# Patient Record
Sex: Male | Born: 1945
Health system: Southern US, Community
[De-identification: ages and names within clinical notes are randomized; demographics above are authoritative.]

## PROBLEM LIST (undated history)

## (undated) DIAGNOSIS — I10 Essential (primary) hypertension: Secondary | ICD-10-CM

## (undated) DIAGNOSIS — K219 Gastro-esophageal reflux disease without esophagitis: Secondary | ICD-10-CM

## (undated) DIAGNOSIS — N182 Chronic kidney disease, stage 2 (mild): Secondary | ICD-10-CM

## (undated) DIAGNOSIS — I2510 Atherosclerotic heart disease of native coronary artery without angina pectoris: Secondary | ICD-10-CM

## (undated) DIAGNOSIS — M1990 Unspecified osteoarthritis, unspecified site: Secondary | ICD-10-CM

## (undated) DIAGNOSIS — N183 Chronic kidney disease, stage 3 unspecified: Secondary | ICD-10-CM

## (undated) DIAGNOSIS — D696 Thrombocytopenia, unspecified: Secondary | ICD-10-CM

## (undated) HISTORY — PX: OTHER SURGICAL HISTORY: SHX169

## (undated) HISTORY — DX: Chronic kidney disease, stage 3 unspecified: N18.30

## (undated) HISTORY — DX: Essential (primary) hypertension: I10

## (undated) HISTORY — PX: CARDIAC CATHETERIZATION: SHX172

---

## 2011-11-25 DIAGNOSIS — R079 Chest pain, unspecified: Secondary | ICD-10-CM

## 2013-05-19 ENCOUNTER — Inpatient Hospital Stay (HOSPITAL_COMMUNITY)
Admission: EM | Admit: 2013-05-19 | Discharge: 2013-05-23 | DRG: 247 | Disposition: A | Discharge status: Home / Self Care | Payer: Medicare Other | Source: Other Acute Inpatient Hospital | Attending: Cardiovascular Disease | Admitting: Cardiovascular Disease

## 2013-05-19 ENCOUNTER — Encounter (HOSPITAL_COMMUNITY): Payer: Self-pay | Admitting: Cardiology

## 2013-05-19 ENCOUNTER — Other Ambulatory Visit: Payer: Self-pay

## 2013-05-19 ENCOUNTER — Encounter (HOSPITAL_COMMUNITY)
Admission: EM | Disposition: A | Discharge status: Home / Self Care | Payer: Medicare Other | Source: Other Acute Inpatient Hospital | Attending: Cardiovascular Disease

## 2013-05-19 ENCOUNTER — Inpatient Hospital Stay (HOSPITAL_COMMUNITY): Payer: Medicare Other

## 2013-05-19 DIAGNOSIS — Z87891 Personal history of nicotine dependence: Secondary | ICD-10-CM

## 2013-05-19 DIAGNOSIS — E782 Mixed hyperlipidemia: Secondary | ICD-10-CM

## 2013-05-19 DIAGNOSIS — N182 Chronic kidney disease, stage 2 (mild): Secondary | ICD-10-CM

## 2013-05-19 DIAGNOSIS — I2111 ST elevation (STEMI) myocardial infarction involving right coronary artery: Secondary | ICD-10-CM

## 2013-05-19 DIAGNOSIS — I129 Hypertensive chronic kidney disease with stage 1 through stage 4 chronic kidney disease, or unspecified chronic kidney disease: Secondary | ICD-10-CM | POA: Diagnosis present

## 2013-05-19 DIAGNOSIS — I213 ST elevation (STEMI) myocardial infarction of unspecified site: Secondary | ICD-10-CM

## 2013-05-19 DIAGNOSIS — N183 Chronic kidney disease, stage 3 unspecified: Secondary | ICD-10-CM | POA: Diagnosis present

## 2013-05-19 DIAGNOSIS — I2119 ST elevation (STEMI) myocardial infarction involving other coronary artery of inferior wall: Principal | ICD-10-CM | POA: Diagnosis present

## 2013-05-19 DIAGNOSIS — Z955 Presence of coronary angioplasty implant and graft: Secondary | ICD-10-CM

## 2013-05-19 DIAGNOSIS — D696 Thrombocytopenia, unspecified: Secondary | ICD-10-CM | POA: Diagnosis not present

## 2013-05-19 DIAGNOSIS — I10 Essential (primary) hypertension: Secondary | ICD-10-CM

## 2013-05-19 DIAGNOSIS — I959 Hypotension, unspecified: Secondary | ICD-10-CM | POA: Diagnosis present

## 2013-05-19 DIAGNOSIS — Z96649 Presence of unspecified artificial hip joint: Secondary | ICD-10-CM

## 2013-05-19 DIAGNOSIS — I2510 Atherosclerotic heart disease of native coronary artery without angina pectoris: Secondary | ICD-10-CM

## 2013-05-19 DIAGNOSIS — E785 Hyperlipidemia, unspecified: Secondary | ICD-10-CM | POA: Diagnosis present

## 2013-05-19 DIAGNOSIS — I252 Old myocardial infarction: Secondary | ICD-10-CM

## 2013-05-19 HISTORY — PX: LEFT HEART CATHETERIZATION WITH CORONARY ANGIOGRAM: SHX5451

## 2013-05-19 HISTORY — PX: CORONARY STENT PLACEMENT: SHX1402

## 2013-05-19 HISTORY — DX: Atherosclerotic heart disease of native coronary artery without angina pectoris: I25.10

## 2013-05-19 HISTORY — DX: Thrombocytopenia, unspecified: D69.6

## 2013-05-19 HISTORY — DX: Chronic kidney disease, stage 2 (mild): N18.2

## 2013-05-19 LAB — COMPREHENSIVE METABOLIC PANEL
ALT: 15 U/L (ref 0–53)
AST: 13 U/L (ref 0–37)
Albumin: 2.2 g/dL — ABNORMAL LOW (ref 3.5–5.2)
Alkaline Phosphatase: 34 U/L — ABNORMAL LOW (ref 39–117)
BUN: 18 mg/dL (ref 6–23)
CHLORIDE: 87 meq/L — AB (ref 96–112)
CO2: 19 mEq/L (ref 19–32)
CREATININE: 1.29 mg/dL (ref 0.50–1.35)
Calcium: 6.6 mg/dL — ABNORMAL LOW (ref 8.4–10.5)
GFR calc Af Amer: 65 mL/min — ABNORMAL LOW (ref >90)
GFR calc non Af Amer: 56 mL/min — ABNORMAL LOW (ref >90)
Glucose, Bld: 99 mg/dL (ref 70–99)
POTASSIUM: 3.7 meq/L (ref 3.7–5.3)
Sodium: 118 mEq/L — CL (ref 137–147)
TOTAL PROTEIN: 4.3 g/dL — AB (ref 6.0–8.3)
Total Bilirubin: 0.5 mg/dL (ref 0.3–1.2)

## 2013-05-19 LAB — PROTIME-INR
INR: 8.85 — AB (ref 0.00–1.49)
INR: 2.44 — AB (ref 0.00–1.49)
Prothrombin Time: 68.7 seconds — ABNORMAL HIGH (ref 11.6–15.2)
Prothrombin Time: 25.7 seconds — ABNORMAL HIGH (ref 11.6–15.2)

## 2013-05-19 LAB — BASIC METABOLIC PANEL
BUN: 17 mg/dL (ref 6–23)
CALCIUM: 8 mg/dL — AB (ref 8.4–10.5)
CHLORIDE: 99 meq/L (ref 96–112)
CO2: 25 mEq/L (ref 19–32)
CREATININE: 1.44 mg/dL — AB (ref 0.50–1.35)
GFR calc non Af Amer: 49 mL/min — ABNORMAL LOW (ref >90)
GFR, EST AFRICAN AMERICAN: 57 mL/min — AB (ref >90)
Glucose, Bld: 121 mg/dL — ABNORMAL HIGH (ref 70–99)
Potassium: 4.3 mEq/L (ref 3.7–5.3)
Sodium: 136 mEq/L — ABNORMAL LOW (ref 137–147)

## 2013-05-19 LAB — CBC
HEMATOCRIT: 37.2 % — AB (ref 39.0–52.0)
Hemoglobin: 12.9 g/dL — ABNORMAL LOW (ref 13.0–17.0)
MCH: 30.9 pg (ref 26.0–34.0)
MCHC: 34.7 g/dL (ref 30.0–36.0)
MCV: 89.2 fL (ref 78.0–100.0)
PLATELETS: 96 10*3/uL — AB (ref 150–400)
RBC: 4.17 MIL/uL — ABNORMAL LOW (ref 4.22–5.81)
RDW: 13.8 % (ref 11.5–15.5)
WBC: 9.6 10*3/uL (ref 4.0–10.5)

## 2013-05-19 LAB — APTT

## 2013-05-19 LAB — CK TOTAL AND CKMB (NOT AT ARMC)
CK TOTAL: 184 U/L (ref 7–232)
CK, MB: 3.2 ng/mL (ref 0.3–4.0)
Relative Index: 1.7 (ref 0.0–2.5)

## 2013-05-19 LAB — MAGNESIUM: Magnesium: 2.1 mg/dL (ref 1.5–2.5)

## 2013-05-19 LAB — HEMOGLOBIN A1C
HEMOGLOBIN A1C: 6.1 % — AB (ref <5.7)
MEAN PLASMA GLUCOSE: 128 mg/dL — AB (ref <117)

## 2013-05-19 LAB — TROPONIN I
Troponin I: <0.3 ng/mL (ref <0.30)
Troponin I: 2.46 ng/mL (ref <0.30)

## 2013-05-19 SURGERY — LEFT HEART CATHETERIZATION WITH CORONARY ANGIOGRAM
Anesthesia: LOCAL

## 2013-05-19 MED ORDER — ZOLPIDEM TARTRATE 5 MG PO TABS
5.0000 mg | ORAL_TABLET | Freq: Once | ORAL | Status: AC
  Administered 2013-05-19: 5 mg via ORAL
  Filled 2013-05-19: qty 1

## 2013-05-19 MED ORDER — ATORVASTATIN CALCIUM 80 MG PO TABS
80.0000 mg | ORAL_TABLET | Freq: Every day | ORAL | Status: DC
  Administered 2013-05-19: 80 mg via ORAL
  Filled 2013-05-19 (×2): qty 1

## 2013-05-19 MED ORDER — SODIUM CHLORIDE 0.9 % IV SOLN
0.2500 mg/kg/h | INTRAVENOUS | Status: DC
  Filled 2013-05-19: qty 250

## 2013-05-19 MED ORDER — TICAGRELOR 90 MG PO TABS
90.0000 mg | ORAL_TABLET | Freq: Two times a day (BID) | ORAL | Status: DC
  Administered 2013-05-19 – 2013-05-20 (×3): 90 mg via ORAL
  Filled 2013-05-19 (×5): qty 1

## 2013-05-19 MED ORDER — MIDAZOLAM HCL 2 MG/2ML IJ SOLN
INTRAMUSCULAR | Status: AC
  Filled 2013-05-19: qty 2

## 2013-05-19 MED ORDER — ASPIRIN EC 81 MG PO TBEC
81.0000 mg | DELAYED_RELEASE_TABLET | Freq: Every day | ORAL | Status: DC
  Administered 2013-05-20: 81 mg via ORAL
  Filled 2013-05-19 (×3): qty 1

## 2013-05-19 MED ORDER — HEPARIN (PORCINE) IN NACL 2-0.9 UNIT/ML-% IJ SOLN
INTRAMUSCULAR | Status: AC
  Filled 2013-05-19: qty 1500

## 2013-05-19 MED ORDER — SODIUM CHLORIDE 0.9 % IV SOLN
INTRAVENOUS | Status: DC
  Administered 2013-05-19: 150 mL/h via INTRAVENOUS
  Administered 2013-05-20: 04:00:00 via INTRAVENOUS

## 2013-05-19 MED ORDER — METOPROLOL TARTRATE 12.5 MG HALF TABLET
12.5000 mg | ORAL_TABLET | Freq: Two times a day (BID) | ORAL | Status: DC
  Administered 2013-05-19 – 2013-05-23 (×8): 12.5 mg via ORAL
  Filled 2013-05-19 (×10): qty 1

## 2013-05-19 MED ORDER — LIDOCAINE HCL (PF) 1 % IJ SOLN
INTRAMUSCULAR | Status: AC
  Filled 2013-05-19: qty 30

## 2013-05-19 MED ORDER — BIVALIRUDIN 250 MG IV SOLR
INTRAVENOUS | Status: AC
  Filled 2013-05-19: qty 250

## 2013-05-19 MED ORDER — NITROGLYCERIN 0.2 MG/ML ON CALL CATH LAB
INTRAVENOUS | Status: AC
  Filled 2013-05-19: qty 1

## 2013-05-19 MED ORDER — TICAGRELOR 90 MG PO TABS
ORAL_TABLET | ORAL | Status: AC
  Administered 2013-05-19: 90 mg via ORAL
  Filled 2013-05-19: qty 2

## 2013-05-19 MED ORDER — FENTANYL CITRATE 0.05 MG/ML IJ SOLN
INTRAMUSCULAR | Status: AC
  Filled 2013-05-19: qty 2

## 2013-05-19 MED ORDER — SODIUM CHLORIDE 0.9 % IV SOLN: 0.2500 mg/kg/h | INTRAVENOUS | Status: AC

## 2013-05-19 MED ORDER — POTASSIUM CHLORIDE 10 MEQ/100ML IV SOLN
INTRAVENOUS | Status: AC
  Filled 2013-05-19: qty 100

## 2013-05-19 MED ORDER — ATROPINE SULFATE 0.1 MG/ML IJ SOLN
INTRAMUSCULAR | Status: AC
  Filled 2013-05-19: qty 10

## 2013-05-19 NOTE — Progress Notes (Signed)
Per MD order and protocol, pt's right femoral arterial sheath removed at 2136. Manual pressure held for 25 minutes with assistance from Hillery Aldo, [place]. Pt tolerated well and vital signs remained stable throughout procedure. Distal pulses +1 before and after sheath-pull. Very minimal oozing observed from site at the beginning; EBL <58mL. Pressure dressing applied and pt given instructions about 4-hr bedrest period, as well as applying pressure to the site if the need arises to sneeze, cough, laugh, etc. Pt verbalized understanding of instructions. Dressing is clean, dry and intact. Will continue to monitor.

## 2013-05-19 NOTE — H&P (Signed)
Keith Keith is an 68 y.o. male.    Primary Cardiologist: [facility] Dr. Claiborne Billings PCP [facility] Dr. Estill Bamberg;  Dr Brigitte Pulse in [place]  Chief Complaint: chest pressure  HPI: 68 year old AAM with no prior cardiac hx but positive for HTN.  Was in his usual state of good health with only abnormality of recent Lt shoulder discomfort, to axilla, thought to be bursitis.  Today after shower at 0930 he had mid sternal chest pressure without radiation associated with nausea, retching, and diaphoresis, no SOB.  His wife took him to [facility] and he was found to have STEMI of inf wall with acute ST segment elevation.  IV Heparin was given and pt transported by EMS to [facility] for emergent cardiac cath.     Past Medical History  Diagnosis Date  . HTN (hypertension)     Past Surgical History  Procedure Laterality Date  . Bil hip replacements      Family History  Problem Relation Age of Onset  . Aneurysm Mother   . Heart disease Sister   . Diabetes Sister   . Diabetes Sister    Social History:  reports that he quit smoking about 10 years ago. He has never used smokeless tobacco. He reports that he drinks about 0.6 ounces of alcohol per week. He reports that he does not use illicit drugs. Married with 3 children and 2 grandchildren and 1 great grand child. He works with [employer].  He golfs but walks beside the cart.  Allergies: No Known Allergies  OUTPATIENT Medications: Lisinopril hctz Unsure of name of others.  Results for orders placed during the hospital encounter of 05/19/13 (from the past 48 hour(s))  CBC     Status: Abnormal   Collection Time    05/19/13  1:28 PM      Result Value Ref Range   WBC 9.6  4.0 - 10.5 K/uL   RBC 4.17 (*) 4.22 - 5.81 MIL/uL   Hemoglobin 12.9 (*) 13.0 - 17.0 g/dL   HCT 37.2 (*) 39.0 - 52.0 %   MCV 89.2  78.0 - 100.0 fL   MCH 30.9  26.0 - 34.0 pg   MCHC 34.7  30.0 - 36.0 g/dL   RDW 13.8  11.5 - 15.5 %   Platelets 96 (*) 150 - 400 K/uL   Comment: PLATELET COUNT CONFIRMED BY SMEAR  COMPREHENSIVE METABOLIC PANEL     Status: Abnormal   Collection Time    05/19/13  1:28 PM      Result Value Ref Range   Sodium 118 (*) 137 - 147 mEq/L   Comment: CRITICAL RESULT CALLED TO, READ BACK BY AND VERIFIED WITH:     BARLOW A.,RN 05/19/13 1436 BY JONESJ   Potassium 3.7  3.7 - 5.3 mEq/L   Chloride 87 (*) 96 - 112 mEq/L   CO2 19  19 - 32 mEq/L   Glucose, Bld 99  70 - 99 mg/dL   BUN 18  6 - 23 mg/dL   Creatinine, Ser 1.29  0.50 - 1.35 mg/dL   Calcium 6.6 (*) 8.4 - 10.5 mg/dL   Total Protein 4.3 (*) 6.0 - 8.3 g/dL   Albumin 2.2 (*) 3.5 - 5.2 g/dL   AST 13  0 - 37 U/L   ALT 15  0 - 53 U/L   Alkaline Phosphatase 34 (*) 39 - 117 U/L   Total Bilirubin 0.5  0.3 - 1.2 mg/dL  GFR calc non Af Amer 56 (*) >90 mL/min   GFR calc Af Amer 65 (*) >90 mL/min   Comment: (NOTE)     The eGFR has been calculated using the CKD EPI equation.     This calculation has not been validated in all clinical situations.     eGFR's persistently <90 mL/min signify possible Chronic Kidney     Disease.  PROTIME-INR     Status: Abnormal   Collection Time    05/19/13  1:28 PM      Result Value Ref Range   Prothrombin Time 68.7 (*) 11.6 - 15.2 seconds   INR 8.85 (*) 0.00 - 1.49   Comment: REPEATED TO VERIFY     CRITICAL RESULT CALLED TO, READ BACK BY AND VERIFIED WITH:     SHELDON D,RN 1506 05/19/13 SCALES H  APTT     Status: Abnormal   Collection Time    05/19/13  1:28 PM      Result Value Ref Range   aPTT >200 (*) 24 - 37 seconds   Comment:            IF BASELINE aPTT IS ELEVATED,     SUGGEST PATIENT RISK ASSESSMENT     BE USED TO DETERMINE APPROPRIATE     ANTICOAGULANT THERAPY.     CRITICAL RESULT CALLED TO, READ BACK BY AND VERIFIED WITH:     SHELDON D,RN 1506 05/19/13 SCALES H  CK TOTAL AND CKMB     Status: None   Collection Time    05/19/13  1:28 PM      Result Value Ref Range   Total CK 184  7 - 232 U/L   CK, MB 3.2  0.3 - 4.0 ng/mL   Relative  Index 1.7  0.0 - 2.5  TROPONIN I     Status: None   Collection Time    05/19/13  1:28 PM      Result Value Ref Range   Troponin I <0.30  <0.30 ng/mL   Comment:            Due to the release kinetics of cTnI,     a negative result within the first hours     of the onset of symptoms does not rule out     myocardial infarction with certainty.     If myocardial infarction is still suspected,     repeat the test at appropriate intervals.   No results found.  ROS: General:no colds or fevers, no weight changes Skin:no rashes or ulcers HEENT:no blurred vision, no congestion CV:see HPI PUL:see HPI GI:no diarrhea constipation or melena, no indigestion GU:no hematuria, no dysuria MS:no joint pain, no claudication Neuro:no syncope, no lightheadedness Endo:no diabetes, no thyroid disease  Blood pressure 102/62, pulse 82, resp. rate 14, height '5\' 5"'  (1.651 m), weight 158 lb 11.7 oz (72 kg), SpO2 100.00%. PE: General:Pleasant affect, NAD Skin:Warm and dry, brisk capillary refill HEENT:normocephalic, sclera clear, mucus membranes moist Neck:supple, no JVD, no bruits, adenopathy  Heart:S1S2 RRR without murmur, gallup, rub or click Lungs:clear without rales, rhonchi, or wheezes CVE:LFYB, non tender, + BS, do not palpate liver spleen or masses Ext:no lower ext edema, 1+ pedal pulses, 2+ radial pulses Neuro:alert and oriented, MAE, follows commands, + facial symmetry    Assessment/Plan Principal Problem:   ST elevation myocardial infarction (STEMI) involving right coronary artery with complication Active Problems:   HTN (hypertension)   CAD (coronary artery disease)  PLAN:  Admit to CCU after procedure.  Add BB con't ace as BP allows.  Check chol.  Add statin.  Serial troponin  [facility] Nurse Practitioner Certified [place] Pager [phone] or after 5pm or weekends call [phone] 05/19/2013, 3:57 PM   Patient seen and examined. Agree with assessment and  plan. 68 yo AAM who has a history of hypertension and was getting ready to play golf today when he developed tachypnea and substernal chest pressure. He presented to [facility] and was found to have inferior ST segment elevation of 2-3 mm. He was mildly hypotensive. Right-sided ECG leads did not suggest RV infarction pattern. He was treated with normal saline fluid and was given heparin 5000 units prior to transport. Upon arrival to [facility] today, he still has chest pain but this has improved. He's taken acutely to the catheterization laboratory for acute catheterization with suspicion for probable high-grade RCA disease in the etiology of his ST segment elevation inferior myocardial infarction   Troy Sine, MD, [facility] 05/19/2013 4:32 PM

## 2013-05-19 NOTE — CV Procedure (Addendum)
Keith Keith is a 68 y.o. male    [id number]  [id number] LOCATION:  FACILITY: [facility]  PHYSICIAN: Lennette Bihari, MD, [facility] 1945-07-28   DATE OF PROCEDURE:  05/19/2013    EMERGENCYCARDIAC CATHETERIZATION/PERCUTANEOUS CORONARY INTERVENTION    HISTORY: Keith Keith is a 68 year old African American male who has a history of hypertension and BPH. He developed new onset chest pain today and presented to [facility] for ECG showed acute inferior ST segment elevation.  A code STEMI was called, heparin 5000 untits was administered and he was transported to [facility] for emergent cardiac catheterization.   PROCEDURE: Emergent cardiac catheterization, coronary angiography, as well as cardiac intervention with PTCA at 4 sites of his RCA with tandem stenting and left ventriculography.   Upon arrival to the cardiac catheterization laboratory, the patient was still having residual chest pain but this had improved following administration of heparin prior to transfer. His right femoral artery was punctured anteriorly and a 6 French sheath was inserted without difficulty. First at 2 mg and fentanyl 50 mcg were administered for conscious sedation. A J40 diagnostic catheter was used for selective angiography into the left coronary system. Initially, a 6 [place] 4 guide was inserted but due to significant catheter-induced dampening of pressure, this was exchanged for a 6 [place] JR 4 with side hole guiding catheter. The demonstration of multiple high grade stenoses in the RCA the decision was made to attempt emergent percutaneous coronary intervention. Bivalirudin bolus plus infusion was administered. The patient received 180 mg of oral Brilinta. A Prowater wire was advanced down the RCA into the posterolateral vessel. A 2.0x15 mm sprinter legend balloon was inserted. As to the distal RCA and multiple inflations were made at 3 distinct sites in the in the distal region, just after the acute margin, and  in the mid ICA proximal to the acute margin. Tandem stenting was performed with insertion of a 2.25x32 mm Promus Premier  DES stent inserted distally and a second stent 2.25x20 mm Promus Premier DES stent was inserted and tandem fashion extending to the mid RCA. Post-stent dilatation was done using an Empira Trent 2.5 x 20 mm balloon with tapering from 2.4 mm in the mid RCA tooth 2.32 mm and the most distal aspect of the second stent. Scout angiography confirmed an excellent angiographic result. The balloon wire and RCA guide were removed and a 6 French pigtail catheter was inserted. Left ventriculography was performed using 25 cc of Omnipaque contrast at a flow rate of 12 cc per second. Throughout the procedure, the patient was hypotensive and he did receive fluid bolus. He also was started on KCL 10 mEq infusion when he was found to be hypokalemic with potassium of 3.0 The arterial sheath was sutured in place. Plans are for continuation of Angiomax for 4 hours post procedure.  Hhe left the catheterization laboratory chest pain-free with stable hemodynamics.   HEMODYNAMICS:   Central Aorta: 85/60   Left Ventricle: 85/10  ANGIOGRAPHY:  The left main coronary artery was angiographically normal vessel which trifurcated into a moderate size LAD, a diminutive ramus intermediate vessel, and a moderate-sized circumflex vessel.  The LAD gave rise to a proximal diagonal vessel that had 80% ostial stenosis. The superior branch of this diagonal vessel had diffuse MID narrowing of 89% and a inferior branch of this diagonal vessel had 60% narrowing. There was diffuse 60-70% narrowing in the LAD after the diagonal takeoff. The LAD extended to and wrapped around the LV apex.  The  ramus intermediate vessel was a small vessel that had diffuse 95% ostial narrowing.  The circumflex vessel gave rise to 2 major marginal vessels. The first marginal vessel had an inferior branch which was very small caliber but had 90%  stenosis.  Right carotid artery was a small to moderate size vessel that had proximal narrowing of 30% beyond the initial bend. In the mid region there was 70% stenosis followed by 90% stenosis before the acute margin, and just after the acute margin the vessel is 95-99% stenosed followed by 80-90% stenosed before the PDA.  Following percutaneous coronary intervention with PTCA diffusely from the mid RCA distally and with ultimate tandem stenting with 2.25x32 mm and 2.25x20 mm Promus Premier DES stents, post dilated with stent taper from 2.4 mm at the mid segment to 2.32 mm distally, the entire region was reduced to 0%. There was brisk TIMI-3 flow. There was no evidence for dissection. The door to balloon time was 30 minutes.  Left ventriculography revealed normal systolic function with an ejection fraction of at least 60-65% without focal segmental wall motion abnormalities.  IMPRESSION:  Acute inferior ST segment elevation myocardial infarction secondary to high grade diffuse mid distal RCA disease with subtotal RCA stenosis.  Concomitant coronary obstructive disease involving the LAD with 80% focal ostial stenosis in the first diagonal branch with diffuse 80-90% stenosis in the superior aspect of this diagonal branch and 60% stenosis in the inferior portion of this branch followed by 60-70% LAD stenoses after the first out of vessel; 95% stenosis of the very small diminutive ramus intermediate vessel; and 90% stenosis in an inferior branch of the OM1 vessel the left circumflex coronary artery.  Successful acute intervention to the RCA with insertion of Promus Premier 2.25x32 and 2.25x20 mm stents postdilated to 2.4 mm down to 2.32 mm from the mid segment distally with excellent door to balloon time of 30 minutes and stenoses being reduced to 0%.  Normal acute LV function with an ejection fraction of 60-65% without focal segmental wall motion abnormalities.    RECOMMENDATION:   Keith Keith  developed new onset chest pain today associated with 2-3 mm of inferior ST segment elevation. [facility] and [facility] his chest pain was still present but improved. Initial angiography confirmed high-grade multiple stenoses in the RCA which is the culprit vessel with subtotal stenosis but with TIMI-3 flow. Following successful emergent revascularization, only function appears fairly normal. He does have concomitant CAD. He will  be started on medical therapy.   Lennette Biharihomas A. Kelly, MD, [facility] 05/19/2013 2:09 PM

## 2013-05-20 ENCOUNTER — Encounter (HOSPITAL_COMMUNITY): Payer: Self-pay | Admitting: Interventional Cardiology

## 2013-05-20 DIAGNOSIS — D696 Thrombocytopenia, unspecified: Secondary | ICD-10-CM | POA: Diagnosis present

## 2013-05-20 DIAGNOSIS — E782 Mixed hyperlipidemia: Secondary | ICD-10-CM

## 2013-05-20 LAB — CBC
HCT: 40.7 % (ref 39.0–52.0)
Hemoglobin: 14 g/dL (ref 13.0–17.0)
MCH: 31 pg (ref 26.0–34.0)
MCHC: 34.4 g/dL (ref 30.0–36.0)
MCV: 90.2 fL (ref 78.0–100.0)
PLATELETS: 104 10*3/uL — AB (ref 150–400)
RBC: 4.51 MIL/uL (ref 4.22–5.81)
RDW: 14.2 % (ref 11.5–15.5)
WBC: 7.5 10*3/uL (ref 4.0–10.5)

## 2013-05-20 LAB — PROTIME-INR
INR: 1.11 (ref 0.00–1.49)
PROTHROMBIN TIME: 14.1 s (ref 11.6–15.2)

## 2013-05-20 LAB — URINALYSIS, ROUTINE W REFLEX MICROSCOPIC
Bilirubin Urine: NEGATIVE
GLUCOSE, UA: NEGATIVE mg/dL
HGB URINE DIPSTICK: NEGATIVE
KETONES UR: NEGATIVE mg/dL
Leukocytes, UA: NEGATIVE
Nitrite: NEGATIVE
PH: 6 (ref 5.0–8.0)
Protein, ur: NEGATIVE mg/dL
Specific Gravity, Urine: 1.018 (ref 1.005–1.030)
Urobilinogen, UA: 1 mg/dL (ref 0.0–1.0)

## 2013-05-20 LAB — LIPID PANEL
CHOL/HDL RATIO: 3 ratio
CHOLESTEROL: 128 mg/dL (ref 0–200)
HDL: 42 mg/dL (ref >39)
LDL Cholesterol: 68 mg/dL (ref 0–99)
Triglycerides: 92 mg/dL (ref <150)
VLDL: 18 mg/dL (ref 0–40)

## 2013-05-20 LAB — COMPREHENSIVE METABOLIC PANEL
ALT: 18 U/L (ref 0–53)
AST: 22 U/L (ref 0–37)
Albumin: 2.7 g/dL — ABNORMAL LOW (ref 3.5–5.2)
Alkaline Phosphatase: 43 U/L (ref 39–117)
BUN: 16 mg/dL (ref 6–23)
CALCIUM: 8.1 mg/dL — AB (ref 8.4–10.5)
CO2: 25 mEq/L (ref 19–32)
CREATININE: 1.47 mg/dL — AB (ref 0.50–1.35)
Chloride: 101 mEq/L (ref 96–112)
GFR calc Af Amer: 56 mL/min — ABNORMAL LOW (ref >90)
GFR, EST NON AFRICAN AMERICAN: 48 mL/min — AB (ref >90)
Glucose, Bld: 101 mg/dL — ABNORMAL HIGH (ref 70–99)
Potassium: 4.4 mEq/L (ref 3.7–5.3)
SODIUM: 138 meq/L (ref 137–147)
Total Bilirubin: 0.6 mg/dL (ref 0.3–1.2)
Total Protein: 5.6 g/dL — ABNORMAL LOW (ref 6.0–8.3)

## 2013-05-20 LAB — POCT I-STAT, CHEM 8
BUN: 18 mg/dL (ref 6–23)
CREATININE: 0.7 mg/dL (ref 0.50–1.35)
Calcium, Ion: 0.92 mmol/L — ABNORMAL LOW (ref 1.13–1.30)
Chloride: 71 mEq/L — ABNORMAL LOW (ref 96–112)
GLUCOSE: 62 mg/dL — AB (ref 70–99)
HCT: 40 % (ref 39.0–52.0)
Hemoglobin: 13.6 g/dL (ref 13.0–17.0)
Potassium: 2.9 mEq/L — CL (ref 3.7–5.3)
SODIUM: 105 meq/L — AB (ref 137–147)
TCO2: 16 mmol/L (ref 0–100)

## 2013-05-20 LAB — HEMOGLOBIN A1C
Hgb A1c MFr Bld: 6 % — ABNORMAL HIGH (ref <5.7)
Mean Plasma Glucose: 126 mg/dL — ABNORMAL HIGH (ref <117)

## 2013-05-20 LAB — POCT ACTIVATED CLOTTING TIME: Activated Clotting Time: 692 seconds

## 2013-05-20 LAB — TROPONIN I: Troponin I: 2.8 ng/mL (ref <0.30)

## 2013-05-20 LAB — TSH
TSH: 0.575 u[IU]/mL (ref 0.350–4.500)
TSH: 0.326 u[IU]/mL — AB (ref 0.350–4.500)

## 2013-05-20 MED ORDER — ATORVASTATIN CALCIUM 40 MG PO TABS
40.0000 mg | ORAL_TABLET | Freq: Every day | ORAL | Status: DC
  Administered 2013-05-20 – 2013-05-22 (×3): 40 mg via ORAL
  Filled 2013-05-20 (×5): qty 1

## 2013-05-20 MED ORDER — ACETAMINOPHEN 325 MG PO TABS
650.0000 mg | ORAL_TABLET | Freq: Four times a day (QID) | ORAL | Status: DC | PRN
  Administered 2013-05-20 – 2013-05-21 (×2): 650 mg via ORAL
  Filled 2013-05-20 (×2): qty 2

## 2013-05-20 MED FILL — Sodium Chloride IV Soln 0.9%: INTRAVENOUS | Qty: 50 | Status: AC

## 2013-05-20 NOTE — Progress Notes (Addendum)
SUBJECTIVE:  No chest pain.  Feels well.  Wants to go home.  OBJECTIVE:   Vitals:   Filed Vitals:   05/20/13 0500 05/20/13 0600 05/20/13 0700 05/20/13 0800  BP: 128/63 98/49 112/68 113/60  Pulse: 84 81 70 81  Temp:      TempSrc:      Resp: 17 15 13 15   Height:      Weight:      SpO2: 100% 99% 100% 99%   I&O's:   Intake/Output Summary (Last 24 hours) at 05/20/13 0910 Last data filed at 05/20/13 0800  Gross per 24 hour  Intake   3074 ml  Output   2625 ml  Net    449 ml   TELEMETRY: Reviewed telemetry pt in NSR:     PHYSICAL EXAM General: Well developed, well nourished, in no acute distress Head:    Normal cephalic and atramatic  Lungs:   Clear bilaterally to auscultation and percussion. Heart:   HRRR S1 S2 No JVD.  No abdominal bruits. No femoral bruits. Abdomen:  abdomen soft and non-tender Msk:  Back normal, . Normal strength and tone for age. Extremities:  No edema.  Right PT 2+, no right groin hematoma Neuro: Alert and oriented X 3. Psych:  Normal affect, responds appropriately   LABS: Basic Metabolic Panel:  Recent Labs  16/11/9601/29/15 1328 05/19/13 1707 05/20/13 0047  NA 118* 136* 138  K 3.7 4.3 4.4  CL 87* 99 101  CO2 19 25 25   GLUCOSE 99 121* 101*  BUN 18 17 16   CREATININE 1.29 1.44* 1.47*  CALCIUM 6.6* 8.0* 8.1*  MG  --  2.1  --    Liver Function Tests:  Recent Labs  05/19/13 1328 05/20/13 0047  AST 13 22  ALT 15 18  ALKPHOS 34* 43  BILITOT 0.5 0.6  PROT 4.3* 5.6*  ALBUMIN 2.2* 2.7*   No results found for this basename: LIPASE, AMYLASE,  in the last 72 hours CBC:  Recent Labs  05/19/13 1328 05/20/13 0047  WBC 9.6 7.5  HGB 12.9* 14.0  HCT 37.2* 40.7  MCV 89.2 90.2  PLT 96* 104*   Cardiac Enzymes:  Recent Labs  05/19/13 1328 05/19/13 2050 05/20/13 0047  CKTOTAL 184  --   --   CKMB 3.2  --   --   TROPONINI <0.30 2.46* 2.80*   BNP: No components found with this basename: POCBNP,  D-Dimer: No results found for this  basename: DDIMER,  in the last 72 hours Hemoglobin A1C:  Recent Labs  05/19/13 1707  HGBA1C 6.0*   Fasting Lipid Panel:  Recent Labs  05/20/13 0047  CHOL 128  HDL 42  LDLCALC 68  TRIG 92  CHOLHDL 3.0   Thyroid Function Tests:  Recent Labs  05/19/13 1707  TSH 0.326*   Anemia Panel: No results found for this basename: VITAMINB12, FOLATE, FERRITIN, TIBC, IRON, RETICCTPCT,  in the last 72 hours Coag Panel:   Lab Results  Component Value Date   INR 1.11 05/20/2013   INR 2.44* 05/19/2013   INR 8.85* 05/19/2013    RADIOLOGY: No results found.    ASSESSMENT: Acute inferior MI; residual moderate CAD; HTN  PLAN:  Stressed the importance fo DAPT with aspirin and brilinta.  Medical therapy per Dr. Tresa EndoKelly for residual CAD.  Not adding any further antianginal therapy for now since he is asymptomatic.  Walk with cardiac rehab.  HTN: OK to stay on current home BP meds. Was on lisinopril and lopressor  at home per the family.  Transfer to telel today.  Possible d/c tomorrow or the day after depending on his clinical course.  Hyperlipidemia: Decrease atorva to 40 mg daily.  LDL 68.   Thrombocytopenia: watch for bleeding.  Corky Crafts., MD  05/20/2013  9:10 AM

## 2013-05-20 NOTE — Care Management Note (Addendum)
    Page 1 of 1   05/23/2013     10:11:06 AM   CARE MANAGEMENT NOTE 05/23/2013  Patient:  Keith Keith,Keith Keith   Account Number:  [id number]  Date Initiated:  05/20/2013  Documentation initiated by:  Junius Creamer  Subjective/Objective Assessment:   adm w stemi     Action/Plan:   lives w spouse//Benefits check for Brilinta   Anticipated DC Date:  05/23/2013   Anticipated DC Plan:  HOME/SELF CARE      DC Planning Services  CM consult  Medication Assistance      Choice offered to / List presented to:             Status of service:  Completed, signed off Medicare Important Message given?   (If response is "NO", the following Medicare IM given date fields will be blank) Date Medicare IM given:   Date Additional Medicare IM given:    Discharge Disposition:    Per UR Regulation:  Reviewed for med. necessity/level of care/duration of stay  If discussed at Long Length of Stay Meetings, dates discussed:    Comments:  05/23/12 0845 Oletta Cohn, RN, BSN, NCM [phone] Spoke with pt at bedside regarding benefits check for Brilinta.  Pt has brochure with 30 day free card and refill assistance card intact.  Pt utilizes [facility] in [place], [place] for prescription needs.  NCM called pharmacy to confirm availability of medication.  Information relayed to pt.  Pt verbalizes importance of filling medication upon discharge.  3/30 1118 debbie dowell rn,bsn spoke w pt. gave him 30day free brilinta card. pt gets meds from [facility]

## 2013-05-20 NOTE — Progress Notes (Signed)
NT reported pt had a temp of 101.2. RN paged MD. Orders given for 650 mg of Tylenol Q6H PRN and a UA. Pt.currently resting in bed and call bell within reach. Will continue to monitor.   Genevive Bi, RN

## 2013-05-20 NOTE — Progress Notes (Signed)
CARDIAC REHAB PHASE I   PRE:  Rate/Rhythm: 92 SR    BP: sitting 123/77    SaO2: 100 RA  MODE:  Ambulation: 350 ft   POST:  Rate/Rhythm: 98 SR    BP: sitting 123/74     SaO2: 100 RA  Tolerated well. Feels good. Began ed with wife present. Voiced understanding. Wants to know when he can play golf. Gave diet sheet and will f/u in am for rest of education.  [id number]   Elissa Lovett Golden CES, ACSM 05/20/2013 2:28 PM

## 2013-05-21 LAB — PROTIME-INR
INR: 1 (ref 0.00–1.49)
Prothrombin Time: 13 seconds (ref 11.6–15.2)

## 2013-05-21 LAB — BASIC METABOLIC PANEL
BUN: 16 mg/dL (ref 6–23)
CO2: 25 meq/L (ref 19–32)
Calcium: 9.4 mg/dL (ref 8.4–10.5)
Chloride: 103 mEq/L (ref 96–112)
Creatinine, Ser: 1.58 mg/dL — ABNORMAL HIGH (ref 0.50–1.35)
GFR calc Af Amer: 51 mL/min — ABNORMAL LOW (ref >90)
GFR calc non Af Amer: 44 mL/min — ABNORMAL LOW (ref >90)
GLUCOSE: 104 mg/dL — AB (ref 70–99)
Potassium: 5.2 mEq/L (ref 3.7–5.3)
SODIUM: 141 meq/L (ref 137–147)

## 2013-05-21 LAB — POCT I-STAT, CHEM 8
BUN: 18 mg/dL (ref 6–23)
CALCIUM ION: 0.96 mmol/L — AB (ref 1.13–1.30)
Chloride: 80 mEq/L — ABNORMAL LOW (ref 96–112)
Creatinine, Ser: 0.8 mg/dL (ref 0.50–1.35)
Glucose, Bld: 76 mg/dL (ref 70–99)
HCT: 40 % (ref 39.0–52.0)
HEMOGLOBIN: 13.6 g/dL (ref 13.0–17.0)
POTASSIUM: 3 meq/L — AB (ref 3.7–5.3)
Sodium: 115 mEq/L — CL (ref 137–147)
TCO2: 18 mmol/L (ref 0–100)

## 2013-05-21 MED ORDER — SODIUM CHLORIDE 0.9 % IJ SOLN: 3.0000 mL | INTRAMUSCULAR | Status: DC | PRN

## 2013-05-21 MED ORDER — SODIUM CHLORIDE 0.9 % IJ SOLN
3.0000 mL | Freq: Two times a day (BID) | INTRAMUSCULAR | Status: DC
  Administered 2013-05-21: 3 mL via INTRAVENOUS

## 2013-05-21 MED ORDER — SODIUM CHLORIDE 0.9 % IV SOLN: 250.0000 mL | INTRAVENOUS | Status: DC | PRN

## 2013-05-21 MED ORDER — ASPIRIN 81 MG PO CHEW
81.0000 mg | CHEWABLE_TABLET | ORAL | Status: AC
Start: 2013-05-22 — End: 2013-05-22
  Administered 2013-05-22: 81 mg via ORAL
  Filled 2013-05-21: qty 1

## 2013-05-21 MED ORDER — SODIUM CHLORIDE 0.9 % IV SOLN
1.0000 mL/kg/h | INTRAVENOUS | Status: DC
  Administered 2013-05-21 – 2013-05-22 (×2): 1 mL/kg/h via INTRAVENOUS

## 2013-05-21 MED ORDER — HEART ATTACK BOUNCING BOOK
Freq: Once | Status: AC
Start: 2013-05-21 — End: 2013-05-21
  Administered 2013-05-21: 18:00:00
  Filled 2013-05-21 (×3): qty 1

## 2013-05-21 NOTE — Progress Notes (Signed)
SUBJECTIVE:  No chest pain.  No SOB.     PHYSICAL EXAM Filed Vitals:   05/20/13 1400 05/20/13 1546 05/20/13 2050 05/21/13 0611  BP: 123/77 135/87 107/74 105/75  Pulse: 92  93 96  Temp:  99.3 F (37.4 C) 101.2 F (38.4 C) 99 F (37.2 C)  TempSrc:  Oral Oral Oral  Resp: 17  16 18   Height:      Weight:      SpO2: 100% 97% 98% 96%   General:  No distress Lungs:  Clear Heart:  RRR Abdomen:  Positive bowel sounds, no rebound no guarding Extremities:  No edema, right groin echymosis Neuro:  Nonfocal  LABS:   Results for orders placed during the hospital encounter of 05/19/13 (from the past 24 hour(s))  URINALYSIS, ROUTINE W REFLEX MICROSCOPIC     Status: None   Collection Time    05/20/13  9:32 PM      Result Value Ref Range   Color, Urine YELLOW  YELLOW   APPearance CLEAR  CLEAR   Specific Gravity, Urine 1.018  1.005 - 1.030   pH 6.0  5.0 - 8.0   Glucose, UA NEGATIVE  NEGATIVE mg/dL   Hgb urine dipstick NEGATIVE  NEGATIVE   Bilirubin Urine NEGATIVE  NEGATIVE   Ketones, ur NEGATIVE  NEGATIVE mg/dL   Protein, ur NEGATIVE  NEGATIVE mg/dL   Urobilinogen, UA 1.0  0.0 - 1.0 mg/dL   Nitrite NEGATIVE  NEGATIVE   Leukocytes, UA NEGATIVE  NEGATIVE  PROTIME-INR     Status: None   Collection Time    05/21/13  5:20 AM      Result Value Ref Range   Prothrombin Time 13.0  11.6 - 15.2 seconds   INR 1.00  0.00 - 1.49  BASIC METABOLIC PANEL     Status: Abnormal   Collection Time    05/21/13  5:20 AM      Result Value Ref Range   Sodium 141  137 - 147 mEq/L   Potassium 5.2  3.7 - 5.3 mEq/L   Chloride 103  96 - 112 mEq/L   CO2 25  19 - 32 mEq/L   Glucose, Bld 104 (*) 70 - 99 mg/dL   BUN 16  6 - 23 mg/dL   Creatinine, Ser 6.961.58 (*) 0.50 - 1.35 mg/dL   Calcium 9.4  8.4 - 29.510.5 mg/dL   GFR calc non Af Amer 44 (*) >90 mL/min   GFR calc Af Amer 51 (*) >90 mL/min    Intake/Output Summary (Last 24 hours) at 05/21/13 [id number] Last data filed at 05/20/13 1630  Gross per 24 hour    Intake    520 ml  Output    500 ml  Net     20 ml    ASSESSMENT AND PLAN:  NQWMI:  Status post multiple stents to the RCA.  I reviewed the cath films today.  He has a high grade lesion in a large OM.   This appears to be 99% in one view.  PCI is indicated.  Discussed with patient and wife.  They agree.  The patient understands that risks included but are not limited to stroke (1 in 1000), death (1 in 1000), kidney failure [usually temporary] (1 in 500), bleeding (1 in 200), allergic reaction [possibly serious] (1 in 200).  The patient understands and agrees to proceed.   He wants to be sedated for this procedure.  I put him on the board for the am.  ELEVATED CREAT:   I will hydrate and check BMET in the AM.  HTN:  BP is running low.  Continue current meds.   Fayrene Fearing [facility] 05/21/2013 9:09 AM

## 2013-05-21 NOTE — Progress Notes (Signed)
CARDIAC REHAB PHASE I   PRE:  Rate/Rhythm: 96 SR  BP:  Supine:   Sitting: 141/73  Standing:    SaO2: 98%RA  MODE:  Ambulation: 550 ft   POST:  Rate/Rhythm: 97SR  BP:  Supine:   Sitting: 144/75  Standing:    SaO2: 100%RA 1000-1055 Pt walked 550 ft on RA with steady gait. Tolerated well. No CP. Completed MI education except exercise. Will complete ex ed after staged PCI. Discussed CRP 2 and pt gave permission to refer to [place] Phase 2.    Luetta Nutting, RN BSN  05/21/2013 10:50 AM

## 2013-05-22 ENCOUNTER — Encounter (HOSPITAL_COMMUNITY)
Admission: EM | Disposition: A | Discharge status: Home / Self Care | Payer: Medicare Other | Source: Other Acute Inpatient Hospital | Attending: Cardiovascular Disease

## 2013-05-22 ENCOUNTER — Other Ambulatory Visit: Payer: Self-pay

## 2013-05-22 DIAGNOSIS — I2510 Atherosclerotic heart disease of native coronary artery without angina pectoris: Secondary | ICD-10-CM

## 2013-05-22 DIAGNOSIS — I2109 ST elevation (STEMI) myocardial infarction involving other coronary artery of anterior wall: Secondary | ICD-10-CM

## 2013-05-22 HISTORY — PX: FRACTIONAL FLOW RESERVE WIRE: SHX5839

## 2013-05-22 HISTORY — PX: PERCUTANEOUS CORONARY STENT INTERVENTION (PCI-S): SHX5485

## 2013-05-22 LAB — BASIC METABOLIC PANEL
BUN: 18 mg/dL (ref 6–23)
CALCIUM: 9.1 mg/dL (ref 8.4–10.5)
CO2: 21 mEq/L (ref 19–32)
Chloride: 102 mEq/L (ref 96–112)
Creatinine, Ser: 1.48 mg/dL — ABNORMAL HIGH (ref 0.50–1.35)
GFR calc Af Amer: 55 mL/min — ABNORMAL LOW (ref >90)
GFR, EST NON AFRICAN AMERICAN: 48 mL/min — AB (ref >90)
Glucose, Bld: 88 mg/dL (ref 70–99)
Potassium: 4.7 mEq/L (ref 3.7–5.3)
SODIUM: 136 meq/L — AB (ref 137–147)

## 2013-05-22 LAB — PROTIME-INR
INR: 1 (ref 0.00–1.49)
PROTHROMBIN TIME: 13 s (ref 11.6–15.2)

## 2013-05-22 LAB — POCT ACTIVATED CLOTTING TIME: Activated Clotting Time: 459 seconds

## 2013-05-22 SURGERY — PERCUTANEOUS CORONARY STENT INTERVENTION (PCI-S)
Anesthesia: LOCAL | Site: Hand | Laterality: Right

## 2013-05-22 MED ORDER — SODIUM CHLORIDE 0.9 % IV SOLN
INTRAVENOUS | Status: AC
  Administered 2013-05-22: 17:00:00 via INTRAVENOUS

## 2013-05-22 MED ORDER — DIAZEPAM 5 MG PO TABS
5.0000 mg | ORAL_TABLET | ORAL | Status: AC
Start: 2013-05-22 — End: 2013-05-22
  Administered 2013-05-22: 5 mg via ORAL
  Filled 2013-05-22: qty 1

## 2013-05-22 MED ORDER — ASPIRIN 81 MG PO CHEW
81.0000 mg | CHEWABLE_TABLET | Freq: Every day | ORAL | Status: DC
  Administered 2013-05-23: 10:00:00 81 mg via ORAL
  Filled 2013-05-22: qty 1

## 2013-05-22 MED ORDER — TICAGRELOR 90 MG PO TABS
ORAL_TABLET | ORAL | Status: AC
Start: 2013-05-22 — End: 2013-05-22
  Administered 2013-05-22: 21:00:00 90 mg via ORAL
  Filled 2013-05-22: qty 1

## 2013-05-22 MED ORDER — VERAPAMIL HCL 2.5 MG/ML IV SOLN
INTRAVENOUS | Status: AC
  Filled 2013-05-22: qty 2

## 2013-05-22 MED ORDER — TICAGRELOR 90 MG PO TABS
90.0000 mg | ORAL_TABLET | Freq: Two times a day (BID) | ORAL | Status: DC
  Administered 2013-05-22 – 2013-05-23 (×2): 90 mg via ORAL
  Filled 2013-05-22 (×3): qty 1

## 2013-05-22 MED ORDER — HEPARIN SODIUM (PORCINE) 1000 UNIT/ML IJ SOLN
INTRAMUSCULAR | Status: AC
  Filled 2013-05-22: qty 1

## 2013-05-22 MED ORDER — HEPARIN (PORCINE) IN NACL 2-0.9 UNIT/ML-% IJ SOLN
INTRAMUSCULAR | Status: AC
  Filled 2013-05-22: qty 500

## 2013-05-22 MED ORDER — NITROGLYCERIN 0.2 MG/ML ON CALL CATH LAB
INTRAVENOUS | Status: AC
  Filled 2013-05-22: qty 1

## 2013-05-22 MED ORDER — HEPARIN (PORCINE) IN NACL 2-0.9 UNIT/ML-% IJ SOLN
INTRAMUSCULAR | Status: AC
  Filled 2013-05-22: qty 1000

## 2013-05-22 MED ORDER — TICAGRELOR 90 MG PO TABS
ORAL_TABLET | ORAL | Status: AC
  Filled 2013-05-22: qty 1

## 2013-05-22 MED ORDER — LIDOCAINE HCL (PF) 1 % IJ SOLN
INTRAMUSCULAR | Status: AC
  Filled 2013-05-22: qty 30

## 2013-05-22 MED ORDER — HEART ATTACK BOUNCING BOOK
Freq: Once | Status: AC
  Administered 2013-05-22: 23:00:00
  Filled 2013-05-22: qty 1

## 2013-05-22 MED ORDER — FENTANYL CITRATE 0.05 MG/ML IJ SOLN
INTRAMUSCULAR | Status: AC
  Filled 2013-05-22: qty 2

## 2013-05-22 MED ORDER — MIDAZOLAM HCL 2 MG/2ML IJ SOLN
INTRAMUSCULAR | Status: AC
  Filled 2013-05-22: qty 2

## 2013-05-22 MED ORDER — ADENOSINE 12 MG/4ML IV SOLN
16.0000 mL | Freq: Once | INTRAVENOUS | Status: DC
Start: 2013-05-22 — End: 2013-05-22
  Filled 2013-05-22: qty 16

## 2013-05-22 NOTE — Progress Notes (Signed)
    SUBJECTIVE:  Denies any chest pain. No SOB.    PHYSICAL EXAM Filed Vitals:   05/21/13 0611 05/21/13 1359 05/21/13 1955 05/22/13 0532  BP: 105/75 117/76 118/76 128/76  Pulse: 96 87 93 74  Temp: 99 F (37.2 C) 98.6 F (37 C) 98.1 F (36.7 C) 99 F (37.2 C)  TempSrc: Oral Oral Oral Oral  Resp: 18 18 18 18   Height:      Weight:      SpO2: 96% 100% 98% 100%   General:  No distress Lungs:  Clear Heart:  RRR Abdomen:  Positive bowel sounds, no rebound no guarding Extremities:  No edema, right groin echymosis   LABS:   Results for orders placed during the hospital encounter of 05/19/13 (from the past 24 hour(s))  PROTIME-INR     Status: None   Collection Time    05/22/13  4:20 AM      Result Value Ref Range   Prothrombin Time 13.0  11.6 - 15.2 seconds   INR 1.00  0.00 - 1.49  BASIC METABOLIC PANEL     Status: Abnormal   Collection Time    05/22/13  4:20 AM      Result Value Ref Range   Sodium 136 (*) 137 - 147 mEq/L   Potassium 4.7  3.7 - 5.3 mEq/L   Chloride 102  96 - 112 mEq/L   CO2 21  19 - 32 mEq/L   Glucose, Bld 88  70 - 99 mg/dL   BUN 18  6 - 23 mg/dL   Creatinine, Ser 8.76 (*) 0.50 - 1.35 mg/dL   Calcium 9.1  8.4 - 81.1 mg/dL   GFR calc non Af Amer 48 (*) >90 mL/min   GFR calc Af Amer 55 (*) >90 mL/min    Intake/Output Summary (Last 24 hours) at 05/22/13 5726 Last data filed at 05/22/13 0259  Gross per 24 hour  Intake    600 ml  Output   1700 ml  Net  -1100 ml    ASSESSMENT AND PLAN:  NSTEMI  Plan for repeat LHC today for planned PCI on the OM vessel. INR is WNL.   ELEVATED CREAT:   Slight improvement overnight with IVFs. Scr is 1.48 today. He will need further hydration post cath.   HTN:  Stable today. Most recent was 128/76.   [name], [name] 05/22/2013 9:38 AM  History and all data above reviewed.  Patient examined.  I agree with the findings as above.   The patient exam reveals COR:RRR  ,  Lungs: Clear  ,  Abd: Positive bowel sounds,  no rebound no guarding, Ext No edema  .  All available labs, radiology testing, previous records reviewed. Agree with documented assessment and plan. CAD:  Plan for PCI today with Dr. Fuller Song Niti Leisure  11:59 AM  05/22/2013

## 2013-05-22 NOTE — Progress Notes (Signed)
TR BAND REMOVAL  LOCATION:    right radial  DEFLATED PER PROTOCOL:    yes  TIME BAND OFF / DRESSING APPLIED:    19:15   SITE UPON ARRIVAL:    Level 0  SITE AFTER BAND REMOVAL:    Level 0  REVERSE ALLEN'S TEST:     positive  CIRCULATION SENSATION AND MOVEMENT:    Within Normal Limits   yes  COMMENTS:

## 2013-05-22 NOTE — CV Procedure (Signed)
Cardiac Catheterization Operative Report  Keith Keith [id number] 4/1/20152:26 PM No primary provider on file.  Procedure Performed:  1. Selective coronary angiography (coronaries only) 2. Fractional flow reserve first obtuse marginal branch 3. PTCA/DES x 1 first obtuse marginal     Operator: Verne Carrowhristopher McAlhany, MD  Indication: 68 yo male with history of HTN, former tobacco abuse who was admitted 05/19/13 with an acute inferior STEMI. Dr. Tresa EndoKelly placed several DES in the RCA. He was also found to have a severe, 99% stenosis in the moderate caliber OM branch. He has done well over the last 2 days with overall improvement in symptoms, some dyspnea on exertion. Although this is not clearly angina, it may be suggestive of ongoing ischemia in the distribution of the OM. After review of the the films with the interventional team, he was placed on the schedule today for evaluation of the OM branch.                            Procedure Details: The risks, benefits, complications, treatment options, and expected outcomes were discussed with the patient. The patient and/or family concurred with the proposed plan, giving informed consent. The patient was brought to the cath lab after IV hydration was begun and oral premedication was given. The patient was further sedated with Versed and Fentanyl. The right wrist was prepped and draped in the usual manner. Using the modified Seldinger access technique, a 5/6 French sheath was placed in the right radial artery. 3 mg Verapamil was given. 8000 Units IV heparin was given x 1. He was given an additional 180 mg Brilinta po x 1. I engaged the left main with a XB LAD 3.5 guiding catheter. When the ACT was over 200, I passed a Volcano pressure wire down the Circumflex and into the OM branch. Baseline FFR was 0.90. With infusion of IV adenosine, the FFR fell to 0.75 suggesting the flow down the OM branch was significantly reduced. This vessel is moderate in  caliber and supplies a large segment of myocardium. The severe stenosis is felt to be high risk and because of this along with dyspnea, we elected to treat the stenosis in the OM branch.   A 2.0 x 12 mm balloon was used to pre-dilate the stenosis in the OM. A 2.5 x 15 mm Xience DES was deployed in the mid segment of OM1. The stent was post-dilated with a 2.75 x 12 mm Barber balloon x1. The stenosis was taken from 99% down to 0%. At this point, angiography of the RCA was performed to document patency of the RCA stent. There were no immediate complications. The patient was taken to the recovery area in stable condition.   Hemodynamic Findings: Central aortic pressure: 106/39  Angiographic Findings:  Left main: Patent with minimal plaque. LAD: Large caliber vessel that courses to the apex. Ostial 30% stenosis. Mid vessel with diffuse 50% narrowing. Diagonal branch is moderate in caliber with ostial 80% stenosis and diffuse 90% stenosis in the mid body of the vessel.  Ramus intermediate: Small caliber vessel with ostial 95% stenosis.  Circumflex: Moderate to large caliber vessel with moderate caliber OM1 with 99% mid stenosis. OM2 is very small in caliber with mid 99% sub-total occlusion (too small for PCI).  RCA:  Large dominant vessel with patent stents distal vessel.  Right carotid artery was a small to moderate size vessel that had proximal narrowing of 30% beyond the initial  bend. In the mid region there was 70% stenosis followed by 90% stenosis before the acute margin, and just after the acute margin the vessel is 95-99% stenosed followed by 80-90% stenosed before the PDA.   Impression: 1. Recent inferior STEMI with patent stents RCA 2. Severe stenosis OM1 with FFR of 0.75, large territory of myocardium at risk and ongoing dyspnea representing possible continued ischemia.  3. Successful PTCA/DES x 1 OM1  Recommendations: He will need dual anti-platelet therapy with ASA and Brilinta for at least  one year. Continue other meds. Home in am if stable.        Complications:  None; patient tolerated the procedure well.

## 2013-05-22 NOTE — H&P (View-Only) (Signed)
    SUBJECTIVE:  Denies any chest pain. No SOB.    PHYSICAL EXAM Filed Vitals:   05/21/13 0611 05/21/13 1359 05/21/13 1955 05/22/13 0532  BP: 105/75 117/76 118/76 128/76  Pulse: 96 87 93 74  Temp: 99 F (37.2 C) 98.6 F (37 C) 98.1 F (36.7 C) 99 F (37.2 C)  TempSrc: Oral Oral Oral Oral  Resp: 18 18 18 18  Height:      Weight:      SpO2: 96% 100% 98% 100%   General:  No distress Lungs:  Clear Heart:  RRR Abdomen:  Positive bowel sounds, no rebound no guarding Extremities:  No edema, right groin echymosis   LABS:   Results for orders placed during the hospital encounter of 05/19/13 (from the past 24 hour(s))  PROTIME-INR     Status: None   Collection Time    05/22/13  4:20 AM      Result Value Ref Range   Prothrombin Time 13.0  11.6 - 15.2 seconds   INR 1.00  0.00 - 1.49  BASIC METABOLIC PANEL     Status: Abnormal   Collection Time    05/22/13  4:20 AM      Result Value Ref Range   Sodium 136 (*) 137 - 147 mEq/L   Potassium 4.7  3.7 - 5.3 mEq/L   Chloride 102  96 - 112 mEq/L   CO2 21  19 - 32 mEq/L   Glucose, Bld 88  70 - 99 mg/dL   BUN 18  6 - 23 mg/dL   Creatinine, Ser 1.48 (*) 0.50 - 1.35 mg/dL   Calcium 9.1  8.4 - 10.5 mg/dL   GFR calc non Af Amer 48 (*) >90 mL/min   GFR calc Af Amer 55 (*) >90 mL/min    Intake/Output Summary (Last 24 hours) at 05/22/13 0938 Last data filed at 05/22/13 0259  Gross per 24 hour  Intake    600 ml  Output   1700 ml  Net  -1100 ml    ASSESSMENT AND PLAN:  NSTEMI  Plan for repeat LHC today for planned PCI on the OM vessel. INR is WNL.   ELEVATED CREAT:   Slight improvement overnight with IVFs. Scr is 1.48 today. He will need further hydration post cath.   HTN:  Stable today. Most recent was 128/76.   Keith, Keith 05/22/2013 9:38 AM  History and all data above reviewed.  Patient examined.  I agree with the findings as above.   The patient exam reveals COR:RRR  ,  Lungs: Clear  ,  Abd: Positive bowel sounds,  no rebound no guarding, Ext No edema  .  All available labs, radiology testing, previous records reviewed. Agree with documented assessment and plan. CAD:  Plan for PCI today with Dr. McAlhany  Keith Keith  11:59 AM  05/22/2013  

## 2013-05-22 NOTE — Interval H&P Note (Signed)
History and Physical Interval Note:  05/22/2013 1:25 PM  Keith Keith  has presented today for cardiac cath with the diagnosis of recent STEMI, staged PCI of severe stenosis in moderate to large caliber OM branch.  The various methods of treatment have been discussed with the patient and family. After consideration of risks, benefits and other options for treatment, the patient has consented to  Procedure(s): PERCUTANEOUS CORONARY STENT INTERVENTION (PCI-S) (N/A) as a surgical intervention .  The patient's history has been reviewed, patient examined, no change in status, stable for surgery.  I have reviewed the patient's chart and labs.  Questions were answered to the patient's satisfaction.    Cath Lab Visit (complete for each Cath Lab visit)  Clinical Evaluation Leading to the Procedure:   ACS: no  Non-ACS:    Anginal Classification: CCS III  Anti-ischemic medical therapy: Minimal Therapy (1 class of medications)  Non-Invasive Test Results: No non-invasive testing performed  Prior CABG: No previous CABG        MCALHANY,CHRISTOPHER

## 2013-05-23 ENCOUNTER — Encounter (HOSPITAL_COMMUNITY): Payer: Self-pay | Admitting: Physician Assistant

## 2013-05-23 ENCOUNTER — Other Ambulatory Visit: Payer: Self-pay | Admitting: Physician Assistant

## 2013-05-23 ENCOUNTER — Other Ambulatory Visit: Payer: Self-pay

## 2013-05-23 ENCOUNTER — Encounter: Payer: Self-pay | Admitting: Cardiovascular Disease

## 2013-05-23 ENCOUNTER — Inpatient Hospital Stay (HOSPITAL_COMMUNITY): Payer: Medicare Other

## 2013-05-23 ENCOUNTER — Telehealth: Payer: Self-pay | Admitting: *Deleted

## 2013-05-23 DIAGNOSIS — N182 Chronic kidney disease, stage 2 (mild): Secondary | ICD-10-CM

## 2013-05-23 DIAGNOSIS — I10 Essential (primary) hypertension: Secondary | ICD-10-CM

## 2013-05-23 DIAGNOSIS — I219 Acute myocardial infarction, unspecified: Secondary | ICD-10-CM

## 2013-05-23 LAB — BASIC METABOLIC PANEL
BUN: 18 mg/dL (ref 6–23)
CO2: 22 mEq/L (ref 19–32)
Calcium: 9 mg/dL (ref 8.4–10.5)
Chloride: 102 mEq/L (ref 96–112)
Creatinine, Ser: 1.37 mg/dL — ABNORMAL HIGH (ref 0.50–1.35)
GFR calc non Af Amer: 52 mL/min — ABNORMAL LOW (ref >90)
GFR, EST AFRICAN AMERICAN: 61 mL/min — AB (ref >90)
Glucose, Bld: 82 mg/dL (ref 70–99)
POTASSIUM: 4.6 meq/L (ref 3.7–5.3)
SODIUM: 137 meq/L (ref 137–147)

## 2013-05-23 LAB — CBC
HCT: 40.1 % (ref 39.0–52.0)
Hemoglobin: 14 g/dL (ref 13.0–17.0)
MCH: 31.4 pg (ref 26.0–34.0)
MCHC: 34.9 g/dL (ref 30.0–36.0)
MCV: 89.9 fL (ref 78.0–100.0)
Platelets: 104 10*3/uL — ABNORMAL LOW (ref 150–400)
RBC: 4.46 MIL/uL (ref 4.22–5.81)
RDW: 13.5 % (ref 11.5–15.5)
WBC: 7.2 10*3/uL (ref 4.0–10.5)

## 2013-05-23 LAB — PROTIME-INR
INR: 1.05 (ref 0.00–1.49)
Prothrombin Time: 13.5 seconds (ref 11.6–15.2)

## 2013-05-23 MED ORDER — ATORVASTATIN CALCIUM 40 MG PO TABS: 40.0000 mg | ORAL_TABLET | Freq: Every evening | ORAL | Status: AC

## 2013-05-23 MED ORDER — METOPROLOL TARTRATE 25 MG PO TABS: 25.0000 mg | ORAL_TABLET | Freq: Two times a day (BID) | ORAL | Status: DC

## 2013-05-23 MED ORDER — NITROGLYCERIN 0.4 MG SL SUBL: 0.4000 mg | SUBLINGUAL_TABLET | SUBLINGUAL | Status: DC | PRN

## 2013-05-23 MED ORDER — METOPROLOL TARTRATE 12.5 MG HALF TABLET
12.5000 mg | ORAL_TABLET | Freq: Once | ORAL | Status: AC
  Administered 2013-05-23: 11:00:00 12.5 mg via ORAL
  Filled 2013-05-23: qty 1

## 2013-05-23 MED ORDER — TICAGRELOR 90 MG PO TABS: 90.0000 mg | ORAL_TABLET | Freq: Two times a day (BID) | ORAL | Status: DC

## 2013-05-23 NOTE — Discharge Summary (Signed)
Discharge Summary   Patient ID: Keith Keith MRN: [id number], DOB/AGE: 1945-09-14 68 y.o. Admit date: 05/19/2013 D/C date:     05/23/2013  Primary Care Provider: No primary provider on file. Primary Cardiologist: New this admission - prefers [facility] and will follow up with Dr. Diona Browner  Primary Discharge Diagnoses:  1. Inferior STEMI 05/19/13 s/p DES x2 to RCA on 05/19/13, s/p staged DES to OM 05/22/13 2. 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 HTN  4. Thrombocytopenia, unclear chronicity 5. Possible lab error on initial low sodium level  Hospital Course:Keith Keith is a 68 y/o M from [facility] with a history of HTN and mild renal insufficiency (states his PCP had told him this) who presented to [facility] on 05/19/2013 with midsternal chest pressure with associated nausea, retching and diaphoresis. He was taken to [facility] where he was found to have inferior STEMI. IV heparin was given and he was transported by EMS to [facility] for emergent cardiac cath. He was found to have high grade diffuse mid distal RCA disease with subtotal RCA stenosis and received 2 DES (Promus Premier 2.25x32 and 2.25x20 mm). He had residual disease involving the LAD/diag and OM1. He also had low sodium level of 118 on admission but it is unclear if this was accurate as repeat 3 hours later was 136 and he had no mental status changes to suggest clinical hyponatremia. He went back to the cath lab 05/22/13 for staged PCI and underwent successful DES to the OM1 (FFR of 0.75). His LAD disease will be treated medically. He tolerated the procedure well. Cr remained stable post cath. Presenting Cr was 1.29 in line with where the pt had described as outpatient (there are Istats with Cr 0.7 but this was not a full set and sometimes does not correlate). Peak Cr this admission was 1.58, down to 1.37 today. He was started on BB in lieu of home antihypertensives. It was also noted that he had low platelet count this admission  that remained stable around 100. He denied any prior history of this or any bleeding. He denies any alcohol use. He was asked to follow up with his PCP for further evaluation. Today he feels well. Dr. Tresa Endo has seen and examined the patient today and feels she is stable for discharge. He is not to return to work until cleared in followup. He was given 30 day free Brilinta RX along with regular rx - printed since he gets meds at the [place].  Consider f/u lipids/lfts 6 weeks given statin initiation. Given Cr, dye exposure, and controlled BP we will not resume home lisinopril or HCTZ at this time. Will recheck BMET 4/7 (day before his appt) - if Cr is stable, consider re-initiation of ACEI at that visit.  Discharge Vitals: Blood pressure 115/71-> 150s, metoprolol increased day of discharge, pulse 91, temperature 98 F (36.7 C), temperature source Oral, resp. rate 18, height 5\' 5"  (1.651 m), weight 170 lb 6.7 oz (77.3 kg), SpO2 98.00%.  Labs: Lab Results  Component Value Date   WBC 7.2 05/23/2013   HGB 14.0 05/23/2013   HCT 40.1 05/23/2013   MCV 89.9 05/23/2013   PLT 104* 05/23/2013    Recent Labs Lab 05/20/13 0047  05/23/13 0455  NA 138  < > 137  K 4.4  < > 4.6  CL 101  < > 102  CO2 25  < > 22  BUN 16  < > 18  CREATININE 1.47*  < > 1.37*  CALCIUM 8.1*  < > 9.0  PROT 5.6*  --   --   BILITOT 0.6  --   --   ALKPHOS 43  --   --   ALT 18  --   --   AST 22  --   --   GLUCOSE 101*  < > 82  < > = values in this interval not displayed.  Lab Results  Component Value Date   CHOL 128 05/20/2013   HDL 42 05/20/2013   LDLCALC 68 05/20/2013   TRIG 92 05/20/2013   Troponin neg, then 2.46-2.80  Diagnostic Studies/Procedures   Dg Chest 2 View 05/23/2013   CLINICAL DATA:  Myocardial infarction 05/19/2013.  EXAM: CHEST  2 VIEW  COMPARISON:  Single view of the chest 05/19/2013.  FINDINGS: There is a tiny left pleural effusion. No right pleural effusion is identified. Lungs are clear. Heart size is normal. No  pneumothorax.  IMPRESSION: Tiny left pleural effusion.  The examination is otherwise negative.   Electronically Signed   By: Drusilla Kanner M.D.   On: 05/23/2013 08:00   1st Cath 05/19/13 PROCEDURE: Emergent cardiac catheterization, coronary angiography, as well as cardiac intervention with PTCA at 4 sites of his RCA with tandem stenting and left ventriculography.  Upon arrival to the cardiac catheterization laboratory, the patient was still having residual chest pain but this had improved following administration of heparin prior to transfer. His right femoral artery was punctured anteriorly and a 6 French sheath was inserted without difficulty. First at 2 mg and fentanyl 50 mcg were administered for conscious sedation. A J40 diagnostic catheter was used for selective angiography into the left coronary system. Initially, a 6 [place] 4 guide was inserted but due to significant catheter-induced dampening of pressure, this was exchanged for a 6 [place] JR 4 with side hole guiding catheter. The demonstration of multiple high grade stenoses in the RCA the decision was made to attempt emergent percutaneous coronary intervention. Bivalirudin bolus plus infusion was administered. The patient received 180 mg of oral Brilinta. A  Prowater wire was advanced down the RCA into the posterolateral vessel. A 2.0x15 mm sprinter legend balloon was inserted. As to the distal RCA and multiple inflations were made at 3 distinct sites in the in the distal region, just after the acute margin, and in the mid ICA proximal to the acute margin. Tandem stenting was performed with insertion of a 2.25x32 mm Promus Premier DES stent inserted distally and a second stent 2.25x20 mm Promus Premier DES stent was inserted and tandem fashion extending to the mid RCA. Post-stent dilatation was done using an Emp[employer] [place] 2.5 x 20 mm balloon with tapering from 2.4 mm in the mid RCA tooth 2.32 mm and the most distal aspect of the second stent. Scout  angiography confirmed an excellent angiographic result. The balloon wire and RCA guide were removed and a 6 French pigtail catheter was inserted. Left ventriculography was performed using 25 cc of Omnipaque contrast at a flow rate of 12 cc per second. Throughout the procedure, the patient was hypotensive and he did receive fluid bolus. He also was started on KCL 10 mEq infusion when he was found to be hypokalemic with potassium of 3.0 The arterial sheath was sutured in place. Plans are for continuation of Angiomax for 4 hours post procedure. Hhe left the catheterization laboratory chest pain-free with stable hemodynamics.  HEMODYNAMICS:  Central Aorta: 85/60  Left Ventricle: 85/10  ANGIOGRAPHY:  The left main coronary artery was  angiographically normal vessel which trifurcated into a moderate size LAD, a diminutive ramus intermediate vessel, and a moderate-sized circumflex vessel.  The LAD gave rise to a proximal diagonal vessel that had 80% ostial stenosis. The superior branch of this diagonal vessel had diffuse MID narrowing of 89% and a inferior branch of this diagonal vessel had 60% narrowing. There was diffuse 60-70% narrowing in the LAD after the diagonal takeoff. The LAD extended to and wrapped around the LV apex.  The ramus intermediate vessel was a small vessel that had diffuse 95% ostial narrowing.  The circumflex vessel gave rise to 2 major marginal vessels. The first marginal vessel had an inferior branch which was very small caliber but had 90% stenosis.  Right carotid artery was a small to moderate size vessel that had proximal narrowing of 30% beyond the initial bend. In the mid region there was 70% stenosis followed by 90% stenosis before the acute margin, and just after the acute margin the vessel is 95-99% stenosed followed by 80-90% stenosed before the PDA.  Following percutaneous coronary intervention with PTCA diffusely from the mid RCA distally and with ultimate tandem stenting with  2.25x32 mm and 2.25x20 mm Promus Premier DES stents, post dilated with stent taper from 2.4 mm at the mid segment to 2.32 mm distally, the entire region was reduced to 0%. There was brisk TIMI-3 flow. There was no evidence for dissection. The door to balloon time was 30 minutes.  Left ventriculography revealed normal systolic function with an ejection fraction of at least 60-65% without focal segmental wall motion abnormalities.  IMPRESSION:  Acute inferior ST segment elevation myocardial infarction secondary to high grade diffuse mid distal RCA disease with subtotal RCA stenosis.  Concomitant coronary obstructive disease involving the LAD with 80% focal ostial stenosis in the first diagonal branch with diffuse 80-90% stenosis in the superior aspect of this diagonal branch and 60% stenosis in the inferior portion of this branch followed by 60-70% LAD stenoses after the first out of vessel; 95% stenosis of the very small diminutive ramus intermediate vessel; and 90% stenosis in an inferior branch of the OM1 vessel the left circumflex coronary artery.  Successful acute intervention to the RCA with insertion of Promus Premier 2.25x32 and 2.25x20 mm stents postdilated to 2.4 mm down to 2.32 mm from the mid segment distally with excellent door to balloon time of 30 minutes and stenoses being reduced to 0%.  Normal acute LV function with an ejection fraction of 60-65% without focal segmental wall motion abnormalities.  RECOMMENDATION:  Mr. Edmiston developed new onset chest pain today associated with 2-3 mm of inferior ST segment elevation. He was treated with heparin and fluids for hypotension at [facility] and upon arrival to [facility] his chest pain was still present but improved. Initial angiography confirmed high-grade multiple stenoses in the RCA which is the culprit vessel with subtotal stenosis but with TIMI-3 flow. Following successful emergent revascularization, only function appears fairly normal. He  does have concomitant CAD. He will be started on medical therapy.  Lennette Bihari, MD, [facility]  05/19/2013  2:09 PM  2D cath 05/22/13 Cardiac Catheterization Operative Report  Keith Keith  [id number]  4/1/20152:26 PM  No primary provider on file.  Procedure Performed:  1. Selective coronary angiography (coronaries only)  2. Fractional flow reserve first obtuse marginal branch  3. PTCA/DES x 1 first obtuse marginal  Operator: Verne Carrow, MD  Indication: 68 yo male with history of HTN, former tobacco abuse who was  admitted 05/19/13 with an acute inferior STEMI. Dr. Tresa EndoKelly placed several DES in the RCA. He was also found to have a severe, 99% stenosis in the moderate caliber OM branch. He has done well over the last 2 days with overall improvement in symptoms, some dyspnea on exertion. Although this is not clearly angina, it may be suggestive of ongoing ischemia in the distribution of the OM. After review of the the films with the interventional team, he was placed on the schedule today for evaluation of the OM branch.  Procedure Details:  The risks, benefits, complications, treatment options, and expected outcomes were discussed with the patient. The patient and/or family concurred with the proposed plan, giving informed consent. The patient was brought to the cath lab after IV hydration was begun and oral premedication was given. The patient was further sedated with Versed and Fentanyl. The right wrist was prepped and draped in the usual manner. Using the modified Seldinger access technique, a 5/6 French sheath was placed in the right radial artery. 3 mg Verapamil was given. 8000 Units IV heparin was given x 1. He was given an additional 180 mg Brilinta po x 1. I engaged the left main with a XB LAD 3.5 guiding catheter. When the ACT was over 200, I passed a [place] pressure wire down the Circumflex and into the OM branch. Baseline FFR was 0.90. With infusion of IV adenosine, the FFR fell to 0.75  suggesting the flow down the OM branch was significantly reduced. This vessel is moderate in caliber and supplies a large segment of myocardium. The severe stenosis is felt to be high risk and because of this along with dyspnea, we elected to treat the stenosis in the OM branch.  A 2.0 x 12 mm balloon was used to pre-dilate the stenosis in the OM. A 2.5 x 15 mm Xience DES was deployed in the mid segment of OM1. The stent was post-dilated with a 2.75 x 12 mm [place] balloon x1. The stenosis was taken from 99% down to 0%. At this point, angiography of the RCA was performed to document patency of the RCA stent. There were no immediate complications. The patient was taken to the recovery area in stable condition.  Hemodynamic Findings:  Central aortic pressure: 106/39  Angiographic Findings:  Left main: Patent with minimal plaque.  LAD: Large caliber vessel that courses to the apex. Ostial 30% stenosis. Mid vessel with diffuse 50% narrowing. Diagonal branch is moderate in caliber with ostial 80% stenosis and diffuse 90% stenosis in the mid body of the vessel.  Ramus intermediate: Small caliber vessel with ostial 95% stenosis.  Circumflex: Moderate to large caliber vessel with moderate caliber OM1 with 99% mid stenosis. OM2 is very small in caliber with mid 99% sub-total occlusion (too small for PCI).  RCA: Large dominant vessel with patent stents distal vessel.  Right carotid artery was a small to moderate size vessel that had proximal narrowing of 30% beyond the initial bend. In the mid region there was 70% stenosis followed by 90% stenosis before the acute margin, and just after the acute margin the vessel is 95-99% stenosed followed by 80-90% stenosed before the PDA.  Impression:  1. Recent inferior STEMI with patent stents RCA  2. Severe stenosis OM1 with FFR of 0.75, large territory of myocardium at risk and ongoing dyspnea representing possible continued ischemia.  3. Successful PTCA/DES x 1 OM1    Recommendations: He will need dual anti-platelet therapy with ASA and Brilinta for at  least one year. Continue other meds. Home in am if stable.  Complications: None; patient tolerated the procedure well.    Discharge Medications   Current Discharge Medication List    START taking these medications   Details  atorvastatin (LIPITOR) 40 MG tablet Take 1 tablet (40 mg total) by mouth every evening. Qty: 30 tablet, Refills: 6    metoprolol tartrate (LOPRESSOR) 25 MG tablet Take 1 tablet (25 mg total) by mouth 2 (two) times daily. Qty: 60 tablet, Refills: 6    nitroGLYCERIN (NITROSTAT) 0.4 MG SL tablet Place 1 tablet (0.4 mg total) under the tongue every 5 (five) minutes as needed for chest pain (up to 3 doses). Qty: 25 tablet, Refills: 3    Ticagrelor (BRILINTA) 90 MG TABS tablet Take 1 tablet (90 mg total) by mouth 2 (two) times daily. Qty: 60 tablet, Refills: 10      CONTINUE these medications which have NOT CHANGED   Details  aspirin EC 81 MG tablet Take 81 mg by mouth daily.    Cholecalciferol (VITAMIN D-3) 1000 UNITS CAPS Take 1,000 Units by mouth daily.     omeprazole (PRILOSEC) 20 MG capsule Take 20 mg by mouth 2 (two) times daily before a meal.    vitamin B-12 (CYANOCOBALAMIN) 1000 MCG tablet Take 1,000 mcg by mouth daily.      STOP taking these medications     diltiazem (TIAZAC) 180 MG 24 hr capsule      hydrochlorothiazide (HYDRODIURIL) 25 MG tablet      lisinopril (PRINIVIL,ZESTRIL) 40 MG tablet         Disposition   The patient will be discharged in stable condition to home. Discharge Orders   Future Appointments Provider Department Dept Phone   05/29/2013 10:00 AM Jonelle Sidle, MD [facility] [phone]   Future Orders Complete By Expires   Amb Referral to Cardiac Rehabilitation  As directed    Diet - low sodium heart healthy  As directed    Increase activity slowly  As directed    Scheduling Instructions:     No driving for 2  weeks. No lifting over 10 lbs for 4 weeks. No sexual activity for 4 weeks. You may not return to work until cleared by your cardiologist. Keep procedure site clean & dry. If you notice increased pain, swelling, bleeding or pus, call/return!  You may shower, but no soaking baths/hot tubs/pools for 1 week.     Follow-up Information   Follow up with [facility]. (Please have labs drawn (BMET) on 05/28/13 at [facility] across the street from [facility])    Contact information:   [facility] [address], [address]  [place], [place] [address]  [phone] (Phone)  (Fax) Monday - Friday 7:00am-6:00pm      Follow up with Nona Dell, MD. [facility] (in [facility], 1st floor) - 05/29/13 at 10am)    Specialty:  Cardiology   Contact information:   [address] [place] [address] [phone]       Follow up with [facility], MD. (To discuss evaluation of your decreased platelet count and to follow your kidney function)    Specialty:  Internal Medicine   Contact information:   [address]  [place] [address] [phone]         Duration of Discharge Encounter: Greater than 30 minutes including physician and PA time.  Signed, Ronie Spies PA-C 05/23/2013, 10:33 AM

## 2013-05-23 NOTE — Telephone Encounter (Signed)
7 day tcm

## 2013-05-23 NOTE — Progress Notes (Signed)
  Patient: Keith Keith / Admit Date: 05/19/2013 / Date of Encounter: 05/23/2013, 6:41 AM  Subjective  Feeling great. No CP or SOB. He is frustrated at being told that his platelet count is low and kidney function remains abnormal. He says his PCP told him at last physical that his kidney function was abnormal and he is upset because the [place] told him he was healthy.  Objective   Telemetry: NSR  Physical Exam: Blood pressure 115/71, pulse 77, temperature 98.4 F (36.9 C), temperature source Oral, resp. rate 18, height 5\' 5"  (1.651 m), weight 170 lb 6.7 oz (77.3 kg), SpO2 98.00%. General: Well developed, well nourished AAM in no acute distress. Head: Normocephalic, atraumatic, sclera non-icteric, no xanthomas, nares are without discharge. Neck: Negative for carotid bruits. JVP not elevated. Lungs: Clear bilaterally to auscultation without wheezes, rales, or rhonchi. Breathing is unlabored. Heart: RRR S1 S2 without murmurs, rubs, or gallops.  Abdomen: Soft, non-tender, non-distended with normoactive bowel sounds. No rebound/guarding. Extremities: No clubbing or cyanosis. No edema. Distal pedal pulses are 2+ and equal bilaterally. R radial site without complication. R groin site without bruit but soft ecchymosis just distal in groin crook to cath site, no hematoma Neuro: Alert and oriented X 3. Moves all extremities spontaneously. Psych:  Responds to questions appropriately with a normal affect.   Intake/Output Summary (Last 24 hours) at 05/23/13 0641 Last data filed at 05/23/13 0400  Gross per 24 hour  Intake    815 ml  Output   1000 ml  Net   -185 ml    Inpatient Medications:  . aspirin  81 mg Oral Daily  . atorvastatin  40 mg Oral q1800  . metoprolol tartrate  12.5 mg Oral BID  . Ticagrelor  90 mg Oral BID    Infusions:    Labs:  Recent Labs  05/22/13 0420 05/23/13 0455  NA 136* 137  K 4.7 4.6  CL 102 102  CO2 21 22  GLUCOSE 88 82  BUN 18 18  CREATININE 1.48* 1.37*    CALCIUM 9.1 9.0    Recent Labs  05/23/13 0455  WBC 7.2  HGB 14.0  HCT 40.1  MCV 89.9  PLT 104*    Radiology/Studies:  No results found.   Assessment and Plan  1. Inferior STEMI 05/19/13 s/p DES x2 to RCA, s/p staged DES to OM yesterday 2. Renal insufficiency - adm Cr 1.29 (0.8 by istat but these can be wrong), suspect CKD stage II-III, peak 1.58, stable at 1.37 post cath 3. HTN 4. Thrombocytopenia, unclear chronicity  DAPT x 1 yr. Continue beta blocker, statin. Would not restart on HCTZ, lisinopril or diltiazem at present due to Cr and BP generally controlled on metoprolol - can consider increasing to 25mg  BID. Check baseline CXR prior to dc. F/u PCP for platelet eval. Dr. Tresa Endo to recommend if Cr recheck is needed as outpatient. He wants to f/u in [place] office.   Signed, Ronie Spies PA-C   Patient seen and examined. Agree with assessment and plan. Doing well; no recurrent chest pain. S/P acute PCI to RCA and subsequent staged PCI to LCS. Not well beta-blocked. Increase metoprolol to 25 mg bid. Start ACE-I as outpatient to allow additional time from second dye load with mild renal insufficiency. DC today.   Lennette Bihari, MD, [facility] 05/23/2013 10:14 AM

## 2013-05-23 NOTE — Progress Notes (Signed)
CARDIAC REHAB PHASE I   PRE:  Rate/Rhythm: 87 SR  BP:  Supine:   Sitting: 143/86  Standing:    SaO2:   MODE:  Ambulation: 1000 ft   POST:  Rate/Rhythm: 93 SR  BP:  Supine:   Sitting: 145/95  Standing:    SaO2:  [id number] Pt walked 1000 ft with steady gait. No CP. Tolerated well. BP a little elevated after walk. Completed ex ed with pt who voiced understanding. Referring to [place] Phase 2.   Luetta Nutting, RN BSN  05/23/2013 8:43 AM

## 2013-05-24 NOTE — Telephone Encounter (Signed)
Patient contacted regarding discharge from [facility] on 05-24-13.  Patient understands to follow up with provider Diona Browner on 05-29-13 at 10:00am at [place]. Patient understands discharge instructions? yes Patient understands medications and regiment? yes Patient understands to bring all medications to this visit? yes

## 2013-05-28 ENCOUNTER — Other Ambulatory Visit: Payer: Self-pay | Admitting: Physician Assistant

## 2013-05-28 LAB — BASIC METABOLIC PANEL
BUN: 14 mg/dL (ref 6–23)
CO2: 29 meq/L (ref 19–32)
Calcium: 9.8 mg/dL (ref 8.4–10.5)
Chloride: 101 mEq/L (ref 96–112)
Creat: 1.46 mg/dL — ABNORMAL HIGH (ref 0.50–1.35)
Glucose, Bld: 92 mg/dL (ref 70–99)
Potassium: 4.9 mEq/L (ref 3.5–5.3)
SODIUM: 140 meq/L (ref 135–145)

## 2013-05-29 ENCOUNTER — Encounter: Payer: Self-pay | Admitting: Cardiology

## 2013-05-29 ENCOUNTER — Ambulatory Visit (INDEPENDENT_AMBULATORY_CARE_PROVIDER_SITE_OTHER): Payer: Medicare Other | Admitting: Cardiology

## 2013-05-29 VITALS — BP 149/85 | HR 74 | Ht 65.0 in | Wt 162.0 lb

## 2013-05-29 DIAGNOSIS — I2510 Atherosclerotic heart disease of native coronary artery without angina pectoris: Secondary | ICD-10-CM

## 2013-05-29 DIAGNOSIS — N182 Chronic kidney disease, stage 2 (mild): Secondary | ICD-10-CM

## 2013-05-29 DIAGNOSIS — I10 Essential (primary) hypertension: Secondary | ICD-10-CM

## 2013-05-29 NOTE — Assessment & Plan Note (Signed)
Continue current regimen

## 2013-05-29 NOTE — Patient Instructions (Signed)
Your physician recommends that you schedule a follow-up appointment in: 3 months with Dr Diona Browner   Your physician recommends that you return for lab work before next visit. LIPID/LIVER

## 2013-05-29 NOTE — Assessment & Plan Note (Signed)
Creatinine 1.4 by most recent assessment.

## 2013-05-29 NOTE — Progress Notes (Signed)
Clinical Summary [name] is a 68 y.o.male presenting for hospital followup. This is my first meeting with him. Hospital records reviewed. [facility] with an inferior wall myocardial infarction. Cardiac catheterization revealed severe RCA disease, treated with DES x 2. He did have a staged DES intervention to an obtuse marginal branch during the hospital stay. He was found to have normal LVEF by left ventriculography estimated at 60-65%, and medical therapy was optimized. He was recently discharged on April 2.  He is here with his wife today. Reports recuperating well. Had several questions which we addressed in full. We did discuss merits of cardiac rehabilitation and made a referral. We also reviewed his medications and discuss the importance of DAPT.  Recent lab work showed potassium 4.9, BUN 14, creatinine 1.4, hemoglobin 14.0, platelets 104. Lipid panel showed cholesterol 128, triglycerides 92, HDL 42, LDL 68. Lipitor is a new medication for him.   No Known Allergies  Current Outpatient Prescriptions  Medication Sig Dispense Refill  . aspirin EC 81 MG tablet Take 81 mg by mouth daily.      Marland Kitchen. atorvastatin (LIPITOR) 40 MG tablet Take 1 tablet (40 mg total) by mouth every evening.  30 tablet  6  . Cholecalciferol (VITAMIN D-3) 1000 UNITS CAPS Take 1,000 Units by mouth daily.       . metoprolol tartrate (LOPRESSOR) 25 MG tablet Take 1 tablet (25 mg total) by mouth 2 (two) times daily.  60 tablet  6  . nitroGLYCERIN (NITROSTAT) 0.4 MG SL tablet Place 1 tablet (0.4 mg total) under the tongue every 5 (five) minutes as needed for chest pain (up to 3 doses).  25 tablet  3  . omeprazole (PRILOSEC) 20 MG capsule Take 20 mg by mouth 2 (two) times daily before a meal.      . Ticagrelor (BRILINTA) 90 MG TABS tablet Take 1 tablet (90 mg total) by mouth 2 (two) times daily.  60 tablet  10  . vitamin B-12 (CYANOCOBALAMIN) 1000 MCG tablet Take 1,000 mcg by  mouth daily.       No current facility-administered medications for this visit.    Past Medical History  Diagnosis Date  . Essential hypertension, benign   . Thrombocytopenia   . CKD (chronic kidney disease), stage II   . Coronary atherosclerosis of native coronary artery     DES x 2 to RCA on 05/19/13, staged DES to OM 05/22/13, residual LAD/diag disease for medical management  . Inferior myocardial infarction     March 2015    Social History [name] reports that he quit smoking about 10 years ago. His smoking use included Cigarettes. He smoked 0.00 packs per day. He has never used smokeless tobacco. [name] reports that he drinks about .6 ounces of alcohol per week.  Review of Systems And resolving ecchymoses on the right thigh, this is been tracking downward. No pain in his leg or difficulty ambulate. No bleeding problems. Otherwise negative.  Physical Examination Filed Vitals:   05/29/13 0957  BP: 149/85  Pulse: 74   Filed Weights   05/29/13 0957  Weight: 162 lb (73.483 kg)   Patient appears comfortable at rest. HEENT: Conjunctiva and lids normal, oropharynx clear. Neck: Supple, no elevated JVP or carotid bruits, no thyromegaly. Lungs: Clear to auscultation, nonlabored breathing at rest. Cardiac: Regular rate and rhythm, no S3 or significant systolic murmur, no pericardial rub. Abdomen: Soft, nontender, bowel sounds present. Extremities: No  pitting edema, distal pulses 2+. Ecchymosis in right groin and thigh area appear to be resolving, no bruit or hematoma evident. Skin: Warm and dry. Musculoskeletal: No kyphosis. Neuropsychiatric: Alert and oriented x3, affect grossly appropriate.   Problem List and Plan   Coronary atherosclerosis of native coronary artery Stable at this point, recent history reviewed. He is status post DES x 2 to the RCA in the setting of acute inferior wall infarct with staged DES to the obtuse marginal during hospitalization. LVEF is in  normal range. Continue current medications, referral made to cardiac rehabilitation. Discussed diet and exercise. Followup arranged.  Essential hypertension, benign Continue current regimen.  CKD (chronic kidney disease), stage II Creatinine 1.4 by most recent assessment.    Jonelle Sidle, M.D., F.A.C.C.

## 2013-05-29 NOTE — Assessment & Plan Note (Signed)
Stable at this point, recent history reviewed. He is status post DES x 2 to the RCA in the setting of acute inferior wall infarct with staged DES to the obtuse marginal during hospitalization. LVEF is in normal range. Continue current medications, referral made to cardiac rehabilitation. Discussed diet and exercise. Followup arranged.

## 2013-06-06 ENCOUNTER — Encounter (HOSPITAL_COMMUNITY): Payer: Self-pay

## 2013-06-06 ENCOUNTER — Encounter (HOSPITAL_COMMUNITY)
Admission: RE | Admit: 2013-06-06 | Discharge: 2013-06-06 | Disposition: A | Discharge status: Home / Self Care | Payer: Medicare Other | Source: Ambulatory Visit | Attending: Cardiology | Admitting: Cardiology

## 2013-06-06 VITALS — BP 120/80 | HR 74 | Ht 65.0 in | Wt 160.7 lb

## 2013-06-06 DIAGNOSIS — I213 ST elevation (STEMI) myocardial infarction of unspecified site: Secondary | ICD-10-CM

## 2013-06-06 DIAGNOSIS — I214 Non-ST elevation (NSTEMI) myocardial infarction: Secondary | ICD-10-CM | POA: Insufficient documentation

## 2013-06-06 DIAGNOSIS — Z9861 Coronary angioplasty status: Secondary | ICD-10-CM

## 2013-06-06 NOTE — Patient Instructions (Signed)
Pt has finished orientation and is scheduled to start CR on 06/12/13 at 9:30 am. Pt has been instructed to arrive to class 15 minutes early for scheduled class. Pt has been instructed to wear comfortable clothing and shoes with rubber soles. Pt has been told to take their medications 1 hour prior to coming to class.  If the patient is not going to attend class, he/she has been instructed to call.

## 2013-06-06 NOTE — Progress Notes (Signed)
Patient was referred to CR by Nicki Guadalajara due to STEMI and Stent Placement. During orientation advised patient on arrival and appointment times what to wear, what to do before, during and after exercise. Reviewed attendance and class policy. Talked about inclement weather and class consultation policy. Pt is scheduled to start Cardiac Rehab on 06/12/13 at 9:30. Pt was advised to come to class 5 minutes before class starts. He was also given instructions on meeting with the dietician and attending the Family Structure classes. Pt is eager to get started. Patient was able to do 6 minute walk test.

## 2013-06-12 ENCOUNTER — Encounter (HOSPITAL_COMMUNITY)
Admission: RE | Admit: 2013-06-12 | Discharge: 2013-06-12 | Disposition: A | Discharge status: Home / Self Care | Payer: Medicare Other | Source: Ambulatory Visit | Attending: Cardiology | Admitting: Cardiology

## 2013-06-14 ENCOUNTER — Encounter (HOSPITAL_COMMUNITY)
Admission: RE | Admit: 2013-06-14 | Discharge: 2013-06-14 | Disposition: A | Discharge status: Home / Self Care | Payer: Medicare Other | Source: Ambulatory Visit | Attending: Cardiology | Admitting: Cardiology

## 2013-06-17 ENCOUNTER — Encounter (HOSPITAL_COMMUNITY)
Admission: RE | Admit: 2013-06-17 | Discharge: 2013-06-17 | Disposition: A | Discharge status: Home / Self Care | Payer: Medicare Other | Source: Ambulatory Visit | Attending: Cardiology | Admitting: Cardiology

## 2013-06-18 ENCOUNTER — Other Ambulatory Visit: Payer: Self-pay

## 2013-06-18 MED ORDER — TICAGRELOR 90 MG PO TABS: 90.0000 mg | ORAL_TABLET | Freq: Two times a day (BID) | ORAL | Status: DC

## 2013-06-19 ENCOUNTER — Encounter (HOSPITAL_COMMUNITY)
Admission: RE | Admit: 2013-06-19 | Discharge: 2013-06-19 | Disposition: A | Discharge status: Home / Self Care | Payer: Medicare Other | Source: Ambulatory Visit | Attending: Cardiology | Admitting: Cardiology

## 2013-06-21 ENCOUNTER — Encounter (HOSPITAL_COMMUNITY)
Admission: RE | Admit: 2013-06-21 | Discharge: 2013-06-21 | Disposition: A | Discharge status: Home / Self Care | Payer: Medicare Other | Source: Ambulatory Visit | Attending: Cardiology | Admitting: Cardiology

## 2013-06-21 DIAGNOSIS — Z9861 Coronary angioplasty status: Secondary | ICD-10-CM | POA: Insufficient documentation

## 2013-06-21 DIAGNOSIS — I214 Non-ST elevation (NSTEMI) myocardial infarction: Secondary | ICD-10-CM | POA: Insufficient documentation

## 2013-06-24 ENCOUNTER — Encounter (HOSPITAL_COMMUNITY)
Admission: RE | Admit: 2013-06-24 | Discharge: 2013-06-24 | Disposition: A | Discharge status: Home / Self Care | Payer: Medicare Other | Source: Ambulatory Visit | Attending: Cardiology | Admitting: Cardiology

## 2013-06-26 ENCOUNTER — Other Ambulatory Visit: Payer: Self-pay | Admitting: *Deleted

## 2013-06-26 ENCOUNTER — Encounter (HOSPITAL_COMMUNITY)
Admission: RE | Admit: 2013-06-26 | Discharge: 2013-06-26 | Disposition: A | Discharge status: Home / Self Care | Payer: Medicare Other | Source: Ambulatory Visit | Attending: Cardiology | Admitting: Cardiology

## 2013-06-26 ENCOUNTER — Telehealth: Payer: Self-pay | Admitting: Cardiology

## 2013-06-26 MED ORDER — TICAGRELOR 90 MG PO TABS: 90.0000 mg | ORAL_TABLET | Freq: Two times a day (BID) | ORAL | Status: DC

## 2013-06-26 NOTE — Telephone Encounter (Signed)
Please see paper in refill bin / tgs  °

## 2013-06-28 ENCOUNTER — Encounter (HOSPITAL_COMMUNITY)
Admission: RE | Admit: 2013-06-28 | Discharge: 2013-06-28 | Disposition: A | Discharge status: Home / Self Care | Payer: Medicare Other | Source: Ambulatory Visit | Attending: Cardiology | Admitting: Cardiology

## 2013-07-01 ENCOUNTER — Encounter (HOSPITAL_COMMUNITY)
Admission: RE | Admit: 2013-07-01 | Discharge: 2013-07-01 | Disposition: A | Discharge status: Home / Self Care | Payer: Medicare Other | Source: Ambulatory Visit | Attending: Cardiology | Admitting: Cardiology

## 2013-07-02 ENCOUNTER — Ambulatory Visit (INDEPENDENT_AMBULATORY_CARE_PROVIDER_SITE_OTHER): Payer: Medicare Other | Admitting: Adult Health

## 2013-07-02 ENCOUNTER — Encounter: Payer: Self-pay | Admitting: Adult Health

## 2013-07-02 ENCOUNTER — Encounter: Payer: Medicare Other | Admitting: Adult Health

## 2013-07-02 ENCOUNTER — Ambulatory Visit: Payer: Medicare Other | Admitting: Adult Health

## 2013-07-02 VITALS — BP 142/86 | HR 60 | Ht 66.0 in

## 2013-07-02 DIAGNOSIS — N182 Chronic kidney disease, stage 2 (mild): Secondary | ICD-10-CM

## 2013-07-02 DIAGNOSIS — I10 Essential (primary) hypertension: Secondary | ICD-10-CM

## 2013-07-02 DIAGNOSIS — I2510 Atherosclerotic heart disease of native coronary artery without angina pectoris: Secondary | ICD-10-CM

## 2013-07-02 NOTE — Progress Notes (Deleted)
Name: Neal Foerst.    DOB: 1945-04-03  Age: 68 y.o.  MR#: [id number]       PCP:  Kirstie Peri, MD      Insurance: Payor: MEDICARE / Plan: MEDICARE PART A AND B / Product Type: *No Product type* /   CC:   No chief complaint on file.   VS Filed Vitals:   07/02/13 1406  BP: 142/86  Pulse: 60  Height: 5\' 6"  (1.676 m)    Weights Current Weight  06/06/13 160 lb 11.2 oz (72.893 kg)  05/29/13 162 lb (73.483 kg)  05/23/13 170 lb 6.7 oz (77.3 kg)    Blood Pressure  BP Readings from Last 3 Encounters:  07/02/13 142/86  06/06/13 120/80  05/29/13 149/85     Admit date:  (Not on file) Last encounter with RMR:  Visit date not found   Allergy Review of patient's allergies indicates no known allergies.  Current Outpatient Prescriptions  Medication Sig Dispense Refill  . aspirin EC 81 MG tablet Take 81 mg by mouth daily.      Marland Kitchen atorvastatin (LIPITOR) 40 MG tablet Take 1 tablet (40 mg total) by mouth every evening.  30 tablet  6  . Cholecalciferol (VITAMIN D-3) 1000 UNITS CAPS Take 1,000 Units by mouth daily.       . metoprolol tartrate (LOPRESSOR) 25 MG tablet Take 1 tablet (25 mg total) by mouth 2 (two) times daily.  60 tablet  6  . nitroGLYCERIN (NITROSTAT) 0.4 MG SL tablet Place 1 tablet (0.4 mg total) under the tongue every 5 (five) minutes as needed for chest pain (up to 3 doses).  25 tablet  3  . omeprazole (PRILOSEC) 20 MG capsule Take 20 mg by mouth 2 (two) times daily before a meal.      . ticagrelor (BRILINTA) 90 MG TABS tablet Take 1 tablet (90 mg total) by mouth 2 (two) times daily.  180 tablet  3  . vitamin B-12 (CYANOCOBALAMIN) 1000 MCG tablet Take 1,000 mcg by mouth daily.       No current facility-administered medications for this visit.    Discontinued Meds:   There are no discontinued medications.  Patient Active Problem List   Diagnosis Date Noted  . History of inferior wall myocardial infarction 05/23/2013  . CKD (chronic kidney disease), stage II 05/23/2013   . Thrombocytopenia   . Essential hypertension, benign 05/19/2013  . Coronary atherosclerosis of native coronary artery 05/19/2013    LABS    Component Value Date/Time   NA 140 05/28/2013 0928   NA 137 05/23/2013 0455   NA 136* 05/22/2013 0420   K 4.9 05/28/2013 0928   K 4.6 05/23/2013 0455   K 4.7 05/22/2013 0420   CL 101 05/28/2013 0928   CL 102 05/23/2013 0455   CL 102 05/22/2013 0420   CO2 29 05/28/2013 0928   CO2 22 05/23/2013 0455   CO2 21 05/22/2013 0420   GLUCOSE 92 05/28/2013 0928   GLUCOSE 82 05/23/2013 0455   GLUCOSE 88 05/22/2013 0420   BUN 14 05/28/2013 0928   BUN 18 05/23/2013 0455   BUN 18 05/22/2013 0420   CREATININE 1.46* 05/28/2013 0928   CREATININE 1.37* 05/23/2013 0455   CREATININE 1.48* 05/22/2013 0420   CREATININE 1.58* 05/21/2013 0520   CALCIUM 9.8 05/28/2013 0928   CALCIUM 9.0 05/23/2013 0455   CALCIUM 9.1 05/22/2013 0420   GFRNONAA 52* 05/23/2013 0455   GFRNONAA 48* 05/22/2013 0420   GFRNONAA 44* 05/21/2013 0520  GFRAA 61* 05/23/2013 0455   GFRAA 55* 05/22/2013 0420   GFRAA 51* 05/21/2013 0520   CMP     Component Value Date/Time   NA 140 05/28/2013 0928   K 4.9 05/28/2013 0928   CL 101 05/28/2013 0928   CO2 29 05/28/2013 0928   GLUCOSE 92 05/28/2013 0928   BUN 14 05/28/2013 0928   CREATININE 1.46* 05/28/2013 0928   CREATININE 1.37* 05/23/2013 0455   CALCIUM 9.8 05/28/2013 0928   PROT 5.6* 05/20/2013 0047   ALBUMIN 2.7* 05/20/2013 0047   AST 22 05/20/2013 0047   ALT 18 05/20/2013 0047   ALKPHOS 43 05/20/2013 0047   BILITOT 0.6 05/20/2013 0047   GFRNONAA 52* 05/23/2013 0455   GFRAA 61* 05/23/2013 0455       Component Value Date/Time   WBC 7.2 05/23/2013 0455   WBC 7.5 05/20/2013 0047   WBC 9.6 05/19/2013 1328   HGB 14.0 05/23/2013 0455   HGB 14.0 05/20/2013 0047   HGB 12.9* 05/19/2013 1328   HCT 40.1 05/23/2013 0455   HCT 40.7 05/20/2013 0047   HCT 37.2* 05/19/2013 1328   MCV 89.9 05/23/2013 0455   MCV 90.2 05/20/2013 0047   MCV 89.2 05/19/2013 1328    Lipid Panel     Component Value Date/Time   CHOL 128 05/20/2013  0047   TRIG 92 05/20/2013 0047   HDL 42 05/20/2013 0047   CHOLHDL 3.0 05/20/2013 0047   VLDL 18 05/20/2013 0047   LDLCALC 68 05/20/2013 0047    ABG    Component Value Date/Time   TCO2 16 05/19/2013 1323     Lab Results  Component Value Date   TSH 0.575 05/20/2013   BNP (last 3 results) No results found for this basename: PROBNP,  in the last 8760 hours Cardiac Panel (last 3 results) No results found for this basename: CKTOTAL, CKMB, TROPONINI, RELINDX,  in the last 72 hours  Iron/TIBC/Ferritin No results found for this basename: iron, tibc, ferritin     EKG Orders placed during the hospital encounter of 05/19/13  . EKG 12-LEAD  . EKG 12-LEAD  . EKG 12-LEAD  . EKG 12-LEAD  . EKG 12-LEAD  . EKG 12-LEAD  . EKG 12-LEAD  . EKG 12-LEAD  . EKG 12-LEAD  . EKG 12-LEAD  . EKG 12-LEAD  . EKG 12-LEAD     Prior Assessment and Plan Problem List as of 07/02/2013     Cardiovascular and Mediastinum   Essential hypertension, benign   Last Assessment & Plan   05/29/2013 Office Visit Written 05/29/2013 10:30 AM by Jonelle SidleSamuel G McDowell, MD     Continue current regimen.    Coronary atherosclerosis of native coronary artery   Last Assessment & Plan   05/29/2013 Office Visit Written 05/29/2013 10:30 AM by Jonelle SidleSamuel G McDowell, MD     Stable at this point, recent history reviewed. He is status post DES x 2 to the RCA in the setting of acute inferior wall infarct with staged DES to the obtuse marginal during hospitalization. LVEF is in normal range. Continue current medications, referral made to cardiac rehabilitation. Discussed diet and exercise. Followup arranged.      Genitourinary   CKD (chronic kidney disease), stage II   Last Assessment & Plan   05/29/2013 Office Visit Written 05/29/2013 10:30 AM by Jonelle SidleSamuel G McDowell, MD     Creatinine 1.4 by most recent assessment.      Other   Thrombocytopenia   History of inferior wall myocardial infarction  Imaging: No results found.

## 2013-07-02 NOTE — Progress Notes (Signed)
    ERROR PATIENT RESCHEDULED. 

## 2013-07-02 NOTE — Assessment & Plan Note (Signed)
The patient is doing well overall concerning symptoms. He is tolerating cardiac rehabilitation well. He is concerned about his blood pressure elevation. I talked with him about the reason he has been placed on metoprolol and the reason for dual antiplatelet therapy. He verbalizes understanding. He is continued on healthy lifestyle and try to avoid high cholesterol and salt rich foods.

## 2013-07-02 NOTE — Patient Instructions (Signed)
Your physician recommends that you schedule a follow-up appointment in: 1 month    Your physician has recommended you make the following change in your medication:    Take Lisinopril 20 mg daily (cut your 40 mg tablet in half = 20 mg)    Please return in 1 week for nurse apt to review your blood pressure recordings and to check your BP

## 2013-07-02 NOTE — Assessment & Plan Note (Signed)
I have reviewed his blood pressure recordings from cardiac rehabilitation, and he is averaging 140-150 systolic with some peaks up to 170 systolic. I will reinstitute lisinopril at 20 mg daily. He has 40 mg at home, which she will cut in half. They will take his blood pressure daily at the same time every day. He will return in one week 2% his blood pressure recordings. He will need to have a followup BMET which will be ordered after his reinstitution of lisinopril and blood pressure review. He is advised that we will not place him back on HCTZ at this time.

## 2013-07-02 NOTE — Assessment & Plan Note (Signed)
Careful reinstitution of ACE inhibitor for blood pressure control in the setting of known CAD and inferior MI. He will see [place] again in one month for reevaluation, he will have a BMET completed in one week after evaluation of his blood pressure control.

## 2013-07-02 NOTE — Progress Notes (Signed)
HPI:Keith Keith is a 68 y/o patient of Dr. Diona Browner with known history of  Inferior wall MI, with severe RCA disease, with DES x 2 and staged DES intervention to OM. He was treated with DAPT. He was referred to cardiac rehab. In cardiac rehabilitation he has noticed his blood pressure has been rising. This is of concern for him as he normally runs in the 115-120's  systolically. He brings with him a list of his blood pressures from cardiac rehabilitation where he is averaging in the 140s to 160s, with some as high as 170s. Although blood pressure fluctuations are expected, he remains elevated. He been on lisinopril 40 mg daily, and HCTZ prior to having MI and institution of metoprolol. His medications were discontinued after discharge.  The patient denies chest pain dyspnea on exertion or fatigue. The patient plays golf, and continues to cardiac rehabilitation and is enjoying it. He is concerned mostly his blood pressure elevation which is not normal for him.      No Known Allergies  Current Outpatient Prescriptions  Medication Sig Dispense Refill  . aspirin EC 81 MG tablet Take 81 mg by mouth daily.      Marland Kitchen atorvastatin (LIPITOR) 40 MG tablet Take 1 tablet (40 mg total) by mouth every evening.  30 tablet  6  . Cholecalciferol (VITAMIN D-3) 1000 UNITS CAPS Take 1,000 Units by mouth daily.       . metoprolol tartrate (LOPRESSOR) 25 MG tablet Take 1 tablet (25 mg total) by mouth 2 (two) times daily.  60 tablet  6  . nitroGLYCERIN (NITROSTAT) 0.4 MG SL tablet Place 1 tablet (0.4 mg total) under the tongue every 5 (five) minutes as needed for chest pain (up to 3 doses).  25 tablet  3  . omeprazole (PRILOSEC) 20 MG capsule Take 20 mg by mouth 2 (two) times daily before a meal.      . ticagrelor (BRILINTA) 90 MG TABS tablet Take 1 tablet (90 mg total) by mouth 2 (two) times daily.  180 tablet  3  . vitamin B-12 (CYANOCOBALAMIN) 1000 MCG tablet Take 1,000 mcg by mouth daily.       No current  facility-administered medications for this visit.    Past Medical History  Diagnosis Date  . Essential hypertension, benign   . Thrombocytopenia   . CKD (chronic kidney disease), stage II   . Coronary atherosclerosis of native coronary artery     DES x 2 to RCA on 05/19/13, staged DES to OM 05/22/13, residual LAD/diag disease for medical management  . Inferior myocardial infarction     March 2015    Past Surgical History  Procedure Laterality Date  . Bilateral hip replacements    . Cardiac catheterization    . Coronary stent placement  05/19/13    ROS:  Review of systems complete and found to be negative unless listed above  PHYSICAL EXAM BP 142/86  Pulse 60  Ht 5\' 6"  (1.676 m) General: Well developed, well nourished, in no acute distress Head: Eyes PERRLA, No xanthomas.   Normal cephalic and atramatic  Lungs: Clear bilaterally to auscultation and percussion. Heart: HRRR S1 S2, without MRG.  Pulses are 2+ & equal.            No carotid bruit. No JVD.  No abdominal bruits. No femoral bruits. Abdomen: Bowel sounds are positive, abdomen soft and non-tender without masses or  Hernia's noted. Msk:  Back normal, normal gait. Normal strength and tone for age. Extremities: No clubbing, cyanosis or edema.  DP +1 Neuro: Alert and oriented X 3. Psych:  Good affect, responds appropriately   ASSESSMENT AND PLAN

## 2013-07-03 ENCOUNTER — Encounter (HOSPITAL_COMMUNITY)
Admission: RE | Admit: 2013-07-03 | Discharge: 2013-07-03 | Disposition: A | Discharge status: Home / Self Care | Payer: Medicare Other | Source: Ambulatory Visit | Attending: Cardiology | Admitting: Cardiology

## 2013-07-05 ENCOUNTER — Encounter (HOSPITAL_COMMUNITY)
Admission: RE | Admit: 2013-07-05 | Discharge: 2013-07-05 | Disposition: A | Discharge status: Home / Self Care | Payer: Medicare Other | Source: Ambulatory Visit | Attending: Cardiology | Admitting: Cardiology

## 2013-07-08 ENCOUNTER — Encounter (HOSPITAL_COMMUNITY)
Admission: RE | Admit: 2013-07-08 | Discharge: 2013-07-08 | Disposition: A | Discharge status: Home / Self Care | Payer: Medicare Other | Source: Ambulatory Visit | Attending: Cardiology | Admitting: Cardiology

## 2013-07-09 ENCOUNTER — Ambulatory Visit (INDEPENDENT_AMBULATORY_CARE_PROVIDER_SITE_OTHER): Payer: Medicare Other

## 2013-07-09 VITALS — BP 183/82 | HR 55

## 2013-07-09 DIAGNOSIS — I10 Essential (primary) hypertension: Secondary | ICD-10-CM

## 2013-07-09 MED ORDER — HYDROCHLOROTHIAZIDE 12.5 MG PO CAPS: 12.5000 mg | ORAL_CAPSULE | Freq: Every day | ORAL | Status: DC

## 2013-07-09 NOTE — Progress Notes (Signed)
BP check both arms,manual and machine see VS   Per K.Lawrence NP, pt will start HCTZ 12.5 mg daily with BP check in 1 week and repeat bmet at that time   Pt will keep BP log and will follow low sodium guideline pamphlet given to him

## 2013-07-09 NOTE — Patient Instructions (Signed)
Your physician recommends that you schedule a follow-up appointment in: next Wednesday 5/27 at 4 pm with nurse for BP check    Your physician has recommended you make the following change in your medication:    START HCTZ 12.5 mg daily  Please get blood work Designer, jewellery) in one week -  07/17/13   Please keep BP log and bring to next appointment    Thank you for choosing [facility] !

## 2013-07-09 NOTE — Progress Notes (Signed)
Reviewed blood pressures. HCTZ 12.5 mg added with follow up BMET. See on next scheduled appt and continue BP checks in cardiac rehab.

## 2013-07-10 ENCOUNTER — Encounter (HOSPITAL_COMMUNITY)
Admission: RE | Admit: 2013-07-10 | Discharge: 2013-07-10 | Disposition: A | Discharge status: Home / Self Care | Payer: Medicare Other | Source: Ambulatory Visit | Attending: Cardiology | Admitting: Cardiology

## 2013-07-12 ENCOUNTER — Encounter: Payer: Self-pay | Admitting: Adult Health

## 2013-07-12 ENCOUNTER — Encounter (HOSPITAL_COMMUNITY): Payer: Medicare Other

## 2013-07-15 ENCOUNTER — Encounter (HOSPITAL_COMMUNITY): Payer: Medicare Other

## 2013-07-17 ENCOUNTER — Ambulatory Visit (INDEPENDENT_AMBULATORY_CARE_PROVIDER_SITE_OTHER): Payer: Medicare Other | Admitting: *Deleted

## 2013-07-17 ENCOUNTER — Encounter (HOSPITAL_COMMUNITY)
Admission: RE | Admit: 2013-07-17 | Discharge: 2013-07-17 | Disposition: A | Discharge status: Home / Self Care | Payer: Medicare Other | Source: Ambulatory Visit | Attending: Cardiology | Admitting: Cardiology

## 2013-07-17 VITALS — BP 138/84 | HR 57

## 2013-07-17 DIAGNOSIS — I10 Essential (primary) hypertension: Secondary | ICD-10-CM

## 2013-07-17 LAB — BASIC METABOLIC PANEL
BUN: 15 mg/dL (ref 6–23)
CHLORIDE: 100 meq/L (ref 96–112)
CO2: 29 meq/L (ref 19–32)
Calcium: 9.7 mg/dL (ref 8.4–10.5)
Creat: 1.55 mg/dL — ABNORMAL HIGH (ref 0.50–1.35)
Glucose, Bld: 66 mg/dL — ABNORMAL LOW (ref 70–99)
Potassium: 4.1 mEq/L (ref 3.5–5.3)
SODIUM: 139 meq/L (ref 135–145)

## 2013-07-17 MED ORDER — LISINOPRIL 20 MG PO TABS: 20.0000 mg | ORAL_TABLET | Freq: Every day | ORAL | Status: DC

## 2013-07-17 MED ORDER — LISINOPRIL-HYDROCHLOROTHIAZIDE 20-12.5 MG PO TABS: 1.0000 | ORAL_TABLET | Freq: Every day | ORAL | Status: DC

## 2013-07-17 NOTE — Progress Notes (Signed)
Noted  

## 2013-07-17 NOTE — Progress Notes (Signed)
BP log reviewed by K.lawrence NP  Pt to fu 08/27/13 with Dr.McDowell as planned  No change in medications  Await BMET done this am

## 2013-07-17 NOTE — Patient Instructions (Addendum)
Keep follow apt as planned with Dr.McDowell on 08/27/13   No change in your medications    We will call you with your blood test (BMET) results

## 2013-07-18 ENCOUNTER — Other Ambulatory Visit: Payer: Self-pay

## 2013-07-18 DIAGNOSIS — I10 Essential (primary) hypertension: Secondary | ICD-10-CM

## 2013-07-19 ENCOUNTER — Encounter (HOSPITAL_COMMUNITY)
Admission: RE | Admit: 2013-07-19 | Discharge: 2013-07-19 | Disposition: A | Discharge status: Home / Self Care | Payer: Medicare Other | Source: Ambulatory Visit | Attending: Cardiology | Admitting: Cardiology

## 2013-07-22 ENCOUNTER — Encounter (HOSPITAL_COMMUNITY)
Admission: RE | Admit: 2013-07-22 | Discharge: 2013-07-22 | Disposition: A | Discharge status: Home / Self Care | Payer: Medicare Other | Source: Ambulatory Visit | Attending: Cardiology | Admitting: Cardiology

## 2013-07-22 DIAGNOSIS — I214 Non-ST elevation (NSTEMI) myocardial infarction: Secondary | ICD-10-CM | POA: Insufficient documentation

## 2013-07-22 DIAGNOSIS — Z9861 Coronary angioplasty status: Secondary | ICD-10-CM | POA: Insufficient documentation

## 2013-07-24 ENCOUNTER — Encounter (HOSPITAL_COMMUNITY): Payer: Medicare Other

## 2013-07-26 ENCOUNTER — Encounter (HOSPITAL_COMMUNITY)
Admission: RE | Admit: 2013-07-26 | Discharge: 2013-07-26 | Disposition: A | Discharge status: Home / Self Care | Payer: Medicare Other | Source: Ambulatory Visit | Attending: Cardiology | Admitting: Cardiology

## 2013-07-29 ENCOUNTER — Encounter (HOSPITAL_COMMUNITY): Payer: Medicare Other

## 2013-07-31 ENCOUNTER — Encounter (HOSPITAL_COMMUNITY)
Admission: RE | Admit: 2013-07-31 | Discharge: 2013-07-31 | Disposition: A | Discharge status: Home / Self Care | Payer: Medicare Other | Source: Ambulatory Visit | Attending: Cardiology | Admitting: Cardiology

## 2013-08-02 ENCOUNTER — Encounter (HOSPITAL_COMMUNITY)
Admission: RE | Admit: 2013-08-02 | Discharge: 2013-08-02 | Disposition: A | Discharge status: Home / Self Care | Payer: Medicare Other | Source: Ambulatory Visit | Attending: Cardiology | Admitting: Cardiology

## 2013-08-02 ENCOUNTER — Ambulatory Visit: Payer: Medicare Other | Admitting: Adult Health

## 2013-08-05 ENCOUNTER — Encounter (HOSPITAL_COMMUNITY)
Admission: RE | Admit: 2013-08-05 | Discharge: 2013-08-05 | Disposition: A | Discharge status: Home / Self Care | Payer: Medicare Other | Source: Ambulatory Visit | Attending: Cardiology | Admitting: Cardiology

## 2013-08-07 ENCOUNTER — Encounter (HOSPITAL_COMMUNITY)
Admission: RE | Admit: 2013-08-07 | Discharge: 2013-08-07 | Disposition: A | Discharge status: Home / Self Care | Payer: Medicare Other | Source: Ambulatory Visit | Attending: Cardiology | Admitting: Cardiology

## 2013-08-09 ENCOUNTER — Encounter (HOSPITAL_COMMUNITY): Payer: Medicare Other

## 2013-08-12 ENCOUNTER — Encounter (HOSPITAL_COMMUNITY)
Admission: RE | Admit: 2013-08-12 | Discharge: 2013-08-12 | Disposition: A | Discharge status: Home / Self Care | Payer: Medicare Other | Source: Ambulatory Visit | Attending: Cardiology | Admitting: Cardiology

## 2013-08-14 ENCOUNTER — Encounter (HOSPITAL_COMMUNITY)
Admission: RE | Admit: 2013-08-14 | Discharge: 2013-08-14 | Disposition: A | Discharge status: Home / Self Care | Payer: Medicare Other | Source: Ambulatory Visit | Attending: Cardiology | Admitting: Cardiology

## 2013-08-16 ENCOUNTER — Encounter (HOSPITAL_COMMUNITY)
Admission: RE | Admit: 2013-08-16 | Discharge: 2013-08-16 | Disposition: A | Discharge status: Home / Self Care | Payer: Medicare Other | Source: Ambulatory Visit | Attending: Cardiology | Admitting: Cardiology

## 2013-08-19 ENCOUNTER — Encounter (HOSPITAL_COMMUNITY)
Admission: RE | Admit: 2013-08-19 | Discharge: 2013-08-19 | Disposition: A | Discharge status: Home / Self Care | Payer: Medicare Other | Source: Ambulatory Visit | Attending: Cardiology | Admitting: Cardiology

## 2013-08-21 ENCOUNTER — Encounter (HOSPITAL_COMMUNITY)
Admission: RE | Admit: 2013-08-21 | Discharge: 2013-08-21 | Disposition: A | Discharge status: Home / Self Care | Payer: Medicare Other | Source: Ambulatory Visit | Attending: Cardiology | Admitting: Cardiology

## 2013-08-21 DIAGNOSIS — I214 Non-ST elevation (NSTEMI) myocardial infarction: Secondary | ICD-10-CM | POA: Insufficient documentation

## 2013-08-21 DIAGNOSIS — Z9861 Coronary angioplasty status: Secondary | ICD-10-CM | POA: Diagnosis not present

## 2013-08-23 ENCOUNTER — Encounter (HOSPITAL_COMMUNITY): Payer: Medicare Other

## 2013-08-26 ENCOUNTER — Encounter (HOSPITAL_COMMUNITY)
Admission: RE | Admit: 2013-08-26 | Discharge: 2013-08-26 | Disposition: A | Discharge status: Home / Self Care | Payer: Medicare Other | Source: Ambulatory Visit | Attending: Cardiology | Admitting: Cardiology

## 2013-08-26 DIAGNOSIS — I214 Non-ST elevation (NSTEMI) myocardial infarction: Secondary | ICD-10-CM | POA: Diagnosis not present

## 2013-08-27 ENCOUNTER — Ambulatory Visit (INDEPENDENT_AMBULATORY_CARE_PROVIDER_SITE_OTHER): Payer: Medicare Other | Admitting: Cardiology

## 2013-08-27 ENCOUNTER — Encounter: Payer: Self-pay | Admitting: Cardiology

## 2013-08-27 VITALS — BP 140/94 | HR 43 | Ht 65.0 in | Wt 158.0 lb

## 2013-08-27 DIAGNOSIS — I2510 Atherosclerotic heart disease of native coronary artery without angina pectoris: Secondary | ICD-10-CM

## 2013-08-27 DIAGNOSIS — N182 Chronic kidney disease, stage 2 (mild): Secondary | ICD-10-CM

## 2013-08-27 DIAGNOSIS — I10 Essential (primary) hypertension: Secondary | ICD-10-CM

## 2013-08-27 NOTE — Patient Instructions (Signed)
Your physician recommends that you schedule a follow-up appointment in: 1 month      Your physician recommends that you continue on your current medications as directed. Please refer to the Current Medication list given to you today.    Thank you for choosing [facility] !          

## 2013-08-27 NOTE — Assessment & Plan Note (Signed)
Most recent creatinine relatively stable at 1.5.

## 2013-08-27 NOTE — Progress Notes (Signed)
Clinical Summary [name] is a 68 y.o.male  Last seen by Ms. Lawrence NP in may of this year. At that time he was seen with concerns about increasing blood pressure noted in cardiac rehabilitation. He was started back on lisinopril 20 mg daily at that point. He had previously been on higher dose with HCTZ, although has history of renal insufficiency. Since that time low-dose HCTZ was also added for better blood pressure control.  Followup lab work in late May showed potassium 4.1, BUN 15, creatinine 1.5 which was relatively stable.  He is here with his wife today. Reports his blood pressure has come down nicely, reflected in cardiac rehabilitation and also on his home blood pressure checks. Today's blood pressure was up somewhat, he said that he "rushed to get here." He reports tolerating his medications.  He finishes up cardiac rehabilitation over the next month. Plans to go back to work after that.   No Known Allergies  Current Outpatient Prescriptions  Medication Sig Dispense Refill  . aspirin EC 81 MG tablet Take 81 mg by mouth daily.      Marland Kitchen. atorvastatin (LIPITOR) 40 MG tablet Take 1 tablet (40 mg total) by mouth every evening.  30 tablet  6  . Cholecalciferol (VITAMIN D-3) 1000 UNITS CAPS Take 1,000 Units by mouth daily.       . hydrochlorothiazide (MICROZIDE) 12.5 MG capsule Take 1 capsule (12.5 mg total) by mouth daily.  90 capsule  3  . lisinopril (PRINIVIL,ZESTRIL) 20 MG tablet Take 1 tablet (20 mg total) by mouth daily.  90 tablet  3  . metoprolol tartrate (LOPRESSOR) 25 MG tablet Take 1 tablet (25 mg total) by mouth 2 (two) times daily.  60 tablet  6  . nitroGLYCERIN (NITROSTAT) 0.4 MG SL tablet Place 1 tablet (0.4 mg total) under the tongue every 5 (five) minutes as needed for chest pain (up to 3 doses).  25 tablet  3  . omeprazole (PRILOSEC) 20 MG capsule Take 20 mg by mouth 2 (two) times daily before a meal.      . ticagrelor (BRILINTA) 90 MG TABS tablet Take 1 tablet (90  mg total) by mouth 2 (two) times daily.  180 tablet  3  . vitamin B-12 (CYANOCOBALAMIN) 1000 MCG tablet Take 1,000 mcg by mouth daily.       No current facility-administered medications for this visit.    Past Medical History  Diagnosis Date  . Essential hypertension, benign   . Thrombocytopenia   . CKD (chronic kidney disease), stage II   . Coronary atherosclerosis of native coronary artery     DES x 2 to RCA on 05/19/13, staged DES to OM 05/22/13, residual LAD/diag disease for medical management  . Inferior myocardial infarction     March 2015    Social History [name] reports that he quit smoking about 11 years ago. His smoking use included Cigarettes. He has a 38 pack-year smoking history. He has never used smokeless tobacco. [name] reports that he drinks about .6 ounces of alcohol per week.  Review of Systems No palpitations, unusual shortness of breath, syncope, claudication. Musculoskeletal sounding left shoulder discomfort reported intermittently. States he may have a problem with his "rotator cuff." Other systems reviewed and negative.  Physical Examination Filed Vitals:   08/27/13 0856  BP: 140/94  Pulse: 43   Filed Weights   08/27/13 0856  Weight: 158 lb (71.668 kg)    Patient appears comfortable at rest.  HEENT:  Conjunctiva and lids normal, oropharynx clear.  Neck: Supple, no elevated JVP or carotid bruits, no thyromegaly.  Lungs: Clear to auscultation, nonlabored breathing at rest.  Cardiac: Regular rate and rhythm, no S3 or significant systolic murmur, no pericardial rub.  Extremities: No pitting edema, distal pulses 2+. Skin: Warm and dry.  Musculoskeletal: No kyphosis.  Neuropsychiatric: Alert and oriented x3, affect grossly appropriate.   Problem List and Plan   Coronary atherosclerosis of native coronary artery Symptomatically stable on medical therapy. I encouraged him to complete cardiac rehabilitation, he is on schedule. Followup arranged to  review prior to going back to work.  Essential hypertension, benign Continue current regimen, keep check on blood pressure.  CKD (chronic kidney disease), stage II Most recent creatinine relatively stable at 1.5.    Jonelle Sidle, M.D., F.A.C.C.

## 2013-08-27 NOTE — Assessment & Plan Note (Signed)
Symptomatically stable on medical therapy. I encouraged him to complete cardiac rehabilitation, he is on schedule. Followup arranged to review prior to going back to work.

## 2013-08-27 NOTE — Assessment & Plan Note (Signed)
Continue current regimen, keep check on blood pressure.

## 2013-08-28 ENCOUNTER — Encounter (HOSPITAL_COMMUNITY)
Admission: RE | Admit: 2013-08-28 | Discharge: 2013-08-28 | Disposition: A | Discharge status: Home / Self Care | Payer: Medicare Other | Source: Ambulatory Visit | Attending: Cardiology | Admitting: Cardiology

## 2013-08-28 DIAGNOSIS — I214 Non-ST elevation (NSTEMI) myocardial infarction: Secondary | ICD-10-CM | POA: Diagnosis not present

## 2013-08-30 ENCOUNTER — Encounter (HOSPITAL_COMMUNITY)
Admission: RE | Admit: 2013-08-30 | Discharge: 2013-08-30 | Disposition: A | Discharge status: Home / Self Care | Payer: Medicare Other | Source: Ambulatory Visit | Attending: Cardiology | Admitting: Cardiology

## 2013-08-30 DIAGNOSIS — I214 Non-ST elevation (NSTEMI) myocardial infarction: Secondary | ICD-10-CM | POA: Diagnosis not present

## 2013-09-02 ENCOUNTER — Encounter (HOSPITAL_COMMUNITY)
Admission: RE | Admit: 2013-09-02 | Discharge: 2013-09-02 | Disposition: A | Discharge status: Home / Self Care | Payer: Medicare Other | Source: Ambulatory Visit | Attending: Cardiology | Admitting: Cardiology

## 2013-09-02 DIAGNOSIS — I214 Non-ST elevation (NSTEMI) myocardial infarction: Secondary | ICD-10-CM | POA: Diagnosis not present

## 2013-09-04 ENCOUNTER — Encounter (HOSPITAL_COMMUNITY)
Admission: RE | Admit: 2013-09-04 | Discharge: 2013-09-04 | Disposition: A | Discharge status: Home / Self Care | Payer: Medicare Other | Source: Ambulatory Visit | Attending: Cardiology | Admitting: Cardiology

## 2013-09-04 DIAGNOSIS — I214 Non-ST elevation (NSTEMI) myocardial infarction: Secondary | ICD-10-CM | POA: Diagnosis not present

## 2013-09-06 ENCOUNTER — Encounter (HOSPITAL_COMMUNITY)
Admission: RE | Admit: 2013-09-06 | Discharge: 2013-09-06 | Disposition: A | Discharge status: Home / Self Care | Payer: Medicare Other | Source: Ambulatory Visit | Attending: Cardiology | Admitting: Cardiology

## 2013-09-06 DIAGNOSIS — I214 Non-ST elevation (NSTEMI) myocardial infarction: Secondary | ICD-10-CM | POA: Diagnosis not present

## 2013-09-09 ENCOUNTER — Encounter (HOSPITAL_COMMUNITY)
Admission: RE | Admit: 2013-09-09 | Discharge: 2013-09-09 | Disposition: A | Discharge status: Home / Self Care | Payer: Medicare Other | Source: Ambulatory Visit | Attending: Cardiology | Admitting: Cardiology

## 2013-09-09 DIAGNOSIS — I214 Non-ST elevation (NSTEMI) myocardial infarction: Secondary | ICD-10-CM | POA: Diagnosis not present

## 2013-09-10 NOTE — Addendum Note (Signed)
Encounter addended by: Angelica Pou, RN on: 09/10/2013  8:19 AM<BR>     Documentation filed: Clinical Notes

## 2013-09-10 NOTE — Addendum Note (Signed)
Encounter addended by: Angelica Pou, RN on: 09/10/2013  9:41 AM<BR>     Documentation filed: Clinical Notes

## 2013-09-10 NOTE — Addendum Note (Signed)
Encounter addended by: Angelica Pou, RN on: 09/10/2013  8:18 AM<BR>     Documentation filed: Notes Section

## 2013-09-10 NOTE — Progress Notes (Signed)
Cardiac Rehabilitation Program Outcomes Report   Orientation:  06/06/2013 1st Week report: 06/17/2013 Graduate Date: tbd  Discharge Date:  tbd # of sessions completed: 3 DX: CAD STEMI/StentX2  Cardiologist: Hermina Staggers MD:  Francene Castle Time:  09:30  A.  Exercise Program:  Tolerates exercise @ 3.74 METS for 15 minutes and Walk Test Results:  Pre: Pre walk test: Rest HR 74, BP 120/80, O2 98%, RPE 6 and RPD 6, 6 min HR 81, BP 150/88, O2 98%, RPE 9 and RPD 9, Post HR 72, BP 132/84, O2 99%, RPE 6 and RPD 6. Walked 1000 feet at 1.89 mph at 2.45 METS.  B.  Mental Health:  Good mental attitude  C.  Education/Instruction/Skills  Accurately checks own pulse.  Rest:  56  Exercise:  101 Knows THR for exercise and Uses Perceived Exertion Scale and/or Dyspnea Scale  Uses Perceived Exertion Scale and/or Dyspnea Scale  D.  Nutrition/Weight Control/Body Composition:  Adherence to prescribed nutrition program: good    E.  Blood Lipids    Lab Results  Component Value Date   CHOL 128 05/20/2013   HDL 42 05/20/2013   LDLCALC 68 05/20/2013   TRIG 92 05/20/2013   CHOLHDL 3.0 05/20/2013    F.  Lifestyle Changes:  Making positive lifestyle changes and Not smoking:  Quit 2004  G.  Symptoms noted with exercise:  Asymptomatic  Report Completed By:  Lelon Huh. Clodagh Odenthal RN   Comments:  This patients 1st week Report. He has done well. He achieved a peak METS of 3.74. His resting HR was 67 and His Resting BP was 140/90. Peak HR was 101 and peak BP was 162/82. A Halfway report will follow upon his 18th visit.

## 2013-09-10 NOTE — Addendum Note (Signed)
Encounter addended by: Angelica Pou, RN on: 09/10/2013  9:40 AM<BR>     Documentation filed: Notes Section

## 2013-09-10 NOTE — Progress Notes (Signed)
Cardiac Rehabilitation Program Outcomes Report   Orientation:  06/06/2013 Halfway report: 07/31/2013 Graduate Date:  tbd Discharge Date:  tbd # of sessions completed: 18 Dx: CAD/STEMI/ StentX 2  Cardiologist: Hermina Staggers MD:  Francene Castle Time:  09:30  A.  Exercise Program:  Tolerates exercise @ 3.74 METS for 15 minutes  B.  Mental Health:  Good mental attitude  C.  Education/Instruction/Skills  Accurately checks own pulse.  Rest:  75  Exercise:  90, Knows THR for exercise and Uses Perceived Exertion Scale and/or Dyspnea Scale  Uses Perceived Exertion Scale and/or Dyspnea Scale  D.  Nutrition/Weight Control/Body Composition:  Adherence to prescribed nutrition program: good    E.  Blood Lipids    Lab Results  Component Value Date   CHOL 128 05/20/2013   HDL 42 05/20/2013   LDLCALC 68 05/20/2013   TRIG 92 05/20/2013   CHOLHDL 3.0 05/20/2013    F.  Lifestyle Changes:  Making positive lifestyle changes and Not smoking:  Quit 2004  G.  Symptoms noted with exercise:  Asymptomatic  Report Completed By:  Lelon Huh. Chanae Gemma RN   Comments:  This is patients halfway report. He has done well while in rehab. He achieved a peak METS of 3.74. His resting HR was 75 and resting BP was 134/70, His peak HR was 90 and his peak BP was 144/74. A graduation report will follow upon his 36th visit

## 2013-09-11 ENCOUNTER — Encounter (HOSPITAL_COMMUNITY)
Admission: RE | Admit: 2013-09-11 | Discharge: 2013-09-11 | Disposition: A | Discharge status: Home / Self Care | Payer: Medicare Other | Source: Ambulatory Visit | Attending: Cardiology | Admitting: Cardiology

## 2013-09-11 DIAGNOSIS — I214 Non-ST elevation (NSTEMI) myocardial infarction: Secondary | ICD-10-CM | POA: Diagnosis not present

## 2013-09-13 ENCOUNTER — Encounter (HOSPITAL_COMMUNITY)
Admission: RE | Admit: 2013-09-13 | Discharge: 2013-09-13 | Disposition: A | Discharge status: Home / Self Care | Payer: Medicare Other | Source: Ambulatory Visit | Attending: Cardiology | Admitting: Cardiology

## 2013-09-13 DIAGNOSIS — I214 Non-ST elevation (NSTEMI) myocardial infarction: Secondary | ICD-10-CM | POA: Diagnosis not present

## 2013-09-16 ENCOUNTER — Encounter (HOSPITAL_COMMUNITY)
Admission: RE | Admit: 2013-09-16 | Discharge: 2013-09-16 | Disposition: A | Discharge status: Home / Self Care | Payer: Medicare Other | Source: Ambulatory Visit | Attending: Cardiology | Admitting: Cardiology

## 2013-09-16 DIAGNOSIS — I214 Non-ST elevation (NSTEMI) myocardial infarction: Secondary | ICD-10-CM | POA: Diagnosis not present

## 2013-09-23 ENCOUNTER — Telehealth: Payer: Self-pay | Admitting: *Deleted

## 2013-09-23 ENCOUNTER — Other Ambulatory Visit: Payer: Self-pay

## 2013-09-23 NOTE — Telephone Encounter (Signed)
Pt to pick up prescription at front desk

## 2013-09-23 NOTE — Telephone Encounter (Signed)
Pt walked in today to request a hand written RX for hydrochlorothiazide 12.5mg . Pt needs it now.

## 2013-11-26 ENCOUNTER — Ambulatory Visit (INDEPENDENT_AMBULATORY_CARE_PROVIDER_SITE_OTHER): Payer: Medicare Other | Admitting: Cardiology

## 2013-11-26 ENCOUNTER — Encounter: Payer: Self-pay | Admitting: Cardiology

## 2013-11-26 VITALS — BP 124/80 | HR 62 | Ht 65.0 in | Wt 163.0 lb

## 2013-11-26 DIAGNOSIS — I2510 Atherosclerotic heart disease of native coronary artery without angina pectoris: Secondary | ICD-10-CM

## 2013-11-26 DIAGNOSIS — I10 Essential (primary) hypertension: Secondary | ICD-10-CM

## 2013-11-26 NOTE — Patient Instructions (Signed)
Your physician wants you to follow-up in: 4 months You will receive a reminder letter in the mail two months in advance. If you don't receive a letter, please call our office to schedule the follow-up appointment.   Your physician recommends that you continue on your current medications as directed. Please refer to the Current Medication list given to you today.      Thank you for choosing [facility] !         

## 2013-11-26 NOTE — Progress Notes (Signed)
    Clinical Summary Mr. Barrus is a 68 y.o.male last seen in July. He is doing very well, completed cardiac rehabilitation. He is playing golf regularly without angina symptoms, has retired. He reports compliance with his medications. He will be seeing Dr. Sherryll Burger for a routine visit later this month.  He continues on DAPT, reports no spontaneous bleeding problems.   No Known Allergies  Current Outpatient Prescriptions  Medication Sig Dispense Refill  . aspirin EC 81 MG tablet Take 81 mg by mouth daily.      Marland Kitchen atorvastatin (LIPITOR) 40 MG tablet Take 1 tablet (40 mg total) by mouth every evening.  30 tablet  6  . Cholecalciferol (VITAMIN D-3) 1000 UNITS CAPS Take 1,000 Units by mouth daily.       . hydrochlorothiazide (MICROZIDE) 12.5 MG capsule Take 1 capsule (12.5 mg total) by mouth daily.  90 capsule  3  . lisinopril (PRINIVIL,ZESTRIL) 20 MG tablet Take 1 tablet (20 mg total) by mouth daily.  90 tablet  3  . metoprolol tartrate (LOPRESSOR) 25 MG tablet Take 1 tablet (25 mg total) by mouth 2 (two) times daily.  60 tablet  6  . nitroGLYCERIN (NITROSTAT) 0.4 MG SL tablet Place 1 tablet (0.4 mg total) under the tongue every 5 (five) minutes as needed for chest pain (up to 3 doses).  25 tablet  3  . omeprazole (PRILOSEC) 20 MG capsule Take 20 mg by mouth 2 (two) times daily before a meal.      . ticagrelor (BRILINTA) 90 MG TABS tablet Take 1 tablet (90 mg total) by mouth 2 (two) times daily.  180 tablet  3  . vitamin B-12 (CYANOCOBALAMIN) 1000 MCG tablet Take 1,000 mcg by mouth daily.       No current facility-administered medications for this visit.    Past Medical History  Diagnosis Date  . Essential hypertension, benign   . Thrombocytopenia   . CKD (chronic kidney disease), stage II   . Coronary atherosclerosis of native coronary artery     DES x 2 to RCA on 05/19/13, staged DES to OM 05/22/13, residual LAD/diag disease for medical management  . Inferior myocardial infarction     March  2015    Social History Mr. Barrientes reports that he quit smoking about 11 years ago. His smoking use included Cigarettes. He has a 38 pack-year smoking history. He has never used smokeless tobacco. Mr. Desantos reports that he drinks about .6 ounces of alcohol per week.  Review of Systems No palpitations, dizziness, syncope. Other systems reviewed and negative except as outlined.  Physical Examination Filed Vitals:   11/26/13 0945  BP: 124/80  Pulse: 62   Filed Weights   11/26/13 0945  Weight: 163 lb (73.936 kg)    Patient appears comfortable at rest.  HEENT: Conjunctiva and lids normal, oropharynx clear.  Neck: Supple, no elevated JVP or carotid bruits, no thyromegaly.  Lungs: Clear to auscultation, nonlabored breathing at rest.  Cardiac: Regular rate and rhythm, no S3 or significant systolic murmur, no pericardial rub.  Extremities: No pitting edema, distal pulses 2+.  Skin: Warm and dry.  Musculoskeletal: No kyphosis.  Neuropsychiatric: Alert and oriented x3, affect grossly appropriate.   Problem List and Plan   Coronary atherosclerosis of native coronary artery Symptomatically stable on medical therapy. Continue current regimen, follow up in 4 months.  Essential hypertension, benign Blood pressure well controlled today.    Jonelle Sidle, M.D., F.A.C.C.

## 2013-11-26 NOTE — Assessment & Plan Note (Signed)
Blood pressure well-controlled today. 

## 2013-11-26 NOTE — Assessment & Plan Note (Signed)
Symptomatically stable on medical therapy. Continue current regimen, follow up in 4 months.

## 2014-01-30 ENCOUNTER — Encounter (HOSPITAL_COMMUNITY): Payer: Self-pay | Admitting: Cardiovascular Disease

## 2014-02-04 NOTE — Progress Notes (Signed)
Cardiac Rehabilitation Program Outcomes Report   Orientation:  06/06/13 Graduate Date:  09/16/13 Discharge Date:  09/16/13 # of sessions completed: 36  Cardiologist: Diona Browner Family MD:  Francene Castle Time:  0930  A.  Exercise Program:  Tolerates exercise @ 3.75 METS for 15 minutes and Walk Test Results:  Post: 3.03 mets  B.  Mental Health:  Good mental attitude  C.  Education/Instruction/Skills  Accurately checks own pulse.  Rest:  74  Exercise:  124, Knows THR for exercise and Attended 12 education classes  Uses Perceived Exertion Scale and/or Dyspnea Scale  D.  Nutrition/Weight Control/Body Composition:  Adherence to prescribed nutrition program: good    E.  Blood Lipids    Lab Results  Component Value Date   CHOL 128 05/20/2013   HDL 42 05/20/2013   LDLCALC 68 05/20/2013   TRIG 92 05/20/2013   CHOLHDL 3.0 05/20/2013    F.  Lifestyle Changes:  Making positive lifestyle changes  G.  Symptoms noted with exercise:  Asymptomatic  Report Completed By:  Doretha Sou RN    Comments:  Patient has graduated CR today with 36 sessions.

## 2014-02-04 NOTE — Progress Notes (Signed)
Patient is discharged from [facility] program today September 16, 2013, with 36 sessions.  He achieved LTG of 30 minutes of aerobic exercise at max met level of 3.75.  All patient vitals are WNL.  Patient has not met with dietician.  Discharge instructions have been reviewed in detail and patient expressed an understanding of material given by D Coad EP.  Patient plans to exercise at home and possibly join the maintenance program. Cardiac Rehab will make 1 month, 6 month and 1 year call backs.  Patient had no complaints of any abnormal S/S or pain on their exit visit.

## 2014-02-04 NOTE — Addendum Note (Signed)
Encounter addended by: Andrey Cota, RN on: 02/04/2014  3:57 PM<BR>     Documentation filed: Notes Section

## 2014-03-26 ENCOUNTER — Encounter: Payer: Self-pay | Admitting: Cardiology

## 2014-03-26 ENCOUNTER — Ambulatory Visit (INDEPENDENT_AMBULATORY_CARE_PROVIDER_SITE_OTHER): Payer: Medicare Other | Admitting: Cardiology

## 2014-03-26 VITALS — BP 126/84 | HR 67 | Ht 65.0 in | Wt 164.0 lb

## 2014-03-26 DIAGNOSIS — I2510 Atherosclerotic heart disease of native coronary artery without angina pectoris: Secondary | ICD-10-CM

## 2014-03-26 DIAGNOSIS — I10 Essential (primary) hypertension: Secondary | ICD-10-CM

## 2014-03-26 DIAGNOSIS — Z136 Encounter for screening for cardiovascular disorders: Secondary | ICD-10-CM

## 2014-03-26 NOTE — Progress Notes (Signed)
Cardiology Office Note  Date: 03/26/2014   ID: Keith Keith., DOB Feb 27, 1945, MRN [id number]  PCP: Keith Peri, MD  Primary Cardiologist: Keith Dell, MD   Chief Complaint  Patient presents with  . Coronary Artery Disease  . Hypertension    History of Present Illness: Keith Keith. is a 69 y.o. male last seen in October 2015. He is here with his wife today for a routine visit. Although he has not been walking as much for exercise, he continues to play golf most days of the week, and does not endorse any decline in functional capacity. He reports occasional chest twinges, lasts a few seconds, not purely exertional, does not sound anginal in description. He does not use nitroglycerin.  We reviewed his medications which are outlined below. As of April 2016 he could consider stopping Brilinta and going to an aspirin daily, however he is very hesitant to make this change and would like to continue on both as long as he is not having any bleeding issues.   Past Medical History  Diagnosis Date  . Essential hypertension, benign   . Thrombocytopenia   . CKD (chronic kidney disease), stage II   . Coronary atherosclerosis of native coronary artery     DES x 2 to RCA on 05/19/13, staged DES to OM 05/22/13, residual LAD/diag disease for medical management  . Inferior myocardial infarction     March 2015    Past Surgical History  Procedure Laterality Date  . Bilateral hip replacements    . Cardiac catheterization    . Coronary stent placement  05/19/13  . Left heart catheterization with coronary angiogram N/A 05/19/2013    Procedure: LEFT HEART CATHETERIZATION WITH CORONARY ANGIOGRAM;  Surgeon: Lennette Bihari, MD;  Location: [facility] CATH LAB;  Service: Cardiovascular;  Laterality: N/A;  . Percutaneous coronary stent intervention (pci-s) N/A 05/22/2013    Procedure: PERCUTANEOUS CORONARY STENT INTERVENTION (PCI-S);  Surgeon: Kathleene Hazel, MD;  Location: [facility] CATH LAB;  Service:  Cardiovascular;  Laterality: N/A;  . Fractional flow reserve wire Right 05/22/2013    Procedure: FRACTIONAL FLOW RESERVE WIRE;  Surgeon: Kathleene Hazel, MD;  Location: [facility] CATH LAB;  Service: Cardiovascular;  Laterality: Right;    Current Outpatient Prescriptions  Medication Sig Dispense Refill  . aspirin EC 81 MG tablet Take 81 mg by mouth daily.    Marland Kitchen atorvastatin (LIPITOR) 40 MG tablet Take 1 tablet (40 mg total) by mouth every evening. 30 tablet 6  . Cholecalciferol (VITAMIN D-3) 1000 UNITS CAPS Take 1,000 Units by mouth daily.     . hydrochlorothiazide (MICROZIDE) 12.5 MG capsule Take 1 capsule (12.5 mg total) by mouth daily. 90 capsule 3  . lisinopril (PRINIVIL,ZESTRIL) 20 MG tablet Take 1 tablet (20 mg total) by mouth daily. 90 tablet 3  . metoprolol tartrate (LOPRESSOR) 25 MG tablet Take 1 tablet (25 mg total) by mouth 2 (two) times daily. 60 tablet 6  . nitroGLYCERIN (NITROSTAT) 0.4 MG SL tablet Place 1 tablet (0.4 mg total) under the tongue every 5 (five) minutes as needed for chest pain (up to 3 doses). 25 tablet 3  . omeprazole (PRILOSEC) 20 MG capsule Take 20 mg by mouth 2 (two) times daily before a meal.    . ticagrelor (BRILINTA) 90 MG TABS tablet Take 1 tablet (90 mg total) by mouth 2 (two) times daily. 180 tablet 3  . vitamin B-12 (CYANOCOBALAMIN) 1000 MCG tablet Take 1,000 mcg by mouth daily.  No current facility-administered medications for this visit.    Allergies:  Review of patient's allergies indicates no known allergies.   Social History: The patient  reports that he quit smoking about 12 years ago. His smoking use included Cigarettes. He started smoking about 48 years ago. He has a 38 pack-year smoking history. He has never used smokeless tobacco. He reports that he drinks about 0.6 oz of alcohol per week. He reports that he does not use illicit drugs.   Family History: The patient's family history includes Aneurysm in his mother; Diabetes in his sister and  sister; Heart disease in his sister.   ROS:  Please see the history of present illness. Otherwise, complete review of systems is positive for none.   All other systems are reviewed and negative.    PHYSICAL EXAM: VS:  BP 126/84 mmHg  Pulse 67  Ht 5\' 5"  (1.651 m)  Wt 164 lb (74.39 kg)  BMI 27.29 kg/m2  SpO2 99%, BMI Body mass index is 27.29 kg/(m^2).  Wt Readings from Last 3 Encounters:  03/26/14 164 lb (74.39 kg)  11/26/13 163 lb (73.936 kg)  08/27/13 158 lb (71.668 kg)     Patient appears comfortable at rest.  HEENT: Conjunctiva and lids normal, oropharynx clear.  Neck: Supple, no elevated JVP or carotid bruits, no thyromegaly.  Lungs: Clear to auscultation, nonlabored breathing at rest.  Cardiac: Regular rate and rhythm, no S3 or significant systolic murmur, no pericardial rub.  Extremities: No pitting edema, distal pulses 2+.  Skin: Warm and dry.    ECG: ECG is ordered today demonstrating normal sinus rhythm.  Recent Labwork: 05/19/2013: Magnesium 2.1 05/20/2013: ALT 18; TSH 0.575 05/23/2013: Hemoglobin 14.0; Platelets 104* 07/17/2013: BUN 15; Creatinine 1.55*; Potassium 4.1; Sodium 139     Component Value Date/Time   CHOL 128 05/20/2013 0047   TRIG 92 05/20/2013 0047   HDL 42 05/20/2013 0047   CHOLHDL 3.0 05/20/2013 0047   VLDL 18 05/20/2013 0047   LDLCALC 68 05/20/2013 0047    Other Studies Reviewed Today:  He tells me that he had lab work done recently through the [place] [facility] with good lipid control, mildly elevated PSA. I do not have the results.   ASSESSMENT AND PLAN:  Coronary atherosclerosis of native coronary artery Symptomatically stable at this time without clear-cut angina symptoms. ECG is normal. We will continue current regimen, he is hesitant to stop Brilinta in April of this year, and will continue DAPT at least for now. I recommended that he get back to his regular walking regimen.   Essential hypertension, benign Blood pressure is  well controlled today.     Current medicines are reviewed at length with the patient today.  The patient does not have concerns regarding medicines.   Orders Placed This Encounter  Procedures  . EKG 12-Lead    Disposition: FU with me in 6 months.   Signed, Keith Dell, MD  03/26/2014 10:49 AM    [facility] at [facility] [address], [place], [place] [address] Phone: [phone]; Fax: [phone]

## 2014-03-26 NOTE — Patient Instructions (Signed)
Your physician wants you to follow-up in: 6 months with Dr. McDowell You will receive a reminder letter in the mail two months in advance. If you don't receive a letter, please call our office to schedule the follow-up appointment.  Your physician recommends that you continue on your current medications as directed. Please refer to the Current Medication list given to you today.  Thank you for choosing [facility]!!    

## 2014-03-26 NOTE — Assessment & Plan Note (Signed)
Blood pressure is well-controlled today. 

## 2014-03-26 NOTE — Assessment & Plan Note (Signed)
Symptomatically stable at this time without clear-cut angina symptoms. ECG is normal. We will continue current regimen, he is hesitant to stop Brilinta in April of this year, and will continue DAPT at least for now. I recommended that he get back to his regular walking regimen.

## 2014-04-18 ENCOUNTER — Ambulatory Visit (INDEPENDENT_AMBULATORY_CARE_PROVIDER_SITE_OTHER): Payer: Medicare Other | Admitting: Urology

## 2014-04-18 DIAGNOSIS — R972 Elevated prostate specific antigen [PSA]: Secondary | ICD-10-CM | POA: Diagnosis not present

## 2014-04-18 DIAGNOSIS — N400 Enlarged prostate without lower urinary tract symptoms: Secondary | ICD-10-CM

## 2014-05-09 ENCOUNTER — Other Ambulatory Visit: Payer: Self-pay | Admitting: Cardiology

## 2014-05-20 ENCOUNTER — Emergency Department (HOSPITAL_COMMUNITY): Payer: Medicare Other

## 2014-05-20 ENCOUNTER — Encounter (HOSPITAL_COMMUNITY): Payer: Self-pay | Admitting: Emergency Medicine

## 2014-05-20 ENCOUNTER — Encounter (HOSPITAL_COMMUNITY)
Admission: EM | Disposition: A | Discharge status: Home / Self Care | Payer: Self-pay | Source: Home / Self Care | Attending: Cardiovascular Disease

## 2014-05-20 ENCOUNTER — Inpatient Hospital Stay (HOSPITAL_COMMUNITY)
Admission: EM | Admit: 2014-05-20 | Discharge: 2014-05-21 | DRG: 247 | Disposition: A | Discharge status: Home / Self Care | Payer: Medicare Other | Attending: Cardiovascular Disease | Admitting: Cardiovascular Disease

## 2014-05-20 DIAGNOSIS — I252 Old myocardial infarction: Secondary | ICD-10-CM | POA: Diagnosis not present

## 2014-05-20 DIAGNOSIS — K219 Gastro-esophageal reflux disease without esophagitis: Secondary | ICD-10-CM | POA: Diagnosis present

## 2014-05-20 DIAGNOSIS — I2510 Atherosclerotic heart disease of native coronary artery without angina pectoris: Secondary | ICD-10-CM

## 2014-05-20 DIAGNOSIS — N182 Chronic kidney disease, stage 2 (mild): Secondary | ICD-10-CM | POA: Diagnosis present

## 2014-05-20 DIAGNOSIS — Z8249 Family history of ischemic heart disease and other diseases of the circulatory system: Secondary | ICD-10-CM | POA: Diagnosis not present

## 2014-05-20 DIAGNOSIS — Z833 Family history of diabetes mellitus: Secondary | ICD-10-CM | POA: Diagnosis not present

## 2014-05-20 DIAGNOSIS — Z96643 Presence of artificial hip joint, bilateral: Secondary | ICD-10-CM | POA: Diagnosis present

## 2014-05-20 DIAGNOSIS — I129 Hypertensive chronic kidney disease with stage 1 through stage 4 chronic kidney disease, or unspecified chronic kidney disease: Secondary | ICD-10-CM | POA: Diagnosis present

## 2014-05-20 DIAGNOSIS — I25110 Atherosclerotic heart disease of native coronary artery with unstable angina pectoris: Secondary | ICD-10-CM | POA: Diagnosis present

## 2014-05-20 DIAGNOSIS — D696 Thrombocytopenia, unspecified: Secondary | ICD-10-CM | POA: Diagnosis present

## 2014-05-20 DIAGNOSIS — Z79899 Other long term (current) drug therapy: Secondary | ICD-10-CM | POA: Diagnosis not present

## 2014-05-20 DIAGNOSIS — Z7982 Long term (current) use of aspirin: Secondary | ICD-10-CM | POA: Diagnosis not present

## 2014-05-20 DIAGNOSIS — I200 Unstable angina: Secondary | ICD-10-CM

## 2014-05-20 DIAGNOSIS — I10 Essential (primary) hypertension: Secondary | ICD-10-CM | POA: Diagnosis present

## 2014-05-20 DIAGNOSIS — Z7901 Long term (current) use of anticoagulants: Secondary | ICD-10-CM | POA: Diagnosis not present

## 2014-05-20 DIAGNOSIS — Z87891 Personal history of nicotine dependence: Secondary | ICD-10-CM | POA: Diagnosis not present

## 2014-05-20 DIAGNOSIS — I213 ST elevation (STEMI) myocardial infarction of unspecified site: Secondary | ICD-10-CM | POA: Diagnosis not present

## 2014-05-20 DIAGNOSIS — R079 Chest pain, unspecified: Secondary | ICD-10-CM | POA: Diagnosis present

## 2014-05-20 HISTORY — PX: LEFT HEART CATHETERIZATION WITH CORONARY ANGIOGRAM: SHX5451

## 2014-05-20 HISTORY — DX: Unspecified osteoarthritis, unspecified site: M19.90

## 2014-05-20 HISTORY — PX: CORONARY ANGIOPLASTY WITH STENT PLACEMENT: SHX49

## 2014-05-20 HISTORY — DX: Gastro-esophageal reflux disease without esophagitis: K21.9

## 2014-05-20 LAB — I-STAT CHEM 8, ED
BUN: 14 mg/dL (ref 6–23)
CREATININE: 1.5 mg/dL — AB (ref 0.50–1.35)
Calcium, Ion: 1.21 mmol/L (ref 1.13–1.30)
Chloride: 99 mmol/L (ref 96–112)
Glucose, Bld: 104 mg/dL — ABNORMAL HIGH (ref 70–99)
HCT: 47 % (ref 39.0–52.0)
HEMOGLOBIN: 16 g/dL (ref 13.0–17.0)
POTASSIUM: 3.8 mmol/L (ref 3.5–5.1)
Sodium: 140 mmol/L (ref 135–145)
TCO2: 25 mmol/L (ref 0–100)

## 2014-05-20 LAB — APTT: aPTT: 25 seconds (ref 24–37)

## 2014-05-20 LAB — TROPONIN I: Troponin I: <0.03 ng/mL (ref <0.031)

## 2014-05-20 LAB — CBC WITH DIFFERENTIAL/PLATELET
BASOS ABS: 0 10*3/uL (ref 0.0–0.1)
BASOS PCT: 1 % (ref 0–1)
EOS ABS: 0.1 10*3/uL (ref 0.0–0.7)
Eosinophils Relative: 2 % (ref 0–5)
HCT: 45.6 % (ref 39.0–52.0)
HEMOGLOBIN: 15.5 g/dL (ref 13.0–17.0)
Lymphocytes Relative: 42 % (ref 12–46)
Lymphs Abs: 2.2 10*3/uL (ref 0.7–4.0)
MCH: 30.8 pg (ref 26.0–34.0)
MCHC: 34 g/dL (ref 30.0–36.0)
MCV: 90.5 fL (ref 78.0–100.0)
MONO ABS: 0.5 10*3/uL (ref 0.1–1.0)
Monocytes Relative: 9 % (ref 3–12)
Neutro Abs: 2.4 10*3/uL (ref 1.7–7.7)
Neutrophils Relative %: 46 % (ref 43–77)
PLATELETS: 112 10*3/uL — AB (ref 150–400)
RBC: 5.04 MIL/uL (ref 4.22–5.81)
RDW: 13.5 % (ref 11.5–15.5)
WBC: 5.2 10*3/uL (ref 4.0–10.5)

## 2014-05-20 LAB — COMPREHENSIVE METABOLIC PANEL
ALBUMIN: 3.9 g/dL (ref 3.5–5.2)
ALK PHOS: 51 U/L (ref 39–117)
ALT: 20 U/L (ref 0–53)
ANION GAP: 8 (ref 5–15)
AST: 27 U/L (ref 0–37)
BILIRUBIN TOTAL: 0.8 mg/dL (ref 0.3–1.2)
BUN: 15 mg/dL (ref 6–23)
CALCIUM: 9.2 mg/dL (ref 8.4–10.5)
CHLORIDE: 102 mmol/L (ref 96–112)
CO2: 29 mmol/L (ref 19–32)
Creatinine, Ser: 1.55 mg/dL — ABNORMAL HIGH (ref 0.50–1.35)
GFR calc non Af Amer: 45 mL/min — ABNORMAL LOW (ref >90)
GFR, EST AFRICAN AMERICAN: 52 mL/min — AB (ref >90)
Glucose, Bld: 106 mg/dL — ABNORMAL HIGH (ref 70–99)
Potassium: 3.8 mmol/L (ref 3.5–5.1)
SODIUM: 139 mmol/L (ref 135–145)
Total Protein: 6.8 g/dL (ref 6.0–8.3)

## 2014-05-20 LAB — I-STAT TROPONIN, ED: Troponin i, poc: 0 ng/mL (ref 0.00–0.08)

## 2014-05-20 LAB — PROTIME-INR
INR: 0.96 (ref 0.00–1.49)
Prothrombin Time: 12.9 seconds (ref 11.6–15.2)

## 2014-05-20 LAB — POCT ACTIVATED CLOTTING TIME: Activated Clotting Time: 435 seconds

## 2014-05-20 SURGERY — LEFT HEART CATHETERIZATION WITH CORONARY ANGIOGRAM

## 2014-05-20 MED ORDER — OXYCODONE-ACETAMINOPHEN 5-325 MG PO TABS: 1.0000 | ORAL_TABLET | ORAL | Status: DC | PRN

## 2014-05-20 MED ORDER — METOPROLOL TARTRATE 25 MG PO TABS
25.0000 mg | ORAL_TABLET | Freq: Two times a day (BID) | ORAL | Status: DC
  Administered 2014-05-20: 21:00:00 25 mg via ORAL
  Filled 2014-05-20: qty 1

## 2014-05-20 MED ORDER — HEPARIN SODIUM (PORCINE) 5000 UNIT/ML IJ SOLN
4000.0000 [IU] | INTRAMUSCULAR | Status: AC
  Administered 2014-05-20: 4000 [IU] via INTRAVENOUS
  Filled 2014-05-20: qty 1

## 2014-05-20 MED ORDER — ASPIRIN 81 MG PO CHEW
324.0000 mg | CHEWABLE_TABLET | Freq: Once | ORAL | Status: AC
Start: 2014-05-20 — End: 2014-05-20
  Filled 2014-05-20: qty 4

## 2014-05-20 MED ORDER — TICAGRELOR 90 MG PO TABS
90.0000 mg | ORAL_TABLET | Freq: Two times a day (BID) | ORAL | Status: DC
  Administered 2014-05-20: 90 mg via ORAL
  Filled 2014-05-20 (×3): qty 1

## 2014-05-20 MED ORDER — ACETAMINOPHEN 325 MG PO TABS: 650.0000 mg | ORAL_TABLET | ORAL | Status: DC | PRN

## 2014-05-20 MED ORDER — MIDAZOLAM HCL 2 MG/2ML IJ SOLN
INTRAMUSCULAR | Status: AC
  Filled 2014-05-20: qty 2

## 2014-05-20 MED ORDER — SODIUM CHLORIDE 0.9 % IV SOLN
INTRAVENOUS | Status: DC
  Administered 2014-05-20: 09:00:00 via INTRAVENOUS

## 2014-05-20 MED ORDER — LISINOPRIL 20 MG PO TABS
20.0000 mg | ORAL_TABLET | Freq: Every day | ORAL | Status: DC
  Filled 2014-05-20: qty 1

## 2014-05-20 MED ORDER — FENTANYL CITRATE 0.05 MG/ML IJ SOLN
INTRAMUSCULAR | Status: AC
  Filled 2014-05-20: qty 2

## 2014-05-20 MED ORDER — MORPHINE SULFATE 2 MG/ML IJ SOLN: 2.0000 mg | INTRAMUSCULAR | Status: DC | PRN

## 2014-05-20 MED ORDER — NITROGLYCERIN 1 MG/10 ML FOR IR/CATH LAB
INTRA_ARTERIAL | Status: AC
  Filled 2014-05-20: qty 10

## 2014-05-20 MED ORDER — SODIUM CHLORIDE 0.9 % IV SOLN: INTRAVENOUS | Status: AC

## 2014-05-20 MED ORDER — PANTOPRAZOLE SODIUM 40 MG PO TBEC: 40.0000 mg | DELAYED_RELEASE_TABLET | Freq: Every day | ORAL | Status: DC

## 2014-05-20 MED ORDER — ZOLPIDEM TARTRATE 5 MG PO TABS
5.0000 mg | ORAL_TABLET | Freq: Every evening | ORAL | Status: DC | PRN
  Administered 2014-05-20: 21:00:00 5 mg via ORAL
  Filled 2014-05-20: qty 1

## 2014-05-20 MED ORDER — ASPIRIN EC 81 MG PO TBEC: 81.0000 mg | DELAYED_RELEASE_TABLET | Freq: Every day | ORAL | Status: DC

## 2014-05-20 MED ORDER — ADENOSINE 12 MG/4ML IV SOLN
12.0000 mL | Freq: Once | INTRAVENOUS | Status: DC
  Filled 2014-05-20: qty 12

## 2014-05-20 MED ORDER — LIDOCAINE HCL (PF) 1 % IJ SOLN
INTRAMUSCULAR | Status: AC
  Filled 2014-05-20: qty 30

## 2014-05-20 MED ORDER — ONDANSETRON HCL 4 MG/2ML IJ SOLN: 4.0000 mg | Freq: Four times a day (QID) | INTRAMUSCULAR | Status: DC | PRN

## 2014-05-20 MED ORDER — HYDROCHLOROTHIAZIDE 12.5 MG PO CAPS
12.5000 mg | ORAL_CAPSULE | Freq: Every day | ORAL | Status: DC
  Filled 2014-05-20: qty 1

## 2014-05-20 MED ORDER — HEPARIN (PORCINE) IN NACL 2-0.9 UNIT/ML-% IJ SOLN
INTRAMUSCULAR | Status: AC
  Filled 2014-05-20: qty 1000

## 2014-05-20 MED ORDER — NITROGLYCERIN 0.4 MG SL SUBL: 0.4000 mg | SUBLINGUAL_TABLET | SUBLINGUAL | Status: DC | PRN

## 2014-05-20 MED ORDER — VERAPAMIL HCL 2.5 MG/ML IV SOLN
INTRAVENOUS | Status: AC
  Filled 2014-05-20: qty 2

## 2014-05-20 MED ORDER — ASPIRIN 81 MG PO CHEW
324.0000 mg | CHEWABLE_TABLET | Freq: Once | ORAL | Status: AC
  Administered 2014-05-20: 324 mg via ORAL
  Filled 2014-05-20: qty 4

## 2014-05-20 MED ORDER — ATORVASTATIN CALCIUM 40 MG PO TABS
40.0000 mg | ORAL_TABLET | Freq: Every evening | ORAL | Status: DC
  Administered 2014-05-20: 40 mg via ORAL
  Filled 2014-05-20 (×2): qty 1

## 2014-05-20 MED ORDER — HEPARIN SODIUM (PORCINE) 1000 UNIT/ML IJ SOLN
INTRAMUSCULAR | Status: AC
  Filled 2014-05-20: qty 1

## 2014-05-20 NOTE — ED Provider Notes (Signed)
CSN: [id number]     Arrival date & time 05/20/14  0802 History   First MD Initiated Contact with Patient 05/20/14 [phone]     Chief Complaint  Patient presents with  . Chest Pain     (Consider location/radiation/quality/duration/timing/severity/associated sxs/prior Treatment) HPI Comments: Patient is a 69 year old male who presents to the emergency department with a complaint of chest pain. The patient states that he was sitting up earlier this morning when he had a dull pain in the mid anterior chest that lasted about 3-4 minutes. It was associated with nausea for short period of time. This was different than "twinges" that he had been having in the past while coughing, so he came to the emergency department for evaluation. The patient denies shortness of breath, loss of consciousness, sweats, back pain, jaw pain or actual vomiting. According to his family there was no mental status changes.  It is of note that the patient sustained a myocardial infarction in March 2015 at which time he required stents in the right coronary artery, as well as stents in the obtuse marginal. He has residual disease in the left anterior descending and diagonal branches, that was being treated medically. The patient states he has nitroglycerin, but did not use the nitroglycerin not this morning. He states that he took his medications which included an 81 mg aspirin, vitamin D3, hydrochlorothiazide, lisinopril, Lopressor, andBrilinta. He states now that he is pain-free and nausea free.  Patient is a 69 y.o. male presenting with chest pain. The history is provided by the patient.  Chest Pain Pain location:  Substernal area Pain quality: dull   Pain radiates to:  Does not radiate Pain radiates to the back: no   Pain severity:  No pain Onset quality:  Sudden Duration:  2 minutes Progression:  Partially resolved Context: at rest   Associated symptoms: nausea   Associated symptoms: no abdominal pain, no back pain, no  cough, no fatigue, no palpitations and no shortness of breath     Past Medical History  Diagnosis Date  . Essential hypertension, benign   . Thrombocytopenia   . CKD (chronic kidney disease), stage II   . Coronary atherosclerosis of native coronary artery     DES x 2 to RCA on 05/19/13, staged DES to OM 05/22/13, residual LAD/diag disease for medical management  . Inferior myocardial infarction     March 2015   Past Surgical History  Procedure Laterality Date  . Bilateral hip replacements    . Cardiac catheterization    . Coronary stent placement  05/19/13  . Left heart catheterization with coronary angiogram N/A 05/19/2013    Procedure: LEFT HEART CATHETERIZATION WITH CORONARY ANGIOGRAM;  Surgeon: Lennette Bihari, MD;  Location: [facility] CATH LAB;  Service: Cardiovascular;  Laterality: N/A;  . Percutaneous coronary stent intervention (pci-s) N/A 05/22/2013    Procedure: PERCUTANEOUS CORONARY STENT INTERVENTION (PCI-S);  Surgeon: Kathleene Hazel, MD;  Location: [facility] CATH LAB;  Service: Cardiovascular;  Laterality: N/A;  . Fractional flow reserve wire Right 05/22/2013    Procedure: FRACTIONAL FLOW RESERVE WIRE;  Surgeon: Kathleene Hazel, MD;  Location: [facility] CATH LAB;  Service: Cardiovascular;  Laterality: Right;   Family History  Problem Relation Age of Onset  . Aneurysm Mother   . Heart disease Sister   . Diabetes Sister   . Diabetes Sister    History  Substance Use Topics  . Smoking status: Former Smoker -- 1.00 packs/day for 38 years    Types: Cigarettes  Start date: 10/16/1965    Quit date: 02/21/2002  . Smokeless tobacco: Never Used  . Alcohol Use: 0.6 oz/week    1 Glasses of wine per week    Review of Systems  Constitutional: Negative for fatigue.  Respiratory: Negative for cough, shortness of breath and wheezing.   Cardiovascular: Positive for chest pain. Negative for palpitations and leg swelling.  Gastrointestinal: Positive for nausea. Negative for abdominal pain.   Musculoskeletal: Negative for back pain.      Allergies  Review of patient's allergies indicates no known allergies.  Home Medications   Prior to Admission medications   Medication Sig Start Date End Date Taking? Authorizing Provider  aspirin EC 81 MG tablet Take 81 mg by mouth daily.    Historical Provider, MD  atorvastatin (LIPITOR) 40 MG tablet Take 1 tablet (40 mg total) by mouth every evening. 05/23/13   Dayna N Dunn, PA-C  BRILINTA 90 MG TABS tablet TAKE 1 TABLET TWICE A DAY 05/09/14   Jonelle Sidle, MD  Cholecalciferol (VITAMIN D-3) 1000 UNITS CAPS Take 1,000 Units by mouth daily.     Historical Provider, MD  hydrochlorothiazide (MICROZIDE) 12.5 MG capsule Take 1 capsule (12.5 mg total) by mouth daily. 07/09/13   Jodelle Gross, NP  lisinopril (PRINIVIL,ZESTRIL) 20 MG tablet Take 1 tablet (20 mg total) by mouth daily. 07/17/13   Jonelle Sidle, MD  metoprolol tartrate (LOPRESSOR) 25 MG tablet Take 1 tablet (25 mg total) by mouth 2 (two) times daily. 05/23/13   Dayna N Dunn, PA-C  nitroGLYCERIN (NITROSTAT) 0.4 MG SL tablet Place 1 tablet (0.4 mg total) under the tongue every 5 (five) minutes as needed for chest pain (up to 3 doses). 05/23/13   Dayna N Dunn, PA-C  omeprazole (PRILOSEC) 20 MG capsule Take 20 mg by mouth 2 (two) times daily before a meal.    Historical Provider, MD  vitamin B-12 (CYANOCOBALAMIN) 1000 MCG tablet Take 1,000 mcg by mouth daily.    Historical Provider, MD   BP 157/97 mmHg  Pulse 70  Temp(Src) 97.5 F (36.4 C) (Oral)  Resp 18  Ht  (1.651 m)  Wt 158 lb (71.668 kg)  BMI 26.29 kg/m2  SpO2 100% Physical Exam  Constitutional: He is oriented to person, place, and time. He appears well-developed and well-nourished.  Non-toxic appearance.  HENT:  Head: Normocephalic.  Right Ear: Tympanic membrane and external ear normal.  Left Ear: Tympanic membrane and external ear normal.  Eyes: EOM and lids are normal. Pupils are equal, round, and reactive to  light.  Neck: Normal range of motion. Neck supple. Carotid bruit is not present.  Cardiovascular: Normal rate, regular rhythm, normal heart sounds, intact distal pulses and normal pulses.   No rub, gallop, or significant murmur appreciated.  Pulmonary/Chest: Breath sounds normal. No respiratory distress.  Abdominal: Soft. Bowel sounds are normal. There is no tenderness. There is no guarding.  Musculoskeletal: Normal range of motion. He exhibits no edema or tenderness.  No edema. Negative Homans sign.  Lymphadenopathy:       Head (right side): No submandibular adenopathy present.       Head (left side): No submandibular adenopathy present.    He has no cervical adenopathy.  Neurological: He is alert and oriented to person, place, and time. He has normal strength. No cranial nerve deficit or sensory deficit.  Skin: Skin is warm and dry.  Psychiatric: He has a normal mood and affect. His speech is normal.  Nursing note  and vitals reviewed.   ED Course  Patient seen with me by Dr. Manus Gunning. Case discussed with Dr. Teresita Madura by Dr. Consuella Lose   I have discussed EKG findings and current plan with patient and his wife in terms which they understand.   Procedures (including critical care time) Labs Review Labs Reviewed  CBC WITH DIFFERENTIAL/PLATELET  COMPREHENSIVE METABOLIC PANEL  TROPONIN I  APTT  PROTIME-INR  I-STAT TROPOININ, ED    Imaging Review No results found.   EKG Interpretation None      MDM  No leg swelling, recent surgery, or hemoptysis, or tachypnea or tachycardia. Doubt pulmonary embolus.  Electrolytes are within normal limits. The creatinine is 1.50 which is slightly elevated. The electrocardiogram shows some ST elevation in sleep to 3 and aVF as well as V5 and V6.  This was discussed with cardiology. Given the patient's history of coronary artery disease, as well as history of hypertension it was the opinion that the patient should be transferred to the [facility] for additional evaluation and possible invasive cardiology care. This is been discussed with the patient and he is in agreement with this plan. Code STEMI called . Pt accepted by Dr Diona Browner.   Final diagnoses:  None    **I have reviewed nursing notes, vital signs, and all appropriate lab and imaging results for this patient.Ivery Quale, PA-C 05/20/14 1610  Glynn Octave, MD 05/20/14 [phone]

## 2014-05-20 NOTE — ED Provider Notes (Signed)
Medical screening examination/treatment/procedure(s) were conducted as a shared visit with non-physician practitioner(s) and myself.  I personally evaluated the patient during the encounter.  Central chest pain onset at rest this morning lasting several minutes now resolved. Persistent nausea. Pain did not radiate. No shortness of breath, diaphoresis. Pain similar to previous MI in 2015. CAD with stents in STEMI in 2015.   No distress. Regular rate and rhythm. Lungs clear to auscultation. Equal radial, femoral, DP and PT pulses  EKG shows ST elevation inferior lateral leads. Patient remains pain-free. Discussed with Dr. Clifton James who agrees with calling code STEMI despite patient being pain-free.  Patient given aspirin, heparin.  CRITICAL CARE Performed by: Glynn Octave Total critical care time: 30 Critical care time was exclusive of separately billable procedures and treating other patients. Critical care was necessary to treat or prevent imminent or life-threatening deterioration. Critical care was time spent personally by me on the following activities: development of treatment plan with patient and/or surrogate as well as nursing, discussions with consultants, evaluation of patient's response to treatment, examination of patient, obtaining history from patient or surrogate, ordering and performing treatments and interventions, ordering and review of laboratory studies, ordering and review of radiographic studies, pulse oximetry and re-evaluation of patient's condition.    EKG Interpretation   Date/Time:  Tuesday May 20 2014 08:19:53 EDT Ventricular Rate:  75 PR Interval:  163 QRS Duration: 99 QT Interval:  403 QTC Calculation: 450 R Axis:   79 Text Interpretation:  Sinus rhythm ST elevation, consider inferior injury  inferior lateral ST elevation Confirmed by Manus Gunning  MD, Ritta Hammes ([id number]) on  05/20/2014 8:37:20 AM        Glynn Octave, MD 05/20/14 [phone]

## 2014-05-20 NOTE — Progress Notes (Signed)
TR BAND REMOVAL  LOCATION:    right radial  DEFLATED PER PROTOCOL:    Yes.    TIME BAND OFF / DRESSING APPLIED:    1500   SITE UPON ARRIVAL:    Level 0  SITE AFTER BAND REMOVAL:    Level 0  CIRCULATION SENSATION AND MOVEMENT:    Within Normal Limits   Yes.    COMMENTS:   Rechecked at 1530 with no change in assessment  

## 2014-05-20 NOTE — ED Notes (Signed)
Patient c/o mid-sternal, non-radiating chest pain that started this morning. Per patient nausea and vomiting. Patient has cardiac hx with stents placed. Patient reports taking 81mg  aspirin this morning.

## 2014-05-20 NOTE — ED Notes (Signed)
In route to [facility] Lab.

## 2014-05-20 NOTE — CV Procedure (Signed)
Cardiac Catheterization Operative Report  Keith Keith [id number] 3/29/201610:26 AM Kirstie Peri, MD  Procedure Performed:  1. Left Heart Catheterization 2. Selective Coronary Angiography 3. Fractional flow reserve LAD 4. PTCA/DES x 1 mid LAD  Operator: Verne Carrow, MD  Arterial access site:  Right radial artery.   Indication:   69 yo male with history of CAD with inferior STEMI in March 2015 with DES x 2 RCA at that time, staged PCI of the OM in April 2015 presented today with chest pain and subtle ST elevations inferior leads. [facility] but not considered a STEMI.  (Urgent for unstable angina).                                Procedure Details: The risks, benefits, complications, treatment options, and expected outcomes were discussed with the patient. Emergency consent obtained. The patient was brought to the cath lab after IV hydration was begun and oral premedication was given. The patient was further sedated with Versed and Fentanyl. The right wrist was assessed with a modified Allens test which was positive. The right wrist was prepped and draped in a sterile fashion. 1% lidocaine was used for local anesthesia. Using the modified Seldinger access technique, a 5 French sheath was placed in the right radial artery. 3 mg Verapamil was given through the sheath. 2000 units IV heparin was given (4000 units had been given prior to transfer). Standard diagnostic catheters were used to perform selective coronary angiography. LV pressures measured with the JR4 catheter. No left ventricular angiogram was performed. He was found to have stenosis in the mid LAD.This appeared to be significant in several views. I elected to proceed to fractional flow reserve assessment of the LAD (FFR).   PCI Note: The patient was given an additional 5000 units IV heparin. ACT was over 400. I then engaged the left main with a XB LAD 3.5 guiding catheter. I then passed a  Cougar IC wire down the LAD. The Assist FFR device was passed down the LAD. Baseline FFR was 0.93. With infusion of IV adenosine, the FFR fell to 0.78 suggesting the flow in the LAD was reduced secondary to the stenosis.   I then passed a 2.5 x 12 mm balloon into the mid LAD and performed one inflation. A 3.0 x 26 mm Resolute Integrity DES was deployed in the mid LAD. The stent was post-dilated with a 3.25 x 15 mm [place] balloon x 1. The stenosis was taken from 80% down to 0%. The sheath was removed from the right radial artery and a Terumo hemostasis band was applied at the arteriotomy site on the right wrist.   There were no immediate complications. The patient was taken to the recovery area in stable condition.   Hemodynamic Findings: Central aortic pressure: 127/69 Left ventricular pressure: 130/6/12  Angiographic Findings:  Left main: No obstructive disease.   Left Anterior Descending Artery: Large caliber vessel that courses to the apex. The proximal vessel has 20% stenosis. The mid vessel has a 80% stenosis just beyond the diagonal branch. The distal vessel has a segment of bridging but no focally obstructive plaque. The diagonal branch is a small caliber vessel with ostial 80% stenosis and diffuse 80% stenosis throughout the vessel.   Circumflex Artery: Large caliber vessel with 20% proximal stenosis. Patent first obtuse marginal stent with no restenosis. There is a very small caliber  sub-branch of OM1 with 99% stenosis, too small for PCI and unchanged from last cath.   Right Coronary Artery: Moderate caliber dominant vessel with ostial 20% stenosis, diffuse 20% proximal and mid stenosis. The stents in the mid and distal vessel are patent with minimal restenosis. The PDA and PLA are very small in caliber with diffuse plaque disease.   Left Ventricular Angiogram: Deferred.   Impression: 1. Triple vessel CAD with patent stents RCA and Circumflex, severe stenosis mid LAD 2. Successful  PTCA/DES x 1 mid LAD  Recommendations: Will continue ASA and Brilinta (no loading dose of Brilinta given as he took his am dose today). Continue other cardiac meds. Echo later today. Discharge tomorrow if stable.        Complications:  None. The patient tolerated the procedure well.

## 2014-05-20 NOTE — ED Notes (Addendum)
[facility] at bedside for transport to [facility] cath lab.

## 2014-05-20 NOTE — ED Notes (Signed)
Patient belongings given to Brother-in-law, Augustin Schooling.

## 2014-05-20 NOTE — H&P (Signed)
Patient ID: Keith Keith. MRN: [id number] DOB/AGE: 06/26/1945 69 y.o. Admit date: 05/20/2014  Primary Cardiologist: Diona Browner  HPI: 69 yo male with history of HTN, CAD with inferior STEMI in March 2015 at which time his RCA had diffuse severe mid and distal disease treated with overlapping DES in the RCA and staged PCI of the OM 2 days later with DES placed OM. He has done well over the last year and is very active, playing golf several days per week. He woke up this am with severe chest pain around 6am. Presented to the [facility] ED with c/o pain and nausea. Chest pain resolved in the ED. EKG with 0.5-1 mm ST elevation inferior leads. Code STEMI called and pt transported emergently to [facility] for cardiac cath.   No pain at time of arrival to cath lab.    Review of systems complete and found to be negative unless listed above   Past Medical History  Diagnosis Date  . Essential hypertension, benign   . Thrombocytopenia   . CKD (chronic kidney disease), stage II   . Coronary atherosclerosis of native coronary artery     DES x 2 to RCA on 05/19/13, staged DES to OM 05/22/13, residual LAD/diag disease for medical management  . Inferior myocardial infarction     March 2015    Family History  Problem Relation Age of Onset  . Aneurysm Mother   . Heart disease Sister   . Diabetes Sister   . Diabetes Sister     History   Social History  . Marital Status: Married    Spouse Name: N/A  . Number of Children: N/A  . Years of Education: N/A   Occupational History  . Not on file.   Social History Main Topics  . Smoking status: Former Smoker -- 1.00 packs/day for 38 years    Types: Cigarettes    Start date: 10/16/1965    Quit date: 02/21/2002  . Smokeless tobacco: Never Used  . Alcohol Use: 0.6 oz/week    1 Glasses of wine per week  . Drug Use: No  . Sexual Activity: Not on file   Other Topics Concern  . Not on file   Social History Narrative    Past Surgical History    Procedure Laterality Date  . Bilateral hip replacements    . Cardiac catheterization    . Coronary stent placement  05/19/13  . Left heart catheterization with coronary angiogram N/A 05/19/2013    Procedure: LEFT HEART CATHETERIZATION WITH CORONARY ANGIOGRAM;  Surgeon: Lennette Bihari, MD;  Location: [facility] CATH LAB;  Service: Cardiovascular;  Laterality: N/A;  . Percutaneous coronary stent intervention (pci-s) N/A 05/22/2013    Procedure: PERCUTANEOUS CORONARY STENT INTERVENTION (PCI-S);  Surgeon: Kathleene Hazel, MD;  Location: [facility] CATH LAB;  Service: Cardiovascular;  Laterality: N/A;  . Fractional flow reserve wire Right 05/22/2013    Procedure: FRACTIONAL FLOW RESERVE WIRE;  Surgeon: Kathleene Hazel, MD;  Location: [facility] CATH LAB;  Service: Cardiovascular;  Laterality: Right;    No Known Allergies  Prior to Admission Meds:  Prior to Admission medications   Medication Sig Start Date End Date Taking? Authorizing Provider  aspirin EC 81 MG tablet Take 81 mg by mouth daily.    Historical Provider, MD  atorvastatin (LIPITOR) 40 MG tablet Take 1 tablet (40 mg total) by mouth every evening. 05/23/13   Dayna N Dunn, PA-C  BRILINTA 90 MG TABS tablet TAKE 1 TABLET TWICE  A DAY 05/09/14   Jonelle Sidle, MD  Cholecalciferol (VITAMIN D-3) 1000 UNITS CAPS Take 1,000 Units by mouth daily.     Historical Provider, MD  hydrochlorothiazide (MICROZIDE) 12.5 MG capsule Take 1 capsule (12.5 mg total) by mouth daily. 07/09/13   Jodelle Gross, NP  lisinopril (PRINIVIL,ZESTRIL) 20 MG tablet Take 1 tablet (20 mg total) by mouth daily. 07/17/13   Jonelle Sidle, MD  metoprolol tartrate (LOPRESSOR) 25 MG tablet Take 1 tablet (25 mg total) by mouth 2 (two) times daily. 05/23/13   Dayna N Dunn, PA-C  nitroGLYCERIN (NITROSTAT) 0.4 MG SL tablet Place 1 tablet (0.4 mg total) under the tongue every 5 (five) minutes as needed for chest pain (up to 3 doses). 05/23/13   Dayna N Dunn, PA-C  omeprazole (PRILOSEC) 20 MG  capsule Take 20 mg by mouth 2 (two) times daily before a meal.    Historical Provider, MD  vitamin B-12 (CYANOCOBALAMIN) 1000 MCG tablet Take 1,000 mcg by mouth daily.    Historical Provider, MD    Physical Exam: Blood pressure 167/87, pulse 64, temperature 97.5 F (36.4 C), temperature source Oral, resp. rate 16, height  (1.651 m), weight 158 lb (71.668 kg), SpO2 100 %.    General: Well developed, well nourished, NAD  HEENT: OP clear, mucus membranes moist  SKIN: warm, dry. No rashes.  Neuro: No focal deficits  Musculoskeletal: Muscle strength 5/5 all ext  Psychiatric: Mood and affect normal  Neck: No JVD, no carotid bruits, no thyromegaly, no lymphadenopathy.  Lungs:Clear bilaterally, no wheezes, rhonci, crackles  Cardiovascular: Regular rate and rhythm. No murmurs, gallops or rubs.  Abdomen:Soft. Bowel sounds present. Non-tender.  Extremities: No lower extremity edema. Pulses are 2 + in the bilateral DP/PT.  Labs:   Lab Results  Component Value Date   WBC 5.2 05/20/2014   HGB 16.0 05/20/2014   HCT 47.0 05/20/2014   MCV 90.5 05/20/2014   PLT 112* 05/20/2014    Recent Labs Lab 05/20/14 0828 05/20/14 0835  NA 139 140  K 3.8 3.8  CL 102 99  CO2 29  --   BUN 15 14  CREATININE 1.55* 1.50*  CALCIUM 9.2  --   PROT 6.8  --   BILITOT 0.8  --   ALKPHOS 51  --   ALT 20  --   AST 27  --   GLUCOSE 106* 104*   Lab Results  Component Value Date   CKTOTAL 184 05/19/2013   CKMB 3.2 05/19/2013   TROPONINI <0.03 05/20/2014      EKG: Sinus, rate 75 bpm. 0.5-26mm ST elevation inferior leads and V5, V6.   ASSESSMENT AND PLAN:   1. CAD/Inferior ST elevation: His EKG shows subtle ST elevation in the inferior leads which is slightly different than his baseline EKG. He is now pain free but given presentation, history of multiple stents and abnormal EKG, will plan urgent cath to define his CAD.   Earney Hamburg, MD 05/20/2014, 9:36 AM

## 2014-05-20 NOTE — ED Notes (Signed)
Report called to Cath lab. Report given to Lewis Moccasin, RN

## 2014-05-20 NOTE — ED Notes (Signed)
[facility] in for transfer to [facility].

## 2014-05-20 NOTE — Progress Notes (Signed)
  Echocardiogram 2D Echocardiogram has been performed.  Aris Everts 05/20/2014, 4:43 PM

## 2014-05-21 ENCOUNTER — Encounter (HOSPITAL_COMMUNITY): Payer: Self-pay | Admitting: Cardiovascular Disease

## 2014-05-21 DIAGNOSIS — I2510 Atherosclerotic heart disease of native coronary artery without angina pectoris: Secondary | ICD-10-CM | POA: Diagnosis present

## 2014-05-21 DIAGNOSIS — K219 Gastro-esophageal reflux disease without esophagitis: Secondary | ICD-10-CM | POA: Diagnosis present

## 2014-05-21 LAB — CBC
HCT: 41.8 % (ref 39.0–52.0)
HEMOGLOBIN: 13.8 g/dL (ref 13.0–17.0)
MCH: 29.8 pg (ref 26.0–34.0)
MCHC: 33 g/dL (ref 30.0–36.0)
MCV: 90.3 fL (ref 78.0–100.0)
Platelets: 100 10*3/uL — ABNORMAL LOW (ref 150–400)
RBC: 4.63 MIL/uL (ref 4.22–5.81)
RDW: 13.7 % (ref 11.5–15.5)
WBC: 4.8 10*3/uL (ref 4.0–10.5)

## 2014-05-21 LAB — BASIC METABOLIC PANEL
Anion gap: 6 (ref 5–15)
BUN: 12 mg/dL (ref 6–23)
CALCIUM: 9 mg/dL (ref 8.4–10.5)
CO2: 28 mmol/L (ref 19–32)
Chloride: 105 mmol/L (ref 96–112)
Creatinine, Ser: 1.57 mg/dL — ABNORMAL HIGH (ref 0.50–1.35)
GFR calc Af Amer: 51 mL/min — ABNORMAL LOW (ref >90)
GFR calc non Af Amer: 44 mL/min — ABNORMAL LOW (ref >90)
GLUCOSE: 104 mg/dL — AB (ref 70–99)
Potassium: 4.2 mmol/L (ref 3.5–5.1)
Sodium: 139 mmol/L (ref 135–145)

## 2014-05-21 NOTE — Discharge Instructions (Signed)

## 2014-05-21 NOTE — Progress Notes (Signed)
SUBJECTIVE: No chest pain or SOB.   BP 99/48 mmHg  Pulse 70  Temp(Src) 98 F (36.7 C) (Oral)  Resp 14  Ht  (1.651 m)  Wt 158 lb 11.7 oz (72 kg)  BMI 26.41 kg/m2  SpO2 97%  Intake/Output Summary (Last 24 hours) at 05/21/14 0709 Last data filed at 05/20/14 2349  Gross per 24 hour  Intake    540 ml  Output    650 ml  Net   -110 ml    PHYSICAL EXAM General: Well developed, well nourished, in no acute distress. Alert and oriented x 3.  Psych:  Good affect, responds appropriately Neck: No JVD. No masses noted.  Lungs: Clear bilaterally with no wheezes or rhonci noted.  Heart: RRR with no murmurs noted. Abdomen: Bowel sounds are present. Soft, non-tender.  Extremities: No lower extremity edema.   LABS: Basic Metabolic Panel:  Recent Labs  16/10/96 0828 05/20/14 0835 05/21/14 0346  NA 139 140 139  K 3.8 3.8 4.2  CL 102 99 105  CO2 29  --  28  GLUCOSE 106* 104* 104*  BUN CREATININE 1.55* 1.50* 1.57*  CALCIUM 9.2  --  9.0   CBC:  Recent Labs  05/20/14 0828 05/20/14 0835 05/21/14 0346  WBC 5.2  --  4.8  NEUTROABS 2.4  --   --   HGB 15.5 16.0 13.8  HCT 45.6 47.0 41.8  MCV 90.5  --  90.3  PLT 112*  --  100*   Cardiac Enzymes:  Recent Labs  05/20/14 0828  TROPONINI <0.03   Current Meds: . aspirin EC  81 mg Oral Daily  . atorvastatin  40 mg Oral QPM  . hydrochlorothiazide  12.5 mg Oral Daily  . lisinopril  20 mg Oral Daily  . metoprolol tartrate  25 mg Oral BID  . pantoprazole  40 mg Oral Daily  . ticagrelor  90 mg Oral BID   Cardiac cath 05/20/14: Left main: No obstructive disease.  Left Anterior Descending Artery: Large caliber vessel that courses to the apex. The proximal vessel has 20% stenosis. The mid vessel has a 80% stenosis just beyond the diagonal branch. The distal vessel has a segment of bridging but no focally obstructive plaque. The diagonal branch is a small caliber vessel with ostial 80% stenosis and diffuse 80%  stenosis throughout the vessel.  Circumflex Artery: Large caliber vessel with 20% proximal stenosis. Patent first obtuse marginal stent with no restenosis. There is a very small caliber sub-branch of OM1 with 99% stenosis, too small for PCI and unchanged from last cath.  Right Coronary Artery: Moderate caliber dominant vessel with ostial 20% stenosis, diffuse 20% proximal and mid stenosis. The stents in the mid and distal vessel are patent with minimal restenosis. The PDA and PLA are very small in caliber with diffuse plaque disease.  Left Ventricular Angiogram: Deferred.   Echo 05/20/14: Left ventricle: The cavity size was normal. Wall thickness was increased in a pattern of moderate LVH. Systolic function was normal. The estimated ejection fraction was in the range of 60% to 65%. Wall motion was normal; there were no regional wall motion abnormalities. Doppler parameters are consistent with abnormal left ventricular relaxation (grade 1 diastolic dysfunction). The E/e&' ratio is between 8-15, suggesting indeterminate LV filling pressure. - Aortic valve: Sclerosis without stenosis. There was no regurgitation. - Mitral valve: There was no significant regurgitation. - Left atrium: The atrium was normal in size. - Right  ventricle: The cavity size was normal. Wall thickness was normal. Systolic function is reduced. - Right atrium: The atrium was normal in size. Impressions: - LVEF 60-65%, moderate LVH, diastolic dysfunction, indeterminate (but likely elevated LV Filling pressure), normal LA size, mildly reduced RV function by TAPSE, no significant TR  ASSESSMENT AND PLAN:  1. CAD/Unstable angina: Admitted yesterday with chest pain and EKG abnormalities. Cardiac cath with severe stenosis mid LAD treated with DES x 1. Echo with normal LV function. No valve issues. He is on ASA and Brilinta (has been at home). Continue beta blocker and statin. Continue other home meds.    2. Dispo: Discharge home today. Follow up in 1-2 weeks with Dr. Diona Browner or office APP in [place] office  Raiford Fetterman  3/30/20167:09 AM

## 2014-05-21 NOTE — Discharge Summary (Signed)
Discharge Summary   Patient ID: Keith Keith. MRN: [id number], DOB/AGE: December 27, 1945 69 y.o. Admit date: 05/20/2014 D/C date:     05/21/2014  Primary Cardiologist: Dr. Diona Browner  Principal Problem:   Unstable angina Active Problems:   Essential hypertension, benign   Thrombocytopenia   CKD (chronic kidney disease), stage II   GERD (gastroesophageal reflux disease)   CAD (coronary artery disease)    Admission Dates: 05/20/14-05/21/14 Discharge Diagnosis: Unstable angina s/p DESx1 to LAD   HPI: Keith Keith. is a 69 y.o. male with a history of CAD s/p DES x 2 to RCA and staged DES to OM (05/2013), HTN, CKD and GERD who was transferred from [facility] to [facility] on 05/20/14 with chest pain and taken back for urgent cardiac catheterization.  He has a history of inferior STEMI in March 2015 at which time his RCA had diffuse severe mid and distal disease treated with overlapping DES in the RCA and staged PCI of the OM 2 days later with DES placed OM. He had done well over the last year and is very active, playing golf several days per week. He woke up on the morning of admissoin with severe chest pain around 6am. He presented to the [facility] ED with c/o pain and nausea. Chest pain resolved in the ED. EKG with 0.5-1 mm ST elevation inferior leads. Code STEMI called and pt transported emergently to [facility] for cardiac cath. When he presented to [facility], his EKG showed subtle ST elevation in the inferior leads which was slightly different than his baseline EKG. CODE STEMI cancelled. He was then chest pain free but given presentation, history of multiple stents and abnormal EKG, it was decided to proceed with urgent cath to define his CAD.   Hospital Course  CAD/Unstable angina: Admitted 05/20/14 with chest pain and EKG abnormalities. Cardiac cath with severe stenosis mid LAD treated with DES x 1. Echo with normal LV function. No valve issues.  -- Continue ASA and Brilinta (has been at home), beta blocker and  statin. Continue other home meds.  CKD- creat stable at 1.57 today    Thrombocytopenia- PLT 100. This is chronic  HTN- BP well controlled on current regimen  The patient has had an uncomplicated hospital course and is recovering well. The radial catheter site is stable. He  has been seen by Dr. Sanjuana Kava today and deemed ready for discharge home. All follow-up appointments have been scheduled. Discharge medications are listed below.   Discharge Vitals: Blood pressure 114/70, pulse 63, temperature 98.1 F (36.7 C), temperature source Oral, resp. rate 18, height  (1.651 m), weight 158 lb 11.7 oz (72 kg), SpO2 99 %.  Labs: Lab Results  Component Value Date   WBC 4.8 05/21/2014   HGB 13.8 05/21/2014   HCT 41.8 05/21/2014   MCV 90.3 05/21/2014   PLT 100* 05/21/2014    Recent Labs Lab 05/20/14 0828  05/21/14 0346  NA 139  < > 139  K 3.8  < > 4.2  CL 102  < > 105  CO2 29  --  28  BUN 15  < > 12  CREATININE 1.55*  < > 1.57*  CALCIUM 9.2  --  9.0  PROT 6.8  --   --   BILITOT 0.8  --   --   ALKPHOS 51  --   --   ALT 20  --   --   AST 27  --   --  GLUCOSE 106*  < > 104*  < > = values in this interval not displayed.  Recent Labs  05/20/14 0828  TROPONINI <0.03   Lab Results  Component Value Date   CHOL 128 05/20/2013   HDL 42 05/20/2013   LDLCALC 68 05/20/2013   TRIG 92 05/20/2013     Diagnostic Studies/Procedures   Dg Chest Port 1 View  05/20/2014   CLINICAL DATA:  69 year old male with midsternal non radiating chest pain onset 0600 hr this morning. Nausea and vomiting. Initial encounter.  EXAM: PORTABLE CHEST - 1 VIEW  COMPARISON:  05/23/2013.  FINDINGS: Portable AP upright view at 0839 hrs. Mildly lower lung volumes. Stable mild eventration of the right hemidiaphragm. Normal cardiac size and mediastinal contours. Visualized tracheal air column is within normal limits. Stable mild blunting of the left costophrenic angle. No pneumothorax, pulmonary edema, pleural  effusion or acute pulmonary opacity.  IMPRESSION: No acute cardiopulmonary abnormality.   Electronically Signed   By: Odessa Fleming M.D.   On: 05/20/2014 08:56     Cardiac cath 05/20/14: Left main: No obstructive disease.  Left Anterior Descending Artery: Large caliber vessel that courses to the apex. The proximal vessel has 20% stenosis. The mid vessel has a 80% stenosis just beyond the diagonal branch. The distal vessel has a segment of bridging but no focally obstructive plaque. The diagonal branch is a small caliber vessel with ostial 80% stenosis and diffuse 80% stenosis throughout the vessel.  Circumflex Artery: Large caliber vessel with 20% proximal stenosis. Patent first obtuse marginal stent with no restenosis. There is a very small caliber sub-branch of OM1 with 99% stenosis, too small for PCI and unchanged from last cath.  Right Coronary Artery: Moderate caliber dominant vessel with ostial 20% stenosis, diffuse 20% proximal and mid stenosis. The stents in the mid and distal vessel are patent with minimal restenosis. The PDA and PLA are very small in caliber with diffuse plaque disease.  Left Ventricular Angiogram: Deferred.     Echo 05/20/14: Left ventricle: The cavity size was normal. Wall thickness was increased in a pattern of moderate LVH. Systolic function was normal. The estimated ejection fraction was in the range of 60% to 65%. Wall motion was normal; there were no regional wall motion abnormalities. Doppler parameters are consistent with abnormal left ventricular relaxation (grade 1 diastolic dysfunction). The E/e&' ratio is between 8-15, suggesting indeterminate LV filling pressure. - Aortic valve: Sclerosis without stenosis. There was no regurgitation. - Mitral valve: There was no significant regurgitation. - Left atrium: The atrium was normal in size. - Right ventricle: The cavity size was normal. Wall thickness was normal. Systolic function is  reduced. - Right atrium: The atrium was normal in size. Impressions: - LVEF 60-65%, moderate LVH, diastolic dysfunction, indeterminate (but likely elevated LV Filling pressure), normal LA size, mildly reduced RV function by TAPSE, no significant TR    Discharge Medications     Medication List    TAKE these medications        aspirin EC 81 MG tablet  Take 81 mg by mouth daily.     atorvastatin 40 MG tablet  Commonly known as:  LIPITOR  Take 1 tablet (40 mg total) by mouth every evening.     BRILINTA 90 MG Tabs tablet  Generic drug:  ticagrelor  TAKE 1 TABLET TWICE A DAY     hydrochlorothiazide 12.5 MG capsule  Commonly known as:  MICROZIDE  Take 1 capsule (12.5 mg total) by mouth daily.  lisinopril 20 MG tablet  Commonly known as:  PRINIVIL,ZESTRIL  Take 1 tablet (20 mg total) by mouth daily.     metoprolol tartrate 25 MG tablet  Commonly known as:  LOPRESSOR  Take 1 tablet (25 mg total) by mouth 2 (two) times daily.     nitroGLYCERIN 0.4 MG SL tablet  Commonly known as:  NITROSTAT  Place 1 tablet (0.4 mg total) under the tongue every 5 (five) minutes as needed for chest pain (up to 3 doses).     omeprazole 20 MG capsule  Commonly known as:  PRILOSEC  Take 20 mg by mouth 2 (two) times daily before a meal.     vitamin B-12 1000 MCG tablet  Commonly known as:  CYANOCOBALAMIN  Take 1,000 mcg by mouth daily.     Vitamin D-3 1000 UNITS Caps  Take 1,000 Units by mouth daily.        Disposition   The patient will be discharged in stable condition to home.  Follow-up Information    Follow up with Nona Dell, MD On 06/03/2014.   Specialty:  Cardiology   Why:  @ 1:15 pm   Contact information:   [address] [place] [address] [phone]         Duration of Discharge Encounter: Greater than 30 minutes including physician and PA time.  Signed, Janee Morn, Amir Fick R PA-C 05/21/2014, 8:06 AM

## 2014-05-21 NOTE — Progress Notes (Addendum)
Pt just returned from walking, feels good, no c/o. Ed completed/reviewed. Encouraged pt to exercise almost daily and continue walking on golf [place]. Declined doing CRPII again (just did last year). Understands importance of his meds.  [id number] Ethelda Chick CES, ACSM 9:04 AM 05/21/2014

## 2014-06-03 ENCOUNTER — Ambulatory Visit (INDEPENDENT_AMBULATORY_CARE_PROVIDER_SITE_OTHER): Payer: Medicare Other | Admitting: Cardiology

## 2014-06-03 ENCOUNTER — Encounter: Payer: Self-pay | Admitting: Cardiology

## 2014-06-03 VITALS — BP 114/70 | HR 79 | Ht 65.0 in | Wt 166.0 lb

## 2014-06-03 DIAGNOSIS — I2510 Atherosclerotic heart disease of native coronary artery without angina pectoris: Secondary | ICD-10-CM

## 2014-06-03 DIAGNOSIS — I10 Essential (primary) hypertension: Secondary | ICD-10-CM | POA: Diagnosis not present

## 2014-06-03 NOTE — Patient Instructions (Signed)
Your physician recommends that you schedule a follow-up appointment in:  3 months with Dr McDowell     Your physician recommends that you continue on your current medications as directed. Please refer to the Current Medication list given to you today.      Thank you for choosing [facility] !         

## 2014-06-03 NOTE — Progress Notes (Signed)
Cardiology Office Note  Date: 06/03/2014   ID: Keith Keith., DOB Feb 22, 1945, MRN [id number]  PCP: Kirstie Peri, MD  Primary Cardiologist: Nona Dell, MD   Chief Complaint  Patient presents with  . Hospitalization Follow-up  . Coronary Artery Disease    History of Present Illness: Keith Keith. is a 69 y.o. male last seen in February and clinically stable at that time. Recent records reviewed finding that he was admitted to [facility] in March with chest pain (unstable angina not STEMI) and underwent urgent cardiac catheterization that demonstrated a new mid LAD stenosis that was managed with DES, otherwise stable disease with patent stents in the OM and RCA distributions. Cardiac enzymes were negative. Echocardiogram showed normal LVEF without new wall motion abnormalities.  He is here with his wife today for follow-up. We reviewed his recent hospital stay. He does not endorse any recurring chest pain and states that he remains compliant with his medications. I suspect that he had a plaque rupture event in the LAD - we discussed the natural history of CAD and plan to continue medical therapy and risk factor modification. His lipids have been well managed as noted below, and his blood pressure is normal today.  He states that he is seeking further disability and work restrictions through the [facility] system, cardiac medical records have been requested. I explained that we would forward this through our medical record system to have the appropriate information sent on.   Past Medical History  Diagnosis Date  . Essential hypertension, benign   . Thrombocytopenia   . CAD (coronary artery disease)     a. 05/19/13: DES x 2 to RCA, staged DES to OM 05/22/13, residual LAD/diag disease for Rx mgmt  b. 05/20/14 [place] s/p DES to LAD  . GERD (gastroesophageal reflux disease)   . CKD (chronic kidney disease), stage II   . Arthritis     Past Surgical History  Procedure Laterality  Date  . Bilateral hip replacements    . Cardiac catheterization    . Coronary stent placement  05/19/13  . Left heart catheterization with coronary angiogram N/A 05/19/2013    Procedure: LEFT HEART CATHETERIZATION WITH CORONARY ANGIOGRAM;  Surgeon: Lennette Bihari, MD;  Location: [facility] C[facility] LAB;  Service: Cardiovascular;  Laterality: N/A;  . Percutaneous coronary stent intervention (pci-s) N/A 05/22/2013    Procedure: PERCUTANEOUS CORONARY STENT INTERVENTION (PCI-S);  Surgeon: Kathleene Hazel, MD;  Location: [facility] CATH LAB;  Service: Cardiovascular;  Laterality: N/A;  . Fractional flow reserve wire Right 05/22/2013    Procedure: FRACTIONAL FLOW RESERVE WIRE;  Surgeon: Kathleene Hazel, MD;  Location: [facility] CATH LAB;  Service: Cardiovascular;  Laterality: Right;  . Coronary angioplasty with stent placement  05/20/2014    mid lad   . Left heart catheterization with coronary angiogram N/A 05/20/2014    Procedure: LEFT HEART CATHETERIZATION WITH CORONARY ANGIOGRAM;  Surgeon: Kathleene Hazel, MD;  Location: [facility] CATH LAB;  Service: Cardiovascular;  Laterality: N/A;    Current Outpatient Prescriptions  Medication Sig Dispense Refill  . aspirin EC 81 MG tablet Take 81 mg by mouth daily.    Marland Kitchen atorvastatin (LIPITOR) 40 MG tablet Take 1 tablet (40 mg total) by mouth every evening. 30 tablet 6  . BRILINTA 90 MG TABS tablet TAKE 1 TABLET TWICE A DAY 3 tablet 2  . Cholecalciferol (VITAMIN D-3) 1000 UNITS CAPS Take 1,000 Units by mouth daily.     . hydrochlorothiazide (MICROZIDE)  12.5 MG capsule Take 1 capsule (12.5 mg total) by mouth daily. 90 capsule 3  . lisinopril (PRINIVIL,ZESTRIL) 20 MG tablet Take 1 tablet (20 mg total) by mouth daily. 90 tablet 3  . metoprolol tartrate (LOPRESSOR) 25 MG tablet Take 25 mg by mouth 2 (two) times daily.    . nitroGLYCERIN (NITROSTAT) 0.4 MG SL tablet Place 1 tablet (0.4 mg total) under the tongue every 5 (five) minutes as needed for chest pain (up to 3 doses). 25  tablet 3  . omeprazole (PRILOSEC) 20 MG capsule Take 20 mg by mouth 2 (two) times daily before a meal.    . vitamin B-12 (CYANOCOBALAMIN) 1000 MCG tablet Take 1,000 mcg by mouth daily.     No current facility-administered medications for this visit.    Allergies:  Review of patient's allergies indicates no known allergies.   Social History: The patient  reports that he quit smoking about 12 years ago. His smoking use included Cigarettes. He started smoking about 48 years ago. He has a 38 pack-year smoking history. He has never used smokeless tobacco. He reports that he drinks about 0.6 oz of alcohol per week. He reports that he does not use illicit drugs.    ROS:  Please see the history of present illness. Otherwise, complete review of systems is positive for none.  All other systems are reviewed and negative.   Physical Exam: VS:  BP 114/70 mmHg  Pulse 79  Ht 5\' 5"  (1.651 m)  Wt 166 lb (75.297 kg)  BMI 27.62 kg/m2  SpO2 98%, BMI Body mass index is 27.62 kg/(m^2).  Wt Readings from Last 3 Encounters:  06/03/14 166 lb (75.297 kg)  05/21/14 158 lb 11.7 oz (72 kg)  03/26/14 164 lb (74.39 kg)     General: Patient appears comfortable at rest. HEENT: Conjunctiva and lids normal, oropharynx clear. Neck: Supple, no elevated JVP or carotid bruits, no thyromegaly. Lungs: Clear to auscultation, nonlabored breathing at rest. Cardiac: Regular rate and rhythm, no S3 or significant systolic murmur, no pericardial rub. Abdomen: Soft, nontender, bowel sounds present. Extremities: No pitting edema, distal pulses 2+. Radial cardiac catheterization access site stable. Skin: Warm and dry. Musculoskeletal: No kyphosis. Neuropsychiatric: Alert and oriented x3, affect grossly appropriate.   ECG: ECG is not ordered today.  Recent Labwork: 05/20/2014: ALT 20; AST 27 05/21/2014: BUN 12; Creatinine 1.57*; Hemoglobin 13.8; Platelets 100*; Potassium 4.2; Sodium 139     Component Value Date/Time   CHOL  128 05/20/2013 0047   TRIG 92 05/20/2013 0047   HDL 42 05/20/2013 0047   CHOLHDL 3.0 05/20/2013 0047   VLDL 18 05/20/2013 0047   LDLCALC 68 05/20/2013 0047    Other Studies Reviewed Today:  Echocardiogram 05/20/2014: Study Conclusions  - Left ventricle: The cavity size was normal. Wall thickness was increased in a pattern of moderate LVH. Systolic function was normal. The estimated ejection fraction was in the range of 60% to 65%. Wall motion was normal; there were no regional wall motion abnormalities. Doppler parameters are consistent with abnormal left ventricular relaxation (grade 1 diastolic dysfunction). The E/e&' ratio is between 8-15, suggesting indeterminate LV filling pressure. - Aortic valve: Sclerosis without stenosis. There was no regurgitation. - Mitral valve: There was no significant regurgitation. - Left atrium: The atrium was normal in size. - Right ventricle: The cavity size was normal. Wall thickness was normal. Systolic function is reduced. - Right atrium: The atrium was normal in size.  Impressions:  - LVEF 60-65%, moderate LVH,  diastolic dysfunction, indeterminate (but likely elevated LV Filling pressure), normal LA size, mildly reduced RV function by TAPSE, no significant TR   ASSESSMENT AND PLAN:  1. Recent presentation with unstable angina, new mid LAD stenosis documented at heart catheterization and now status post placement of DES. Otherwise he had stable coronary anatomy including patent stent sites in the obtuse marginal and RCA. LVEF normal by echocardiogram. Medical therapy continues including DAPT. Encouraged regular walking for exercise as before.  2. Multivessel CAD as outlined above. Plan to continue medical therapy and risk factor modification.  3. Essential hypertension, blood pressure is normal today.  Current medicines were reviewed with the patient today. No changes were made.   Disposition: FU with me in 3  months.   Signed, Jonelle Sidle, MD, [facility] 06/03/2014 2:06 PM    [facility] at [facility] [address] [place], [place] [address] Phone: [phone]; Fax: [phone]

## 2014-07-25 ENCOUNTER — Ambulatory Visit (INDEPENDENT_AMBULATORY_CARE_PROVIDER_SITE_OTHER): Payer: Medicare Other | Admitting: Urology

## 2014-07-25 DIAGNOSIS — R972 Elevated prostate specific antigen [PSA]: Secondary | ICD-10-CM

## 2014-07-31 ENCOUNTER — Other Ambulatory Visit: Payer: Self-pay | Admitting: Adult Health

## 2014-09-01 ENCOUNTER — Other Ambulatory Visit: Payer: Self-pay | Admitting: Cardiology

## 2014-11-07 ENCOUNTER — Observation Stay (HOSPITAL_COMMUNITY)
Admission: EM | Admit: 2014-11-07 | Discharge: 2014-11-07 | Disposition: A | Discharge status: Home / Self Care | Payer: Medicare Other | Attending: Internal Medicine | Admitting: Internal Medicine

## 2014-11-07 ENCOUNTER — Emergency Department (HOSPITAL_COMMUNITY): Payer: Medicare Other

## 2014-11-07 ENCOUNTER — Encounter (HOSPITAL_COMMUNITY): Payer: Self-pay | Admitting: *Deleted

## 2014-11-07 DIAGNOSIS — R079 Chest pain, unspecified: Secondary | ICD-10-CM | POA: Diagnosis present

## 2014-11-07 DIAGNOSIS — Z79899 Other long term (current) drug therapy: Secondary | ICD-10-CM | POA: Diagnosis not present

## 2014-11-07 DIAGNOSIS — I2510 Atherosclerotic heart disease of native coronary artery without angina pectoris: Secondary | ICD-10-CM | POA: Diagnosis not present

## 2014-11-07 DIAGNOSIS — Z7982 Long term (current) use of aspirin: Secondary | ICD-10-CM | POA: Insufficient documentation

## 2014-11-07 DIAGNOSIS — D696 Thrombocytopenia, unspecified: Secondary | ICD-10-CM | POA: Diagnosis not present

## 2014-11-07 DIAGNOSIS — Z87891 Personal history of nicotine dependence: Secondary | ICD-10-CM | POA: Insufficient documentation

## 2014-11-07 DIAGNOSIS — R0789 Other chest pain: Secondary | ICD-10-CM | POA: Diagnosis not present

## 2014-11-07 DIAGNOSIS — K219 Gastro-esophageal reflux disease without esophagitis: Secondary | ICD-10-CM | POA: Insufficient documentation

## 2014-11-07 DIAGNOSIS — I129 Hypertensive chronic kidney disease with stage 1 through stage 4 chronic kidney disease, or unspecified chronic kidney disease: Secondary | ICD-10-CM | POA: Diagnosis not present

## 2014-11-07 DIAGNOSIS — N182 Chronic kidney disease, stage 2 (mild): Secondary | ICD-10-CM | POA: Diagnosis present

## 2014-11-07 DIAGNOSIS — M1990 Unspecified osteoarthritis, unspecified site: Secondary | ICD-10-CM | POA: Insufficient documentation

## 2014-11-07 LAB — BASIC METABOLIC PANEL
Anion gap: 6 (ref 5–15)
BUN: 11 mg/dL (ref 6–20)
CO2: 28 mmol/L (ref 22–32)
CREATININE: 1.61 mg/dL — AB (ref 0.61–1.24)
Calcium: 9.1 mg/dL (ref 8.9–10.3)
Chloride: 104 mmol/L (ref 101–111)
GFR calc Af Amer: 49 mL/min — ABNORMAL LOW (ref >60)
GFR, EST NON AFRICAN AMERICAN: 42 mL/min — AB (ref >60)
GLUCOSE: 116 mg/dL — AB (ref 65–99)
POTASSIUM: 3.8 mmol/L (ref 3.5–5.1)
SODIUM: 138 mmol/L (ref 135–145)

## 2014-11-07 LAB — CBC
HEMATOCRIT: 43 % (ref 39.0–52.0)
Hemoglobin: 14.9 g/dL (ref 13.0–17.0)
MCH: 30.5 pg (ref 26.0–34.0)
MCHC: 34.7 g/dL (ref 30.0–36.0)
MCV: 88.1 fL (ref 78.0–100.0)
PLATELETS: 113 10*3/uL — AB (ref 150–400)
RBC: 4.88 MIL/uL (ref 4.22–5.81)
RDW: 13.4 % (ref 11.5–15.5)
WBC: 6.7 10*3/uL (ref 4.0–10.5)

## 2014-11-07 LAB — TROPONIN I: Troponin I: <0.03 ng/mL (ref <0.031)

## 2014-11-07 MED ORDER — NITROGLYCERIN 2 % TD OINT
1.0000 [in_us] | TOPICAL_OINTMENT | Freq: Once | TRANSDERMAL | Status: AC
  Administered 2014-11-07: 1 [in_us] via TOPICAL
  Filled 2014-11-07: qty 1

## 2014-11-07 MED ORDER — NITROGLYCERIN 0.4 MG SL SUBL: 0.4000 mg | SUBLINGUAL_TABLET | SUBLINGUAL | Status: DC | PRN

## 2014-11-07 MED ORDER — VITAMIN B-12 1000 MCG PO TABS
1000.0000 ug | ORAL_TABLET | Freq: Every day | ORAL | Status: DC
  Administered 2014-11-07: 1000 ug via ORAL
  Filled 2014-11-07: qty 1

## 2014-11-07 MED ORDER — OMEPRAZOLE 40 MG PO CPDR: 40.0000 mg | DELAYED_RELEASE_CAPSULE | Freq: Every day | ORAL | Status: AC

## 2014-11-07 MED ORDER — SODIUM CHLORIDE 0.9 % IJ SOLN
3.0000 mL | Freq: Two times a day (BID) | INTRAMUSCULAR | Status: DC
  Administered 2014-11-07: 3 mL via INTRAVENOUS

## 2014-11-07 MED ORDER — ATORVASTATIN CALCIUM 40 MG PO TABS: 40.0000 mg | ORAL_TABLET | Freq: Every evening | ORAL | Status: DC

## 2014-11-07 MED ORDER — PANTOPRAZOLE SODIUM 40 MG IV SOLR
40.0000 mg | Freq: Two times a day (BID) | INTRAVENOUS | Status: DC
  Administered 2014-11-07: 40 mg via INTRAVENOUS
  Filled 2014-11-07: qty 40

## 2014-11-07 MED ORDER — LISINOPRIL-HYDROCHLOROTHIAZIDE 20-12.5 MG PO TABS: 1.0000 | ORAL_TABLET | Freq: Every day | ORAL | Status: DC

## 2014-11-07 MED ORDER — HYDROCHLOROTHIAZIDE 12.5 MG PO CAPS
12.5000 mg | ORAL_CAPSULE | Freq: Every day | ORAL | Status: DC
  Administered 2014-11-07: 12.5 mg via ORAL
  Filled 2014-11-07: qty 1

## 2014-11-07 MED ORDER — LISINOPRIL 10 MG PO TABS
20.0000 mg | ORAL_TABLET | Freq: Every day | ORAL | Status: DC
  Administered 2014-11-07: 20 mg via ORAL
  Filled 2014-11-07: qty 2

## 2014-11-07 MED ORDER — ASPIRIN 81 MG PO CHEW
324.0000 mg | CHEWABLE_TABLET | Freq: Once | ORAL | Status: AC
  Administered 2014-11-07: 324 mg via ORAL
  Filled 2014-11-07: qty 4

## 2014-11-07 MED ORDER — ASPIRIN EC 81 MG PO TBEC
81.0000 mg | DELAYED_RELEASE_TABLET | Freq: Every day | ORAL | Status: DC
  Administered 2014-11-07: 81 mg via ORAL
  Filled 2014-11-07: qty 1

## 2014-11-07 MED ORDER — ALUM & MAG HYDROXIDE-SIMETH 200-200-20 MG/5ML PO SUSP
30.0000 mL | Freq: Four times a day (QID) | ORAL | Status: DC | PRN
Start: 2014-11-07 — End: 2014-11-07

## 2014-11-07 MED ORDER — VITAMIN D 1000 UNITS PO TABS
1000.0000 [IU] | ORAL_TABLET | Freq: Every day | ORAL | Status: DC
  Administered 2014-11-07: 1000 [IU] via ORAL
  Filled 2014-11-07 (×2): qty 1

## 2014-11-07 MED ORDER — TICAGRELOR 90 MG PO TABS
90.0000 mg | ORAL_TABLET | Freq: Two times a day (BID) | ORAL | Status: DC
  Administered 2014-11-07: 90 mg via ORAL
  Filled 2014-11-07 (×7): qty 1

## 2014-11-07 MED ORDER — METOPROLOL TARTRATE 25 MG PO TABS
25.0000 mg | ORAL_TABLET | Freq: Two times a day (BID) | ORAL | Status: DC
  Administered 2014-11-07: 25 mg via ORAL
  Filled 2014-11-07: qty 1

## 2014-11-07 MED ORDER — MORPHINE SULFATE (PF) 2 MG/ML IV SOLN: 2.0000 mg | INTRAVENOUS | Status: DC | PRN

## 2014-11-07 NOTE — ED Provider Notes (Signed)
CSN: [id number]     Arrival date & time 11/07/14  0308 History   First MD Initiated Contact with Patient 11/07/14 0410   Chief Complaint  Patient presents with  . Chest Pain     (Consider location/radiation/quality/duration/timing/severity/associated sxs/prior Treatment) HPI patient has a history coronary artery disease. He has had stents placed in his RCA both distally and in the midportion, his circumflex and most recently in March he had a DES placed in his mid LAD. He states he complained golf without difficulty. Today he hit balls and felt fine. He states about 8 PM this evening while laying down he started having pain in the center of his chest that he describes as dull. The pain would come and go and last 5 to 10 minutes at a time. Nothing he did made it hurt more, nothing he did make it go away. The pain did not radiate. He had no shortness of breath. He had some mild nausea once without vomiting. He states he's had this discomfort before when he had his heart problems and when he needed his stents. Last episode of pain was at 2 AM this morning. Patient is on Brilinta. Patient states he has nitroglycerin pills at home however he is afraid to use it.  PCP Dr Sherryll Burger in [facility] Cardiology Dr Diona Browner  Past Medical History  Diagnosis Date  . Essential hypertension, benign   . Thrombocytopenia   . CAD (coronary artery disease)     a. 05/19/13: DES x 2 to RCA, staged DES to OM 05/22/13, residual LAD/diag disease for Rx mgmt  b. 05/20/14 [place] s/p DES to LAD  . GERD (gastroesophageal reflux disease)   . CKD (chronic kidney disease), stage II   . Arthritis    Past Surgical History  Procedure Laterality Date  . Bilateral hip replacements    . Cardiac catheterization    . Coronary stent placement  05/19/13  . Left heart catheterization with coronary angiogram N/A 05/19/2013    Procedure: LEFT HEART CATHETERIZATION WITH CORONARY ANGIOGRAM;  Surgeon: Lennette Bihari, MD;  Location: [facility] CATH LAB;   Service: Cardiovascular;  Laterality: N/A;  . Percutaneous coronary stent intervention (pci-s) N/A 05/22/2013    Procedure: PERCUTANEOUS CORONARY STENT INTERVENTION (PCI-S);  Surgeon: Kathleene Hazel, MD;  Location: [facility] CATH LAB;  Service: Cardiovascular;  Laterality: N/A;  . Fractional flow reserve wire Right 05/22/2013    Procedure: FRACTIONAL FLOW RESERVE WIRE;  Surgeon: Kathleene Hazel, MD;  Location: [facility] CATH LAB;  Service: Cardiovascular;  Laterality: Right;  . Coronary angioplasty with stent placement  05/20/2014    mid lad   . Left heart catheterization with coronary angiogram N/A 05/20/2014    Procedure: LEFT HEART CATHETERIZATION WITH CORONARY ANGIOGRAM;  Surgeon: Kathleene Hazel, MD;  Location: [facility] CATH LAB;  Service: Cardiovascular;  Laterality: N/A;   Family History  Problem Relation Age of Onset  . Aneurysm Mother   . Heart disease Sister   . Diabetes Sister   . Diabetes Sister    Social History  Substance Use Topics  . Smoking status: Former Smoker -- 1.00 packs/day for 38 years    Types: Cigarettes    Start date: 10/16/1965    Quit date: 02/21/2002  . Smokeless tobacco: Never Used  . Alcohol Use: 0.6 oz/week    1 Glasses of wine per week  lives at home Lives with spouse  Review of Systems  All other systems reviewed and are negative.     Allergies  Review of patient's allergies indicates no known allergies.  Home Medications   Prior to Admission medications   Medication Sig Start Date End Date Taking? Authorizing Provider  aspirin EC 81 MG tablet Take 81 mg by mouth daily.    Historical Provider, MD  atorvastatin (LIPITOR) 40 MG tablet Take 1 tablet (40 mg total) by mouth every evening. 05/23/13   Dayna N Dunn, PA-C  BRILINTA 90 MG TABS tablet TAKE 1 TABLET TWICE A DAY 05/09/14   Jonelle Sidle, MD  Cholecalciferol (VITAMIN D-3) 1000 UNITS CAPS Take 1,000 Units by mouth daily.     Historical Provider, MD  hydrochlorothiazide (MICROZIDE) 12.5  MG capsule TAKE 1 CAPSULE DAILY 07/31/14   Jonelle Sidle, MD  lisinopril (PRINIVIL,ZESTRIL) 20 MG tablet Take 1 tablet (20 mg total) by mouth daily. 07/17/13   Jonelle Sidle, MD  lisinopril-hydrochlorothiazide (PRINZIDE,ZESTORETIC) 20-12.5 MG per tablet TAKE 1 TABLET DAILY 09/02/14   Jonelle Sidle, MD  metoprolol tartrate (LOPRESSOR) 25 MG tablet Take 25 mg by mouth 2 (two) times daily.    Historical Provider, MD  nitroGLYCERIN (NITROSTAT) 0.4 MG SL tablet Place 1 tablet (0.4 mg total) under the tongue every 5 (five) minutes as needed for chest pain (up to 3 doses). 05/23/13   Dayna N Dunn, PA-C  omeprazole (PRILOSEC) 20 MG capsule Take 20 mg by mouth 2 (two) times daily before a meal.    Historical Provider, MD  vitamin B-12 (CYANOCOBALAMIN) 1000 MCG tablet Take 1,000 mcg by mouth daily.    Historical Provider, MD   BP 124/75 mmHg  Pulse 56  Temp(Src) 97.7 F (36.5 C) (Oral)  Resp 11  Ht 5\' 5"  (1.651 m)  Wt 160 lb (72.576 kg)  BMI 26.63 kg/m2  SpO2 99%  Vital signs normal   Physical Exam  Constitutional: He is oriented to person, place, and time. He appears well-developed and well-nourished.  Non-toxic appearance. He does not appear ill. No distress.  HENT:  Head: Normocephalic and atraumatic.  Right Ear: External ear normal.  Left Ear: External ear normal.  Nose: Nose normal. No mucosal edema or rhinorrhea.  Mouth/Throat: Oropharynx is clear and moist and mucous membranes are normal. No dental abscesses or uvula swelling.  Eyes: Conjunctivae and EOM are normal. Pupils are equal, round, and reactive to light.  Neck: Normal range of motion and full passive range of motion without pain. Neck supple.  Cardiovascular: Normal rate, regular rhythm and normal heart sounds.  Exam reveals no gallop and no friction rub.   No murmur heard. Pulmonary/Chest: Effort normal and breath sounds normal. No respiratory distress. He has no wheezes. He has no rhonchi. He has no rales. He exhibits no  tenderness and no crepitus.  Abdominal: Soft. Normal appearance and bowel sounds are normal. He exhibits no distension. There is no tenderness. There is no rebound and no guarding.  Musculoskeletal: Normal range of motion. He exhibits no edema or tenderness.  Moves all extremities well.   Neurological: He is alert and oriented to person, place, and time. He has normal strength. No cranial nerve deficit.  Skin: Skin is warm, dry and intact. No rash noted. No erythema. No pallor.  Psychiatric: He has a normal mood and affect. His speech is normal and behavior is normal. His mood appears not anxious.  Nursing note and vitals reviewed.   ED Course  Procedures (including critical care time)  Medications  aspirin chewable tablet 324 mg (324 mg Oral Given 11/07/14 0436)  nitroGLYCERIN (  NITROGLYN) 2 % ointment 1 inch (1 inch Topical Given 11/07/14 0436)   Patient was given full dose aspirin to chew and nitroglycerin paste to his chest.  I had discussed he would probably need to be admitted depending on his lab tests.  05:23 Dr Conley Rolls admit to observation   Echocardiogram 05/20/2014: Study Conclusions  - Left ventricle: The cavity size was normal. Wall thickness was increased in a pattern of moderate LVH. Systolic function was normal. The estimated ejection fraction was in the range of 60% to 65%. Wall motion was normal; there were no regional wall motion abnormalities. Doppler parameters are consistent with abnormal left ventricular relaxation (grade 1 diastolic dysfunction). The E/e&' ratio is between 8-15, suggesting indeterminate LV filling pressure. - Aortic valve: Sclerosis without stenosis. There was no regurgitation. - Mitral valve: There was no significant regurgitation. - Left atrium: The atrium was normal in size. - Right ventricle: The cavity size was normal. Wall thickness was normal. Systolic function is reduced. - Right atrium: The atrium was normal in  size.    Labs Review Results for orders placed or performed during the hospital encounter of 11/07/14  Basic metabolic panel  Result Value Ref Range   Sodium 138 135 - 145 mmol/L   Potassium 3.8 3.5 - 5.1 mmol/L   Chloride 104 101 - 111 mmol/L   CO2 28 22 - 32 mmol/L   Glucose, Bld 116 (H) 65 - 99 mg/dL   BUN 11 6 - 20 mg/dL   Creatinine, Ser 7.41 (H) 0.61 - 1.24 mg/dL   Calcium 9.1 8.9 - 63.8 mg/dL   GFR calc non Af Amer 42 (L) >60 mL/min   GFR calc Af Amer 49 (L) >60 mL/min   Anion gap 6 5 - 15  CBC  Result Value Ref Range   WBC 6.7 4.0 - 10.5 K/uL   RBC 4.88 4.22 - 5.81 MIL/uL   Hemoglobin 14.9 13.0 - 17.0 g/dL   HCT 45.3 64.6 - 80.3 %   MCV 88.1 78.0 - 100.0 fL   MCH 30.5 26.0 - 34.0 pg   MCHC 34.7 30.0 - 36.0 g/dL   RDW 21.2 24.8 - 25.0 %   Platelets 113 (L) 150 - 400 K/uL  Troponin I  Result Value Ref Range   Troponin I <0.03 <0.031 ng/mL    Laboratory interpretation all normal except stable renal insufficiency   Imaging Review Dg Chest 2 View  11/07/2014   CLINICAL DATA:  69 year old male with chest pain  EXAM: CHEST  2 VIEW  COMPARISON:  Radiograph dated 05/20/2014  FINDINGS: The heart size and mediastinal contours are within normal limits. Both lungs are clear. The visualized skeletal structures are unremarkable.  IMPRESSION: No active cardiopulmonary disease.   Electronically Signed   By: Elgie Collard M.D.   On: 11/07/2014 04:10   I have personally reviewed and evaluated these images and lab results as part of my medical decision-making.   EKG Interpretation   Date/Time:  Friday November 07 2014 03:19:35 EDT Ventricular Rate:  71 PR Interval:  167 QRS Duration: 100 QT Interval:  420 QTC Calculation: 456 R Axis:   72 Text Interpretation:  Sinus rhythm Borderline low voltage, extremity leads  Diffuse ST elevation, consider early repolarization, pericarditis, or  injury No significant change since last tracing 21 May 2014 Confirmed by  KNAPP   MD-I, IVA ([id number]) on 11/07/2014 3:23:17 AM      MDM   Final diagnoses:  Atypical chest pain  Plan admission  Devoria Albe, MD, Concha Pyo, MD 11/07/14 [id number]

## 2014-11-07 NOTE — Discharge Planning (Signed)
Pt IV and tele removed.  DC papers given, explained and educated.  VSS and RN assessment revealed stability for discharge.  Pt told of suggested FU appts and given scripts.  Pt will be wheeled downstairs and driving self home.  Pt has not received pain meds within 24 hours.

## 2014-11-07 NOTE — ED Notes (Signed)
Pt reporting chest pain that started this evening.  Reports pain as staying primarily in center in the chest, denies radiation into neck or arms. Reporting mild nausea, denies SOB or diaphoresis.

## 2014-11-07 NOTE — Discharge Summary (Signed)
Physician Discharge Summary  Keith Keith. ZOX:[id number] DOB: 04-May-1945 DOA: 11/07/2014  PCP: Kirstie Peri, MD  Admit date: 11/07/2014 Discharge date: 11/07/2014  Time spent: 30 minutes. minutes  Recommendations for Outpatient Follow-up:  1. Recommend follow-up of the patient's renal function. 2. Recommend consideration of oral long acting nitrate in the outpatient setting.    Discharge Diagnoses:  1. Atypical chest pain. Troponin I negative 2. 2. Coronary artery disease, status post stenting in March 2015, April 2015, and March 2016. 3. Essential hypertension. 4. Hyperlipidemia. 4. GERD. 5. Stage II chronic kidney disease. 6. Chronic thrombocytopenia.    Discharge Condition: Improved.  Diet recommendation: Heart healthy.  Filed Weights   11/07/14 0316 11/07/14 0627  Weight: 72.576 kg (160 lb) 72.576 kg (160 lb)    History of present illness:  The patient is a 69 year old man with a history of CAD s/p 05/19/13: DES x 2 to RCA, staged DES to OM 05/22/13, residual LAD/diag disease for Rx mgmt b. 05/20/14 [place] s/p DES to LAD, GERD, hypertension, and hyperlipidemia, who presented to the emergency department on 11/07/2014 with a chief complaint of fleeting chest pain. The pain lasted less than 5 minutes. It was not associated with shortness of breath, but with some minimal nausea. He denied diaphoresis. He had not taken his probably resect for several days. He decided not to take sublingual nitroglycerin. In the ED, he was hemodynamically stable and afebrile. His EKG revealed chronic ST changes but no new acute changes. History of pontine I was negative. His chest x-ray revealed no acute cardiopulmonary findings. He was admitted for further evaluation and management.  Hospital Course:  The patient was restarted on his chronic medications including aspirin, Brilinta, Lipitor, lisinopril/HCTZ, and metoprolol. PPI therapy was restarted with Protonix IV. Nitroglycerin ointment was added  scheduled and when necessary morphine was ordered. Cardiac enzymes were ordered for further evaluation. His troponin I was negative 2. The patient wanted to go home after the second negative cardiac enzyme, which was felt to be reasonable as he was chest pain-free.  The patient remained hemodynamically stable and afebrile. His blood pressure did decrease some, but this was thought to be secondary to the nitroglycerin ointment. It was discontinued at the time of discharge. His creatinine was practically at baseline. His platelet count which is chronically low was at baseline.  Throughout hospitalization, he remained chest pain-free. He had no complaints of chest pain or shortness of breath at the time of discharge. He was given a prescription for sublingual nitroglycerin and for an increased dose of omeprazole (dose increased from 20 mg to 40 mg daily). He was instructed to follow up with cardiology nurse practitioner Ms. Lawrence next week as scheduled. He was instructed to come back to the ED if he had recurrent chest pain.  Procedures:  None  Consultations:  None  Discharge Exam: Filed Vitals:   11/07/14 1428  BP: 97/59  Pulse: 69  Temp: 98.8 F (37.1 C)  Resp: 18    General: Pleasant 69 year old African-American man in no acute distress. Cardiovascular: S1, S2, no murmurs rubs or gallops. Respiratory: Clear to auscultation bilaterally. Abdomen: Positive bowel sounds, soft, nontender, nondistended. Extremities: No pedal edema. No calf tenderness.  Discharge Instructions   Discharge Instructions    Diet - low sodium heart healthy    Complete by:  As directed      Increase activity slowly    Complete by:  As directed           Current  Discharge Medication List    CONTINUE these medications which have CHANGED   Details  nitroGLYCERIN (NITROSTAT) 0.4 MG SL tablet Place 1 tablet (0.4 mg total) under the tongue every 5 (five) minutes as needed for chest pain (up to 3  doses). Qty: 25 tablet, Refills: 3    omeprazole (PRILOSEC) 40 MG capsule Take 1 capsule (40 mg total) by mouth at bedtime. Qty: 30 capsule, Refills: 6      CONTINUE these medications which have NOT CHANGED   Details  aspirin EC 81 MG tablet Take 81 mg by mouth daily.    atorvastatin (LIPITOR) 40 MG tablet Take 1 tablet (40 mg total) by mouth every evening. Qty: 30 tablet, Refills: 6    BRILINTA 90 MG TABS tablet TAKE 1 TABLET TWICE A DAY Qty: 3 tablet, Refills: 2    Cholecalciferol (VITAMIN D-3) 1000 UNITS CAPS Take 1,000 Units by mouth daily.     lisinopril-hydrochlorothiazide (PRINZIDE,ZESTORETIC) 20-12.5 MG per tablet TAKE 1 TABLET DAILY Qty: 90 tablet, Refills: 2    metoprolol tartrate (LOPRESSOR) 25 MG tablet Take 25 mg by mouth 2 (two) times daily.    vitamin B-12 (CYANOCOBALAMIN) 1000 MCG tablet Take 1,000 mcg by mouth daily.       No Known Allergies Follow-up Information    Follow up with Joni Reining, NP On 11/14/2014.   Specialties:  Nurse Practitioner, Radiology, Cardiology   Why:  At 2:30 PM   Contact information:   [address] [place] [address] [phone]        The results of significant diagnostics from this hospitalization (including imaging, microbiology, ancillary and laboratory) are listed below for reference.    Significant Diagnostic Studies: Dg Chest 2 View  11/07/2014   CLINICAL DATA:  69 year old male with chest pain  EXAM: CHEST  2 VIEW  COMPARISON:  Radiograph dated 05/20/2014  FINDINGS: The heart size and mediastinal contours are within normal limits. Both lungs are clear. The visualized skeletal structures are unremarkable.  IMPRESSION: No active cardiopulmonary disease.   Electronically Signed   By: Elgie Collard M.D.   On: 11/07/2014 04:10    Microbiology: No results found for this or any previous visit (from the past 240 hour(s)).   Labs: Basic Metabolic Panel:  Recent Labs Lab 11/07/14 0330  NA 138  K 3.8  CL 104   CO2 28  GLUCOSE 116*  BUN 11  CREATININE 1.61*  CALCIUM 9.1   Liver Function Tests: No results for input(s): AST, ALT, ALKPHOS, BILITOT, PROT, ALBUMIN in the last 168 hours. No results for input(s): LIPASE, AMYLASE in the last 168 hours. No results for input(s): AMMONIA in the last 168 hours. CBC:  Recent Labs Lab 11/07/14 0330  WBC 6.7  HGB 14.9  HCT 43.0  MCV 88.1  PLT 113*   Cardiac Enzymes:  Recent Labs Lab 11/07/14 0330 11/07/14 0918  TROPONINI <0.03 <0.03   BNP: BNP (last 3 results) No results for input(s): BNP in the last 8760 hours.  ProBNP (last 3 results) No results for input(s): PROBNP in the last 8760 hours.  CBG: No results for input(s): GLUCAP in the last 168 hours.     Signed:  FISHER,DENISE  [facility] 11/07/2014, 3:21 PM

## 2014-11-07 NOTE — Care Management Note (Signed)
Case Management Note  Patient Details  Name: Keith Keith. MRN: [id number] Date of Birth: 1945/08/01  Subjective/Objective:                  Pt admitted from home with CP. Pt lives with his wife and will return home at discharge. Pt is independent with ADL's.  Action/Plan: Anticipate discharge within 24 hours. No Cm needs noted.  Expected Discharge Date:                  Expected Discharge Plan:  Home/Self Care  In-House Referral:  NA  Discharge planning Services  CM Consult  Post Acute Care Choice:  NA Choice offered to:  NA  DME Arranged:    DME Agency:     HH Arranged:    HH Agency:     Status of Service:  Completed, signed off  Medicare Important Message Given:    Date Medicare IM Given:    Medicare IM give by:    Date Additional Medicare IM Given:    Additional Medicare Important Message give by:     If discussed at Long Length of Stay Meetings, dates discussed:    Additional Comments:  Cheryl Flash, RN 11/07/2014, 10:42 AM

## 2014-11-07 NOTE — H&P (Signed)
[facility] History and Physical  Keith Keith. RUE:[id number] DOB: May 14, 1945    PCP:   Kirstie Peri, MD   Chief Complaint: Fleeting chest pains.   HPI:  Keith Keith. is an 69 y.o. malewith hx of known CAD s/p 05/19/13: DES x 2 to RCA, staged DES to OM 05/22/13, residual LAD/diag disease for Rx mgmt  b. 05/20/14 [place] s/p DES to LAD, hx of GERD, HTN, thrombocytopenia, presented to the ER with several episodes of chest discomfort lasting less than 5 minutes, and associated with no SOB, but some nausea and mild diaphoresis.  He hadn't taken his Prilosec for several days.  Work up in the ER showed EKG with non specific ST segment changes, but not new finding.  He has negative troponin and negative chest xray.  In the ER, he was pain free.  Hospitalist was asked to admit for cardiac r/out   Rewiew of Systems:  Constitutional: Negative for malaise, fever and chills. No significant weight loss or weight gain Eyes: Negative for eye pain, redness and discharge, diplopia, visual changes, or flashes of light. ENMT: Negative for ear pain, hoarseness, nasal congestion, sinus pressure and sore throat. No headaches; tinnitus, drooling, or problem swallowing. Cardiovascular: Negative for chest pain, palpitations, diaphoresis, dyspnea and peripheral edema. ; No orthopnea, PND Respiratory: Negative for cough, hemoptysis, wheezing and stridor. No pleuritic chestpain. Gastrointestinal: Negative for nausea, vomiting, diarrhea, constipation, abdominal pain, melena, blood in stool, hematemesis, jaundice and rectal bleeding.    Genitourinary: Negative for frequency, dysuria, incontinence,flank pain and hematuria; Musculoskeletal: Negative for back pain and neck pain. Negative for swelling and trauma.;  Skin: . Negative for pruritus, rash, abrasions, bruising and skin lesion.; ulcerations Neuro: Negative for headache, lightheadedness and neck stiffness. Negative for weakness, altered level of consciousness  , altered mental status, extremity weakness, burning feet, involuntary movement, seizure and syncope.  Psych: negative for anxiety, depression, insomnia, tearfulness, panic attacks, hallucinations, paranoia, suicidal or homicidal ideation    Past Medical History  Diagnosis Date  . Essential hypertension, benign   . Thrombocytopenia   . CAD (coronary artery disease)     a. 05/19/13: DES x 2 to RCA, staged DES to OM 05/22/13, residual LAD/diag disease for Rx mgmt  b. 05/20/14 [place] s/p DES to LAD  . GERD (gastroesophageal reflux disease)   . CKD (chronic kidney disease), stage II   . Arthritis     Past Surgical History  Procedure Laterality Date  . Bilateral hip replacements    . Cardiac catheterization    . Coronary stent placement  05/19/13  . Left heart catheterization with coronary angiogram N/A 05/19/2013    Procedure: LEFT HEART CATHETERIZATION WITH CORONARY ANGIOGRAM;  Surgeon: Lennette Bihari, MD;  Location: [facility] CATH LAB;  Service: Cardiovascular;  Laterality: N/A;  . Percutaneous coronary stent intervention (pci-s) N/A 05/22/2013    Procedure: PERCUTANEOUS CORONARY STENT INTERVENTION (PCI-S);  Surgeon: Kathleene Hazel, MD;  Location: [facility] CATH LAB;  Service: Cardiovascular;  Laterality: N/A;  . Fractional flow reserve wire Right 05/22/2013    Procedure: FRACTIONAL FLOW RESERVE WIRE;  Surgeon: Kathleene Hazel, MD;  Location: [facility] CATH LAB;  Service: Cardiovascular;  Laterality: Right;  . Coronary angioplasty with stent placement  05/20/2014    mid lad   . Left heart catheterization with coronary angiogram N/A 05/20/2014    Procedure: LEFT HEART CATHETERIZATION WITH CORONARY ANGIOGRAM;  Surgeon: Kathleene Hazel, MD;  Location: [facility] CATH LAB;  Service: Cardiovascular;  Laterality: N/A;  Medications:  HOME MEDS: Prior to Admission medications   Medication Sig Start Date End Date Taking? Authorizing Provider  aspirin EC 81 MG tablet Take 81 mg by mouth daily.    Historical  Provider, MD  atorvastatin (LIPITOR) 40 MG tablet Take 1 tablet (40 mg total) by mouth every evening. 05/23/13   Dayna N Dunn, PA-C  BRILINTA 90 MG TABS tablet TAKE 1 TABLET TWICE A DAY 05/09/14   Jonelle Sidle, MD  Cholecalciferol (VITAMIN D-3) 1000 UNITS CAPS Take 1,000 Units by mouth daily.     Historical Provider, MD  hydrochlorothiazide (MICROZIDE) 12.5 MG capsule TAKE 1 CAPSULE DAILY 07/31/14   Jonelle Sidle, MD  lisinopril (PRINIVIL,ZESTRIL) 20 MG tablet Take 1 tablet (20 mg total) by mouth daily. 07/17/13   Jonelle Sidle, MD  lisinopril-hydrochlorothiazide (PRINZIDE,ZESTORETIC) 20-12.5 MG per tablet TAKE 1 TABLET DAILY 09/02/14   Jonelle Sidle, MD  metoprolol tartrate (LOPRESSOR) 25 MG tablet Take 25 mg by mouth 2 (two) times daily.    Historical Provider, MD  nitroGLYCERIN (NITROSTAT) 0.4 MG SL tablet Place 1 tablet (0.4 mg total) under the tongue every 5 (five) minutes as needed for chest pain (up to 3 doses). 05/23/13   Dayna N Dunn, PA-C  omeprazole (PRILOSEC) 20 MG capsule Take 20 mg by mouth 2 (two) times daily before a meal.    Historical Provider, MD  vitamin B-12 (CYANOCOBALAMIN) 1000 MCG tablet Take 1,000 mcg by mouth daily.    Historical Provider, MD     Allergies:  No Known Allergies  Social History:   reports that he quit smoking about 12 years ago. His smoking use included Cigarettes. He started smoking about 49 years ago. He has a 38 pack-year smoking history. He has never used smokeless tobacco. He reports that he drinks about 0.6 oz of alcohol per week. He reports that he does not use illicit drugs.  Family History: Family History  Problem Relation Age of Onset  . Aneurysm Mother   . Heart disease Sister   . Diabetes Sister   . Diabetes Sister      Physical Exam: Filed Vitals:   11/07/14 0430 11/07/14 0500 11/07/14 0530 11/07/14 0600  BP: 124/75 113/68 121/78 117/74  Pulse: 56 68 67 60  Temp:      TempSrc:      Resp:  15 16 13   Height:       Weight:      SpO2: 99% 98% 99% 95%   Blood pressure 117/74, pulse 60, temperature 97.7 F (36.5 C), temperature source Oral, resp. rate 13, height 5\' 5"  (1.651 m), weight 72.576 kg (160 lb), SpO2 95 %.  GEN:  Pleasant  patient lying in the stretcher in no acute distress; cooperative with exam. PSYCH:  alert and oriented x4; does not appear anxious or depressed; affect is appropriate. HEENT: Mucous membranes pink and anicteric; PERRLA; EOM intact; no cervical lymphadenopathy nor thyromegaly or carotid bruit; no JVD; There were no stridor. Neck is very supple. Breasts:: Not examined CHEST WALL: No tenderness CHEST: Normal respiration, clear to auscultation bilaterally.  HEART: Regular rate and rhythm.  There are no murmur, rub, or gallops.   BACK: No kyphosis or scoliosis; no CVA tenderness ABDOMEN: soft and non-tender; no masses, no organomegaly, normal abdominal bowel sounds; no pannus; no intertriginous candida. There is no rebound and no distention. Rectal Exam: Not done EXTREMITIES: No bone or joint deformity; age-appropriate arthropathy of the hands and knees; no edema; no ulcerations.  There is no calf tenderness. Genitalia: not examined PULSES: 2+ and symmetric SKIN: Normal hydration no rash or ulceration CNS: Cranial nerves 2-12 grossly intact no focal lateralizing neurologic deficit.  Speech is fluent; uvula elevated with phonation, facial symmetry and tongue midline. DTR are normal bilaterally, cerebella exam is intact, barbinski is negative and strengths are equaled bilaterally.  No sensory loss.   Labs on Admission:  Basic Metabolic Panel:  Recent Labs Lab 11/07/14 0330  NA 138  K 3.8  CL 104  CO2 28  GLUCOSE 116*  BUN 11  CREATININE 1.61*  CALCIUM 9.1   CBC:  Recent Labs Lab 11/07/14 0330  WBC 6.7  HGB 14.9  HCT 43.0  MCV 88.1  PLT 113*   Cardiac Enzymes:  Recent Labs Lab 11/07/14 0330  TROPONINI <0.03   Radiological Exams on Admission: Dg Chest 2  View  11/07/2014   CLINICAL DATA:  69 year old male with chest pain  EXAM: CHEST  2 VIEW  COMPARISON:  Radiograph dated 05/20/2014  FINDINGS: The heart size and mediastinal contours are within normal limits. Both lungs are clear. The visualized skeletal structures are unremarkable.  IMPRESSION: No active cardiopulmonary disease.   Electronically Signed   By: Elgie Collard M.D.   On: 11/07/2014 04:10    EKG: Independently reviewed.   Assessment/Plan Present on Admission:  . Atypical chest pain . GERD (gastroesophageal reflux disease) . Chest pain  PLAN: patient has atypical chest pain with known cardiac disease and several stent placements admitted for cardiac r/out.  I don't think his CP is cardiac in etiology.  His pain lasted only minutes, and he hadn't taken his PPI, so it could be GERD symptomology.  Will continue his DAPT (Brinlinta and ASA), give IV Protonix and antacid, and proceed with r/out.  I have continued his home meds.  He is stable, full code, and will be admitted to [facility] service.  Thank you and Good day.   Other plans as per orders.  Code Status: FULL Unk Lightning, MD. [facility] Pager [phone] 7pm to 7am.  11/07/2014, 6:23 AM

## 2014-11-14 ENCOUNTER — Encounter: Payer: Medicare Other | Admitting: Adult Health

## 2015-02-04 ENCOUNTER — Other Ambulatory Visit: Payer: Self-pay | Admitting: Cardiology

## 2015-02-11 ENCOUNTER — Other Ambulatory Visit: Payer: Self-pay | Admitting: Cardiology

## 2015-02-11 NOTE — Telephone Encounter (Signed)
BRILINTA 90 MG TABS tablet Would like RX sent into [facility] 90 day supplies

## 2015-02-24 ENCOUNTER — Telehealth: Payer: Self-pay | Admitting: Cardiology

## 2015-02-24 MED ORDER — TICAGRELOR 90 MG PO TABS: 90.0000 mg | ORAL_TABLET | Freq: Two times a day (BID) | ORAL | Status: DC

## 2015-02-24 NOTE — Telephone Encounter (Signed)
Pt uses [facility] and they are wanting him to have a 90 day supply of his Brilinta instead of 30 day.

## 2015-02-24 NOTE — Telephone Encounter (Signed)
Sent in 90 days supply after making pt a fu appt.  03/11/15 @10 :40

## 2015-03-11 ENCOUNTER — Ambulatory Visit (INDEPENDENT_AMBULATORY_CARE_PROVIDER_SITE_OTHER): Payer: Medicare Other | Admitting: Cardiology

## 2015-03-11 ENCOUNTER — Encounter: Payer: Self-pay | Admitting: Cardiology

## 2015-03-11 ENCOUNTER — Encounter: Payer: Self-pay | Admitting: *Deleted

## 2015-03-11 VITALS — BP 140/80 | HR 55 | Ht 65.0 in | Wt 167.0 lb

## 2015-03-11 DIAGNOSIS — I2510 Atherosclerotic heart disease of native coronary artery without angina pectoris: Secondary | ICD-10-CM

## 2015-03-11 DIAGNOSIS — I10 Essential (primary) hypertension: Secondary | ICD-10-CM | POA: Diagnosis not present

## 2015-03-11 DIAGNOSIS — E785 Hyperlipidemia, unspecified: Secondary | ICD-10-CM | POA: Diagnosis not present

## 2015-03-11 NOTE — Progress Notes (Signed)
Cardiology Office Note  Date: 03/11/2015   ID: Keith Dalesio., DOB 1945/12/12, MRN [id number]  PCP: Kirstie Peri, MD  Primary Cardiologist: Nona Dell, MD   Chief Complaint  Patient presents with  . Coronary Artery Disease    History of Present Illness: Keith Willmarth. is a 70 y.o. male last seen in April 2016. He presents for a routine follow-up visit. Overall, continues to do well without any active angina symptoms. He was observed in the hospital back in September 2016 with an episode of chest pain, but ruled out for ACS and his ECG did not show any acute changes. He has had no subsequent symptoms.  We reviewed his medications which are outlined below. He continues on aspirin, Brilinta, Lipitor, Prinzide, Lopressor, and has nitroglycerin available. We discussed coming off of Brilinta at the end of March 2017 which would be one year out from his most recent DES.  He reports having recent lipid panel done with Dr. Sherryll Burger in November 2016, results are requested.  Enjoys walking for exercise and also playing golf.  Past Medical History  Diagnosis Date  . Essential hypertension, benign   . Thrombocytopenia (HCC)   . CAD (coronary artery disease)     a. 05/19/13: DES x 2 to RCA, staged DES to OM 05/22/13, residual LAD/diag disease for Rx mgmt  b. 05/20/14 [place] s/p DES to LAD  . GERD (gastroesophageal reflux disease)   . CKD (chronic kidney disease), stage II   . Arthritis     Current Outpatient Prescriptions  Medication Sig Dispense Refill  . aspirin EC 81 MG tablet Take 81 mg by mouth daily.    Marland Kitchen atorvastatin (LIPITOR) 40 MG tablet Take 1 tablet (40 mg total) by mouth every evening. 30 tablet 6  . Cholecalciferol (VITAMIN D-3) 1000 UNITS CAPS Take 1,000 Units by mouth daily.     Marland Kitchen lisinopril-hydrochlorothiazide (PRINZIDE,ZESTORETIC) 20-12.5 MG per tablet TAKE 1 TABLET DAILY 90 tablet 2  . metoprolol tartrate (LOPRESSOR) 25 MG tablet Take 25 mg by mouth 2 (two) times  daily.    . nitroGLYCERIN (NITROSTAT) 0.4 MG SL tablet Place 1 tablet (0.4 mg total) under the tongue every 5 (five) minutes as needed for chest pain (up to 3 doses). 25 tablet 3  . omeprazole (PRILOSEC) 40 MG capsule Take 1 capsule (40 mg total) by mouth at bedtime. 30 capsule 6  . ticagrelor (BRILINTA) 90 MG TABS tablet Take 1 tablet (90 mg total) by mouth 2 (two) times daily. 180 tablet 0  . vitamin B-12 (CYANOCOBALAMIN) 1000 MCG tablet Take 1,000 mcg by mouth daily.     No current facility-administered medications for this visit.   Allergies:  Review of patient's allergies indicates no known allergies.   Social History: The patient  reports that he quit smoking about 13 years ago. His smoking use included Cigarettes. He started smoking about 49 years ago. He has a 38 pack-year smoking history. He has never used smokeless tobacco. He reports that he drinks about 0.6 oz of alcohol per week. He reports that he does not use illicit drugs.  ROS:  Please see the history of present illness. Otherwise, complete review of systems is positive for musculoskeletal neck pain.  All other systems are reviewed and negative.   Physical Exam: VS:  BP 140/80 mmHg  Pulse 55  Ht 5\' 5"  (1.651 m)  Wt 167 lb (75.751 kg)  BMI 27.79 kg/m2  SpO2 99%, BMI Body mass index is 27.79 kg/(m^2).  Wt Readings from Last 3 Encounters:  03/11/15 167 lb (75.751 kg)  11/07/14 160 lb (72.576 kg)  06/03/14 166 lb (75.297 kg)    General: Patient appears comfortable at rest. HEENT: Conjunctiva and lids normal, oropharynx clear. Neck: Supple, no elevated JVP or carotid bruits, no thyromegaly. Lungs: Clear to auscultation, nonlabored breathing at rest. Cardiac: Regular rate and rhythm, no S3 or significant systolic murmur, no pericardial rub. Abdomen: Soft, nontender, bowel sounds present. Extremities: No pitting edema, distal pulses 2+. Radial cardiac catheterization access site stable.  ECG: Tracing from 11/07/2014 showed  sinus rhythm with nonspecific ST changes.  Recent Labwork: 05/20/2014: ALT 20; AST 27 11/07/2014: BUN 11; Creatinine, Ser 1.61*; Hemoglobin 14.9; Platelets 113*; Potassium 3.8; Sodium 138     Component Value Date/Time   CHOL 128 05/20/2013 0047   TRIG 92 05/20/2013 0047   HDL 42 05/20/2013 0047   CHOLHDL 3.0 05/20/2013 0047   VLDL 18 05/20/2013 0047   LDLCALC 68 05/20/2013 0047    Other Studies Reviewed Today:  Echocardiogram 05/20/2014: Study Conclusions  - Left ventricle: The cavity size was normal. Wall thickness was increased in a pattern of moderate LVH. Systolic function was normal. The estimated ejection fraction was in the range of 60% to 65%. Wall motion was normal; there were no regional wall motion abnormalities. Doppler parameters are consistent with abnormal left ventricular relaxation (grade 1 diastolic dysfunction). The E/e&' ratio is between 8-15, suggesting indeterminate LV filling pressure. - Aortic valve: Sclerosis without stenosis. There was no regurgitation. - Mitral valve: There was no significant regurgitation. - Left atrium: The atrium was normal in size. - Right ventricle: The cavity size was normal. Wall thickness was normal. Systolic function is reduced. - Right atrium: The atrium was normal in size.  Impressions:  - LVEF 60-65%, moderate LVH, diastolic dysfunction, indeterminate (but likely elevated LV Filling pressure), normal LA size, mildly reduced RV function by TAPSE, no significant TR.  Assessment and Plan:  1. Symptomatically stable CAD status post DES 2 to the RCA and DES to the OM in 2015, more recently DES to the LAD in March 2016. He will continue current regimen, although plan to come off of Brilinta at the end of March 2017. Continue regular exercise.  2. Hyperlipidemia, continues on Lipitor. Requesting most recent lab work from Dr. Sherryll Burger.  3. Essential hypertension, no changes made to baseline  regimen.  Current medicines were reviewed with the patient today.  Disposition: FU with me in 6 months.   Signed, Jonelle Sidle, MD, [facility] 03/11/2015 11:06 AM    [facility] [address] [place], [place] [address] Phone: [phone]; Fax: [phone]

## 2015-03-11 NOTE — Patient Instructions (Signed)
Your physician wants you to follow-up in: 6 Months with Dr.McDowell. You will receive a reminder letter in the mail two months in advance. If you don't receive a letter, please call our office to schedule the follow-up appointment.  Please continue your current supply of Brilinta until complete. Then stop taking.   If you need a refill on your cardiac medications before your next appointment, please call your pharmacy.  Thank you for choosing [facility]!

## 2015-05-24 ENCOUNTER — Other Ambulatory Visit: Payer: Self-pay | Admitting: Cardiology

## 2015-05-27 DIAGNOSIS — Z713 Dietary counseling and surveillance: Secondary | ICD-10-CM | POA: Diagnosis not present

## 2015-05-27 DIAGNOSIS — Z87891 Personal history of nicotine dependence: Secondary | ICD-10-CM | POA: Diagnosis not present

## 2015-05-27 DIAGNOSIS — K148 Other diseases of tongue: Secondary | ICD-10-CM | POA: Diagnosis not present

## 2015-05-27 DIAGNOSIS — Z6828 Body mass index (BMI) 28.0-28.9, adult: Secondary | ICD-10-CM | POA: Diagnosis not present

## 2015-05-29 ENCOUNTER — Other Ambulatory Visit: Payer: Self-pay | Admitting: Cardiology

## 2015-07-16 DIAGNOSIS — J0100 Acute maxillary sinusitis, unspecified: Secondary | ICD-10-CM | POA: Diagnosis not present

## 2015-07-16 DIAGNOSIS — Z299 Encounter for prophylactic measures, unspecified: Secondary | ICD-10-CM | POA: Diagnosis not present

## 2015-07-16 DIAGNOSIS — Z87891 Personal history of nicotine dependence: Secondary | ICD-10-CM | POA: Diagnosis not present

## 2015-07-28 DIAGNOSIS — K219 Gastro-esophageal reflux disease without esophagitis: Secondary | ICD-10-CM | POA: Diagnosis not present

## 2015-07-28 DIAGNOSIS — N183 Chronic kidney disease, stage 3 (moderate): Secondary | ICD-10-CM | POA: Diagnosis not present

## 2015-07-28 DIAGNOSIS — I10 Essential (primary) hypertension: Secondary | ICD-10-CM | POA: Diagnosis not present

## 2015-08-14 ENCOUNTER — Emergency Department (HOSPITAL_COMMUNITY): Payer: Medicare Other

## 2015-08-14 ENCOUNTER — Emergency Department (HOSPITAL_COMMUNITY)
Admission: EM | Admit: 2015-08-14 | Discharge: 2015-08-15 | Disposition: A | Discharge status: Home / Self Care | Payer: Medicare Other | Attending: Emergency Medicine | Admitting: Emergency Medicine

## 2015-08-14 ENCOUNTER — Other Ambulatory Visit: Payer: Self-pay

## 2015-08-14 DIAGNOSIS — Z87891 Personal history of nicotine dependence: Secondary | ICD-10-CM | POA: Diagnosis not present

## 2015-08-14 DIAGNOSIS — I129 Hypertensive chronic kidney disease with stage 1 through stage 4 chronic kidney disease, or unspecified chronic kidney disease: Secondary | ICD-10-CM | POA: Diagnosis not present

## 2015-08-14 DIAGNOSIS — I2510 Atherosclerotic heart disease of native coronary artery without angina pectoris: Secondary | ICD-10-CM | POA: Diagnosis not present

## 2015-08-14 DIAGNOSIS — D696 Thrombocytopenia, unspecified: Secondary | ICD-10-CM | POA: Insufficient documentation

## 2015-08-14 DIAGNOSIS — Z79899 Other long term (current) drug therapy: Secondary | ICD-10-CM | POA: Diagnosis not present

## 2015-08-14 DIAGNOSIS — M1990 Unspecified osteoarthritis, unspecified site: Secondary | ICD-10-CM | POA: Diagnosis not present

## 2015-08-14 DIAGNOSIS — R079 Chest pain, unspecified: Secondary | ICD-10-CM | POA: Insufficient documentation

## 2015-08-14 DIAGNOSIS — N182 Chronic kidney disease, stage 2 (mild): Secondary | ICD-10-CM | POA: Diagnosis not present

## 2015-08-14 MED ORDER — NITROGLYCERIN 2 % TD OINT
1.0000 [in_us] | TOPICAL_OINTMENT | Freq: Once | TRANSDERMAL | Status: AC
  Administered 2015-08-15: 1 [in_us] via TOPICAL
  Filled 2015-08-14: qty 1

## 2015-08-14 NOTE — ED Provider Notes (Signed)
CSN: [id number]     Arrival date & time 08/14/15  2251 History  By signing my name below, I, Ronney Lion, attest that this documentation has been prepared under the direction and in the presence of Devoria Albe, MD at 23:40 AM. Electronically Signed: Ronney Lion, ED Scribe. 08/14/2015. 12:19 AM.    Chief Complaint  Patient presents with  . Chest Pain   The history is provided by the patient. No language interpreter was used.    HPI Comments: Keith Keith. is a 70 y.o. male with a history of hypertension, CAD, GERD, and coronary stent placement x 2 in 2015 and 2016, brought in by ambulance, who presents to the Emergency Department complaining of an episode of recurrent, improved, dull, center chest pain that began about 11 PM tonight after patient had gotten up from sleep to use the restroom. He states he woke up primarily to use the restroom and does not feel the chest pain woke him up. He rates his pain at 5/10 at its highest today but at 110 presently, although he states he still feels "aware of it." Patient states he had taken 4 x aspirin at home, per EMS instructions. He states he had not taken any NTG. Patient states he had hit golf balls yesterday for 2 hours, which is longer than his usual 1-hour sessions of hitting balls, but otherwise notes no unusual changes in activity. Patient states he had a similar chest pain in the past and was told he had blockage at that time, although he states he has never had a MI. He reports having coronary stents placed 05/19/13 and 05/20/14, with no significant chest pain since having his stents placed last. . Patient denies a history of smoking. 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   PCP Dr Sherryll Burger Patient states he sees cardiologist Dr. Diona Browner  Past Medical History  Diagnosis Date  . Essential hypertension, benign   . Thrombocytopenia (HCC)   . CAD (coronary artery disease)     a. 05/19/13: DES x 2 to RCA,  staged DES to OM 05/22/13, residual LAD/diag disease for Rx mgmt  b. 05/20/14 [place] s/p DES to LAD  . GERD (gastroesophageal reflux disease)   . CKD (chronic kidney disease), stage II   . Arthritis    Past Surgical History  Procedure Laterality Date  . Bilateral hip replacements    . Cardiac catheterization    . Coronary stent placement  05/19/13  . Left heart catheterization with coronary angiogram N/A 05/19/2013    Procedure: LEFT HEART CATHETERIZATION WITH CORONARY ANGIOGRAM;  Surgeon: Lennette Bihari, MD;  Location[facility] C[facility] LAB;  Service: Cardiovascular;  Laterality: N/A;  . Percutaneous coronary stent intervention (pci-s) N/A 05/22/2013    Procedure: PERCUTANEOUS CORONARY STENT INTERVENTION (PCI-S);  Surgeon: Kathleene Hazel, MD;  Location: [facility] CATH LAB;  Service: Cardiovascular;  Laterality: N/A;  . Fractional flow reserve wire Right 05/22/2013    Procedure: FRACTIONAL FLOW RESERVE WIRE;  Surgeon: Kathleene Hazel, MD;  Location: [facility] CATH LAB;  Service: Cardiovascular;  Laterality: Right;  . Coronary angioplasty with stent placement  05/20/2014    mid lad   . Left heart catheterization with coronary angiogram N/A 05/20/2014    Procedure: LEFT HEART CATHETERIZATION WITH CORONARY ANGIOGRAM;  Surgeon: Kathleene Hazel, MD;  Location: [facility] CATH LAB;  Service: Cardiovascular;  Laterality: N/A;   Family History  Problem Relation Age of Onset  . Aneurysm Mother   .  Heart disease Sister   . Diabetes Sister   . Diabetes Sister    Social History  Substance Use Topics  . Smoking status: Former Smoker -- 1.00 packs/day for 38 years    Types: Cigarettes    Start date: 10/16/1965    Quit date: 02/21/2002  . Smokeless tobacco: Never Used  . Alcohol Use: 0.6 oz/week    1 Glasses of wine per week  retired Hotel manager Lives at home Lives with spouse  Review of Systems  Cardiovascular: Positive for chest pain.  All other systems reviewed and are negative.   Allergies  Review of  patient's allergies indicates no known allergies.  Home Medications   Prior to Admission medications   Medication Sig Start Date End Date Taking? Authorizing Provider  aspirin EC 81 MG tablet Take 81 mg by mouth daily.    Historical Provider, MD  atorvastatin (LIPITOR) 40 MG tablet Take 1 tablet (40 mg total) by mouth every evening. 05/23/13   Dayna N Dunn, PA-C  BRILINTA 90 MG TABS tablet TAKE 1 TABLET TWICE A DAY (NEED APPOINTMENT FOR FURTHER REFILLS) 05/25/15   Jonelle Sidle, MD  Cholecalciferol (VITAMIN D-3) 1000 UNITS CAPS Take 1,000 Units by mouth daily.     Historical Provider, MD  lisinopril-hydrochlorothiazide (PRINZIDE,ZESTORETIC) 20-12.5 MG tablet TAKE 1 TABLET DAILY 05/29/15   Antoine Poche, MD  metoprolol tartrate (LOPRESSOR) 25 MG tablet Take 25 mg by mouth 2 (two) times daily.    Historical Provider, MD  nitroGLYCERIN (NITROSTAT) 0.4 MG SL tablet Place 1 tablet (0.4 mg total) under the tongue every 5 (five) minutes as needed for chest pain (up to 3 doses). 08/15/15   Devoria Albe, MD  omeprazole (PRILOSEC) 40 MG capsule Take 1 capsule (40 mg total) by mouth at bedtime. 11/07/14   Elliot Cousin, MD  vitamin B-12 (CYANOCOBALAMIN) 1000 MCG tablet Take 1,000 mcg by mouth daily.    Historical Provider, MD   BP 103/70 mmHg  Pulse 74  Temp(Src) 98.2 F (36.8 C) (Oral)  Resp 14  Ht 5\' 5"  (1.651 m)  Wt 159 lb (72.122 kg)  BMI 26.46 kg/m2  SpO2 99%  Vital signs normal   Physical Exam  Constitutional: He is oriented to person, place, and time. He appears well-developed and well-nourished.  Non-toxic appearance. He does not appear ill. No distress.  HENT:  Head: Normocephalic and atraumatic.  Right Ear: External ear normal.  Left Ear: External ear normal.  Nose: Nose normal. No mucosal edema or rhinorrhea.  Mouth/Throat: Oropharynx is clear and moist and mucous membranes are normal. No dental abscesses or uvula swelling.  Eyes: Conjunctivae and EOM are normal. Pupils are equal,  round, and reactive to light.  Neck: Normal range of motion and full passive range of motion without pain. Neck supple.  Cardiovascular: Normal rate, regular rhythm and normal heart sounds.  Exam reveals no gallop and no friction rub.   No murmur heard. Pulmonary/Chest: Effort normal and breath sounds normal. No respiratory distress. He has no wheezes. He has no rhonchi. He has no rales. He exhibits no tenderness and no crepitus.  Abdominal: Soft. Normal appearance and bowel sounds are normal. He exhibits no distension. There is no tenderness. There is no rebound and no guarding.  Musculoskeletal: Normal range of motion. He exhibits no edema or tenderness.  Moves all extremities well.   Neurological: He is alert and oriented to person, place, and time. He has normal strength. No cranial nerve deficit.  Skin: Skin is  warm, dry and intact. No rash noted. No erythema. No pallor.  Psychiatric: He has a normal mood and affect. His speech is normal and behavior is normal. His mood appears not anxious.  Nursing note and vitals reviewed.   ED Course  Procedures (including critical care time)  Medications  nitroGLYCERIN (NITROGLYN) 2 % ointment 1 inch (1 inch Topical Given 08/15/15 0013)    DIAGNOSTIC STUDIES: Oxygen Saturation is 100% on RA, normal by my interpretation.    COORDINATION OF CARE: 11:52 PM - Discussed treatment plan with pt at bedside which includes NTG ointment and cardiac work-up. Pt verbalized understanding and agreed to plan.   Recheck at 1:45 AM patient states his pain is gone. He states he feels great. We are waiting for his delta troponin to be drawn.  Patient was discharged home with sublingual nitroglycerin. He was advised to return if he uses a sublingual nitroglycerin and it does not resolve his pain. He is to follow-up with Dr. Diona Browner early this week, however he should return to the ED if his pain gets worse.  View of patient's prior records shows he had  echocardiogram in March to that 16 showed ejection fraction 60-65%. He was admitted in September with some atypical chest pain only lasting about 5 minutes. He is followed closely by Dr. Diona Browner.  Labs Review Results for orders placed or performed during the hospital encounter of 08/14/15  Comprehensive metabolic panel  Result Value Ref Range   Sodium 135 135 - 145 mmol/L   Potassium 3.4 (L) 3.5 - 5.1 mmol/L   Chloride 102 101 - 111 mmol/L   CO2 27 22 - 32 mmol/L   Glucose, Bld 144 (H) 65 - 99 mg/dL   BUN 17 6 - 20 mg/dL   Creatinine, Ser 6.96 (H) 0.61 - 1.24 mg/dL   Calcium 9.1 8.9 - 29.5 mg/dL   Total Protein 6.3 (L) 6.5 - 8.1 g/dL   Albumin 3.6 3.5 - 5.0 g/dL   AST 24 15 - 41 U/L   ALT 23 17 - 63 U/L   Alkaline Phosphatase 57 38 - 126 U/L   Total Bilirubin 0.6 0.3 - 1.2 mg/dL   GFR calc non Af Amer 42 (L) >60 mL/min   GFR calc Af Amer 49 (L) >60 mL/min   Anion gap 6 5 - 15  CBC with Differential  Result Value Ref Range   WBC 6.1 4.0 - 10.5 K/uL   RBC 4.99 4.22 - 5.81 MIL/uL   Hemoglobin 15.0 13.0 - 17.0 g/dL   HCT 28.4 13.2 - 44.0 %   MCV 88.2 78.0 - 100.0 fL   MCH 30.1 26.0 - 34.0 pg   MCHC 34.1 30.0 - 36.0 g/dL   RDW 10.2 72.5 - 36.6 %   Platelets 84 (L) 150 - 400 K/uL   Neutrophils Relative % 56 %   Neutro Abs 3.5 1.7 - 7.7 K/uL   Lymphocytes Relative 32 %   Lymphs Abs 1.9 0.7 - 4.0 K/uL   Monocytes Relative 8 %   Monocytes Absolute 0.5 0.1 - 1.0 K/uL   Eosinophils Relative 4 %   Eosinophils Absolute 0.2 0.0 - 0.7 K/uL   Basophils Relative 1 %   Basophils Absolute 0.0 0.0 - 0.1 K/uL  Troponin I  Result Value Ref Range   Troponin I <0.03 <0.031 ng/mL  Troponin I  Result Value Ref Range   Troponin I <0.03 <0.031 ng/mL    Laboratory interpretation all normal except Stable renal insufficiency,  mild hypokalemia   Imaging Review Dg Chest Port 1 View  08/15/2015  CLINICAL DATA:  70 year old male with chest pain EXAM: PORTABLE CHEST 1 VIEW COMPARISON:  Chest  radiograph dated 11/07/2014 FINDINGS: Single portable view of the chest demonstrates mild emphysematous changes of the lungs. There is no focal consolidation, pleural effusion, or pneumothorax. The cardiac silhouette is within normal limits. No acute osseous pathology. IMPRESSION: No active disease. Electronically Signed   By: Elgie Collard M.D.   On: 08/15/2015 00:53   I have personally reviewed and evaluated these images and lab results as part of my medical decision-making.   EKG Interpretation   Date/Time:  Friday August 14 2015 23:14:18 EDT Ventricular Rate:  81 PR Interval:    QRS Duration: 95 QT Interval:  381 QTC Calculation: 443 R Axis:   71 Text Interpretation:  Sinus rhythm Abnormal inferior Q waves Minimal ST  elevation, inferior leads No significant change since last tracing 07 Nov 2014 Confirmed by [facility]  MD-I, Kalimah Capurro ([id number]) on 08/14/2015 11:51:30 PM      MDM   Final diagnoses:  Chest pain, unspecified chest pain type   Modified Medications   Modified Medication Previous Medication   NITROGLYCERIN (NITROSTAT) 0.4 MG SL TABLET nitroGLYCERIN (NITROSTAT) 0.4 MG SL tablet      Place 1 tablet (0.4 mg total) under the tongue every 5 (five) minutes as needed for chest pain (up to 3 doses).    Place 1 tablet (0.4 mg total) under the tongue every 5 (five) minutes as needed for chest pain (up to 3 doses).    Plan discharge  Devoria Albe, MD, FACEP   I personally performed the services described in this documentation, which was scribed in my presence. The recorded information has been reviewed and considered.  Devoria Albe, MD, Concha Pyo, MD 08/15/15 [phone]

## 2015-08-14 NOTE — ED Notes (Signed)
Mid sternal chest pain started 30 mins prior to ems arrival.  Pt took 325 mg aspirin at home and when ems arrived pt was pain-free.

## 2015-08-15 DIAGNOSIS — R079 Chest pain, unspecified: Secondary | ICD-10-CM | POA: Diagnosis not present

## 2015-08-15 LAB — CBC WITH DIFFERENTIAL/PLATELET
BASOS ABS: 0 10*3/uL (ref 0.0–0.1)
Basophils Relative: 1 %
Eosinophils Absolute: 0.2 10*3/uL (ref 0.0–0.7)
Eosinophils Relative: 4 %
HEMATOCRIT: 44 % (ref 39.0–52.0)
Hemoglobin: 15 g/dL (ref 13.0–17.0)
LYMPHS PCT: 32 %
Lymphs Abs: 1.9 10*3/uL (ref 0.7–4.0)
MCH: 30.1 pg (ref 26.0–34.0)
MCHC: 34.1 g/dL (ref 30.0–36.0)
MCV: 88.2 fL (ref 78.0–100.0)
MONO ABS: 0.5 10*3/uL (ref 0.1–1.0)
MONOS PCT: 8 %
NEUTROS ABS: 3.5 10*3/uL (ref 1.7–7.7)
Neutrophils Relative %: 56 %
Platelets: 84 10*3/uL — ABNORMAL LOW (ref 150–400)
RBC: 4.99 MIL/uL (ref 4.22–5.81)
RDW: 13.8 % (ref 11.5–15.5)
WBC: 6.1 10*3/uL (ref 4.0–10.5)

## 2015-08-15 LAB — TROPONIN I
Troponin I: <0.03 ng/mL (ref <0.031)
Troponin I: <0.03 ng/mL (ref <0.031)

## 2015-08-15 LAB — COMPREHENSIVE METABOLIC PANEL
ALBUMIN: 3.6 g/dL (ref 3.5–5.0)
ALT: 23 U/L (ref 17–63)
AST: 24 U/L (ref 15–41)
Alkaline Phosphatase: 57 U/L (ref 38–126)
Anion gap: 6 (ref 5–15)
BILIRUBIN TOTAL: 0.6 mg/dL (ref 0.3–1.2)
BUN: 17 mg/dL (ref 6–20)
CHLORIDE: 102 mmol/L (ref 101–111)
CO2: 27 mmol/L (ref 22–32)
Calcium: 9.1 mg/dL (ref 8.9–10.3)
Creatinine, Ser: 1.61 mg/dL — ABNORMAL HIGH (ref 0.61–1.24)
GFR calc Af Amer: 49 mL/min — ABNORMAL LOW (ref >60)
GFR calc non Af Amer: 42 mL/min — ABNORMAL LOW (ref >60)
GLUCOSE: 144 mg/dL — AB (ref 65–99)
POTASSIUM: 3.4 mmol/L — AB (ref 3.5–5.1)
Sodium: 135 mmol/L (ref 135–145)
TOTAL PROTEIN: 6.3 g/dL — AB (ref 6.5–8.1)

## 2015-08-15 MED ORDER — NITROGLYCERIN 0.4 MG SL SUBL
0.4000 mg | SUBLINGUAL_TABLET | SUBLINGUAL | Status: DC | PRN
Start: 2015-08-15 — End: 2016-01-20

## 2015-08-15 NOTE — Discharge Instructions (Signed)
Try the nitroglycerin tablets under your tongue if you get chest pain again. However if you take 3 nitroglycerin pills 5 minutes apart and you're still having chest pain you should call 911. Call Dr. Ival Bible office on Monday, June 26, to get a follow-up appointment this week.

## 2015-10-15 ENCOUNTER — Encounter: Payer: Self-pay | Admitting: *Deleted

## 2015-10-15 NOTE — Progress Notes (Signed)
Cardiology Office Note  Date: 10/16/2015   ID: Keith JourneyJesse D Worley Jr., DOB [birth year], MRN [id number]  PCP: Kirstie PeriSHAH,ASHISH, MD  Primary Cardiologist: Nona DellSamuel McDowell, MD   Chief Complaint  Patient presents with  . Coronary Artery Disease    History of Present Illness: Keith JourneyJesse D Ronk Jr. is a 70 y.o. male last seen in January. He presents for a routine office visit. Record review finds ER visit in June with chest discomfort. Troponin I levels were normal at that time. He did have minor ST elevation in the inferior leads but no reciprocal lateral changes. He is a year and a half out from his last coronary intervention which was DES to the LAD. He states that he has had no further chest pain since that episode. He has not used nitroglycerin, states that he has a fresh bottle. He did get nitroglycerin in the ER which improved his symptoms and I suspect he did have angina at that time.  We went over his medications. He states that he does not want to come off of Brilinta. He has had no bleeding problems. He continues on Lipitor, I am requesting his most recent lab work from Dr. Sherryll BurgerShah.  He continues to play golf, reports NYHA class II dyspnea and no exertional chest discomfort.  Past Medical History:  Diagnosis Date  . Arthritis   . CAD (coronary artery disease)    a. 05/19/13: DES x 2 to RCA, staged DES to OM 05/22/13, residual LAD/diag disease for Rx mgmt  b. 05/20/14 [place] s[place] DES to LAD  . CKD (chronic kidney disease), stage II   . Essential hypertension, benign   . GERD (gastroesophageal reflux disease)   . Thrombocytopenia (HCC)     Past Surgical History:  Procedure Laterality Date  . Bilateral hip replacements    . CARDIAC CATHETERIZATION    . CORONARY ANGIOPLASTY WITH STENT PLACEMENT  05/20/2014   mid lad   . CORONARY STENT PLACEMENT  05/19/13  . FRACTIONAL FLOW RESERVE WIRE Right 05/22/2013   Procedure: FRACTIONAL FLOW RESERVE WIRE;  Surgeon: Kathleene Hazelhristopher D McAlhany, MD;  [facility];  Service: Cardiovascular;  Laterality: Right;  . LEFT HEART CATHETERIZATION WITH CORONARY ANGIOGRAM N/A 05/19/2013   Procedure: LEFT HEART CATHETERIZATION WITH CORONARY ANGIOGRAM;  Surgeon: Lennette Biharihomas A Kelly, MD;  [facility];  Service: Cardiovascular;  Laterality: N/A;  . LEFT HEART CATHETERIZATION WITH CORONARY ANGIOGRAM N/A 05/20/2014   Procedure: LEFT HEART CATHETERIZATION WITH CORONARY ANGIOGRAM;  Surgeon: Kathleene Hazelhristopher D McAlhany, MD;  [facility];  Service: Cardiovascular;  Laterality: N/A;  . PERCUTANEOUS CORONARY STENT INTERVENTION (PCI-S) N/A 05/22/2013   Procedure: PERCUTANEOUS CORONARY STENT INTERVENTION (PCI-S);  Surgeon: Kathleene Hazelhristopher D McAlhany, MD;  [facility];  Service: Cardiovascular;  Laterality: N/A;    Current Outpatient Prescriptions  Medication Sig Dispense Refill  . aspirin EC 81 MG tablet Take 81 mg by mouth daily.    Marland Kitchen. atorvastatin (LIPITOR) 40 MG tablet Take 1 tablet (40 mg total) by mouth every evening. 30 tablet 6  . BRILINTA 90 MG TABS tablet TAKE 1 TABLET TWICE A DAY (NEED APPOINTMENT FOR FURTHER REFILLS) 180 tablet 1  . Cholecalciferol (VITAMIN D-3) 1000 UNITS CAPS Take 1,000 Units by mouth daily.     Marland Kitchen. lisinopril-hydrochlorothiazide (PRINZIDE,ZESTORETIC) 20-12.5 MG tablet TAKE 1 TABLET DAILY 90 tablet 1  . metoprolol tartrate (LOPRESSOR) 25 MG tablet Take 25 mg by mouth 2 (two) times daily.    . nitroGLYCERIN (NITROSTAT) 0.4  MG SL tablet Place 1 tablet (0.4 mg total) under the tongue every 5 (five) minutes as needed for chest pain (up to 3 doses). 25 tablet 3  . omeprazole (PRILOSEC) 40 MG capsule Take 1 capsule (40 mg total) by mouth at bedtime. 30 capsule 6  . vitamin B-12 (CYANOCOBALAMIN) 1000 MCG tablet Take 1,000 mcg by mouth daily.     No current facility-administered medications for this visit.    Allergies:  Review of patient's allergies indicates no known allergies.   Social History: The patient  reports that he quit  smoking about 13 years ago. His smoking use included Cigarettes. He started smoking about 50 years ago. He has a 38.00 pack-year smoking history. He has never used smokeless tobacco. He reports that he drinks about 0.6 oz of alcohol per week . He reports that he does not use drugs.   ROS:  Please see the history of present illness. Otherwise, complete review of systems is positive for none.  All other systems are reviewed and negative.   Physical Exam: VS:  BP 138/88   Pulse 62   Ht 5\' 5"  (1.651 m)   Wt 161 lb (73 kg)   SpO2 99%   BMI 26.79 kg/m , BMI Body mass index is 26.79 kg/m.  Wt Readings from Last 3 Encounters:  10/16/15 161 lb (73 kg)  08/14/15 159 lb (72.1 kg)  03/11/15 167 lb (75.8 kg)    General: Patient appears comfortable at rest. HEENT: Conjunctiva and lids normal, oropharynx clear. Neck: Supple, no elevated JVP or carotid bruits, no thyromegaly. Lungs: Clear to auscultation, nonlabored breathing at rest. Cardiac: Regular rate and rhythm, no S3 or significant systolic murmur, no pericardial rub. Abdomen: Soft, nontender, bowel sounds present. Extremities: No pitting edema, distal pulses 2+. Skin: Warm and dry. Musculoskeletal: No kyphosis. Neuropsychiatric: Alert and oriented 3, affect appropriate.  ECG: I personally reviewed the tracing from 08/14/2015 which showed sinus rhythm with probable early repolarization changes.  Recent Labwork: 08/15/2015: ALT 23; AST 24; BUN 17; Creatinine, Ser 1.61; Hemoglobin 15.0; Platelets 84; Potassium 3.4; Sodium 135     Component Value Date/Time   CHOL 128 05/20/2013 0047   TRIG 92 05/20/2013 0047   HDL 42 05/20/2013 0047   CHOLHDL 3.0 05/20/2013 0047   VLDL 18 05/20/2013 0047   LDLCALC 68 05/20/2013 0047    Other Studies Reviewed Today:  Echocardiogram 05/20/2014: Study Conclusions  - Left ventricle: The cavity size was normal. Wall thickness was increased in a pattern of moderate LVH. Systolic function  was normal. The estimated ejection fraction was in the range of 60% to 65%. Wall motion was normal; there were no regional wall motion abnormalities. Doppler parameters are consistent with abnormal left ventricular relaxation (grade 1 diastolic dysfunction). The E/e&' ratio is between 8-15, suggesting indeterminate LV filling pressure. - Aortic valve: Sclerosis without stenosis. There was no regurgitation. - Mitral valve: There was no significant regurgitation. - Left atrium: The atrium was normal in size. - Right ventricle: The cavity size was normal. Wall thickness was normal. Systolic function is reduced. - Right atrium: The atrium was normal in size.  Impressions:  - LVEF 60-65%, moderate LVH, diastolic dysfunction, indeterminate (but likely elevated LV Filling pressure), normal LA size, mildly reduced RV function by TAPSE, no significant TR.  Assessment and Plan:  1. CAD status post DES 2 to the RCA in 2015, DES to the OM and 2015, and more recently DES to the LAD in 2016. He had a  recent episode of angina in June based on description, although has been asymptomatic since that time on medical therapy. We will continue observation for now. Follow-up in 6 months with consideration for a Myoview study at that point.  2. Hyperlipidemia, continues on Lipitor. Requesting most recent lab work from Dr. Sherryll Burger.  3. Essential hypertension, no changes made to current regimen.  4. CKD stage 2-3, creatinine 1.6 in June.  Current medicines were reviewed with the patient today.  Disposition: Follow-up with me in 6 months.  Signed, Jonelle Sidle, MD, [facility] 10/16/2015 9:10 AM    [facility] [address] [place], [place], [place] [address] Phone: [phone]; Fax: [phone]

## 2015-10-16 ENCOUNTER — Encounter: Payer: Self-pay | Admitting: Cardiology

## 2015-10-16 ENCOUNTER — Encounter: Payer: Self-pay | Admitting: *Deleted

## 2015-10-16 ENCOUNTER — Ambulatory Visit (INDEPENDENT_AMBULATORY_CARE_PROVIDER_SITE_OTHER): Payer: Medicare Other | Admitting: Cardiology

## 2015-10-16 VITALS — BP 138/88 | HR 62 | Ht 65.0 in | Wt 161.0 lb

## 2015-10-16 DIAGNOSIS — N183 Chronic kidney disease, stage 3 (moderate): Secondary | ICD-10-CM

## 2015-10-16 DIAGNOSIS — I25119 Atherosclerotic heart disease of native coronary artery with unspecified angina pectoris: Secondary | ICD-10-CM

## 2015-10-16 DIAGNOSIS — I10 Essential (primary) hypertension: Secondary | ICD-10-CM

## 2015-10-16 DIAGNOSIS — E785 Hyperlipidemia, unspecified: Secondary | ICD-10-CM

## 2015-10-16 NOTE — Patient Instructions (Signed)

## 2015-11-21 ENCOUNTER — Other Ambulatory Visit: Payer: Self-pay | Admitting: Cardiology

## 2015-11-25 ENCOUNTER — Other Ambulatory Visit: Payer: Self-pay | Admitting: Cardiology

## 2016-01-20 ENCOUNTER — Other Ambulatory Visit: Payer: Self-pay | Admitting: *Deleted

## 2016-01-20 MED ORDER — NITROGLYCERIN 0.4 MG SL SUBL: 0.4000 mg | SUBLINGUAL_TABLET | SUBLINGUAL | 1 refills | Status: DC | PRN

## 2016-01-26 DIAGNOSIS — Z1211 Encounter for screening for malignant neoplasm of colon: Secondary | ICD-10-CM | POA: Diagnosis not present

## 2016-01-26 DIAGNOSIS — Z7189 Other specified counseling: Secondary | ICD-10-CM | POA: Diagnosis not present

## 2016-01-26 DIAGNOSIS — Z299 Encounter for prophylactic measures, unspecified: Secondary | ICD-10-CM | POA: Diagnosis not present

## 2016-01-26 DIAGNOSIS — Z1389 Encounter for screening for other disorder: Secondary | ICD-10-CM | POA: Diagnosis not present

## 2016-01-26 DIAGNOSIS — Z6827 Body mass index (BMI) 27.0-27.9, adult: Secondary | ICD-10-CM | POA: Diagnosis not present

## 2016-01-26 DIAGNOSIS — Z0000 Encounter for general adult medical examination without abnormal findings: Secondary | ICD-10-CM | POA: Diagnosis not present

## 2016-03-04 DIAGNOSIS — I2510 Atherosclerotic heart disease of native coronary artery without angina pectoris: Secondary | ICD-10-CM | POA: Diagnosis not present

## 2016-03-04 DIAGNOSIS — E7800 Pure hypercholesterolemia, unspecified: Secondary | ICD-10-CM | POA: Diagnosis not present

## 2016-03-04 DIAGNOSIS — I10 Essential (primary) hypertension: Secondary | ICD-10-CM | POA: Diagnosis not present

## 2016-04-28 DIAGNOSIS — N183 Chronic kidney disease, stage 3 (moderate): Secondary | ICD-10-CM | POA: Diagnosis not present

## 2016-04-28 DIAGNOSIS — H6123 Impacted cerumen, bilateral: Secondary | ICD-10-CM | POA: Diagnosis not present

## 2016-04-28 DIAGNOSIS — I10 Essential (primary) hypertension: Secondary | ICD-10-CM | POA: Diagnosis not present

## 2016-04-28 DIAGNOSIS — Z6827 Body mass index (BMI) 27.0-27.9, adult: Secondary | ICD-10-CM | POA: Diagnosis not present

## 2016-04-28 DIAGNOSIS — Z713 Dietary counseling and surveillance: Secondary | ICD-10-CM | POA: Diagnosis not present

## 2016-04-28 DIAGNOSIS — Z299 Encounter for prophylactic measures, unspecified: Secondary | ICD-10-CM | POA: Diagnosis not present

## 2016-04-28 DIAGNOSIS — Z87891 Personal history of nicotine dependence: Secondary | ICD-10-CM | POA: Diagnosis not present

## 2016-05-22 HISTORY — PX: COLONOSCOPY W/ POLYPECTOMY: SHX1380

## 2016-06-06 IMAGING — DX DG CHEST 2V
2 series · 2 of 2 positions shown · non-contrast
Comparison: Radiograph dated 05/20/2014

CLINICAL DATA: 69-year-old male with chest pain

EXAM:
CHEST  2 VIEW

[chest pa]
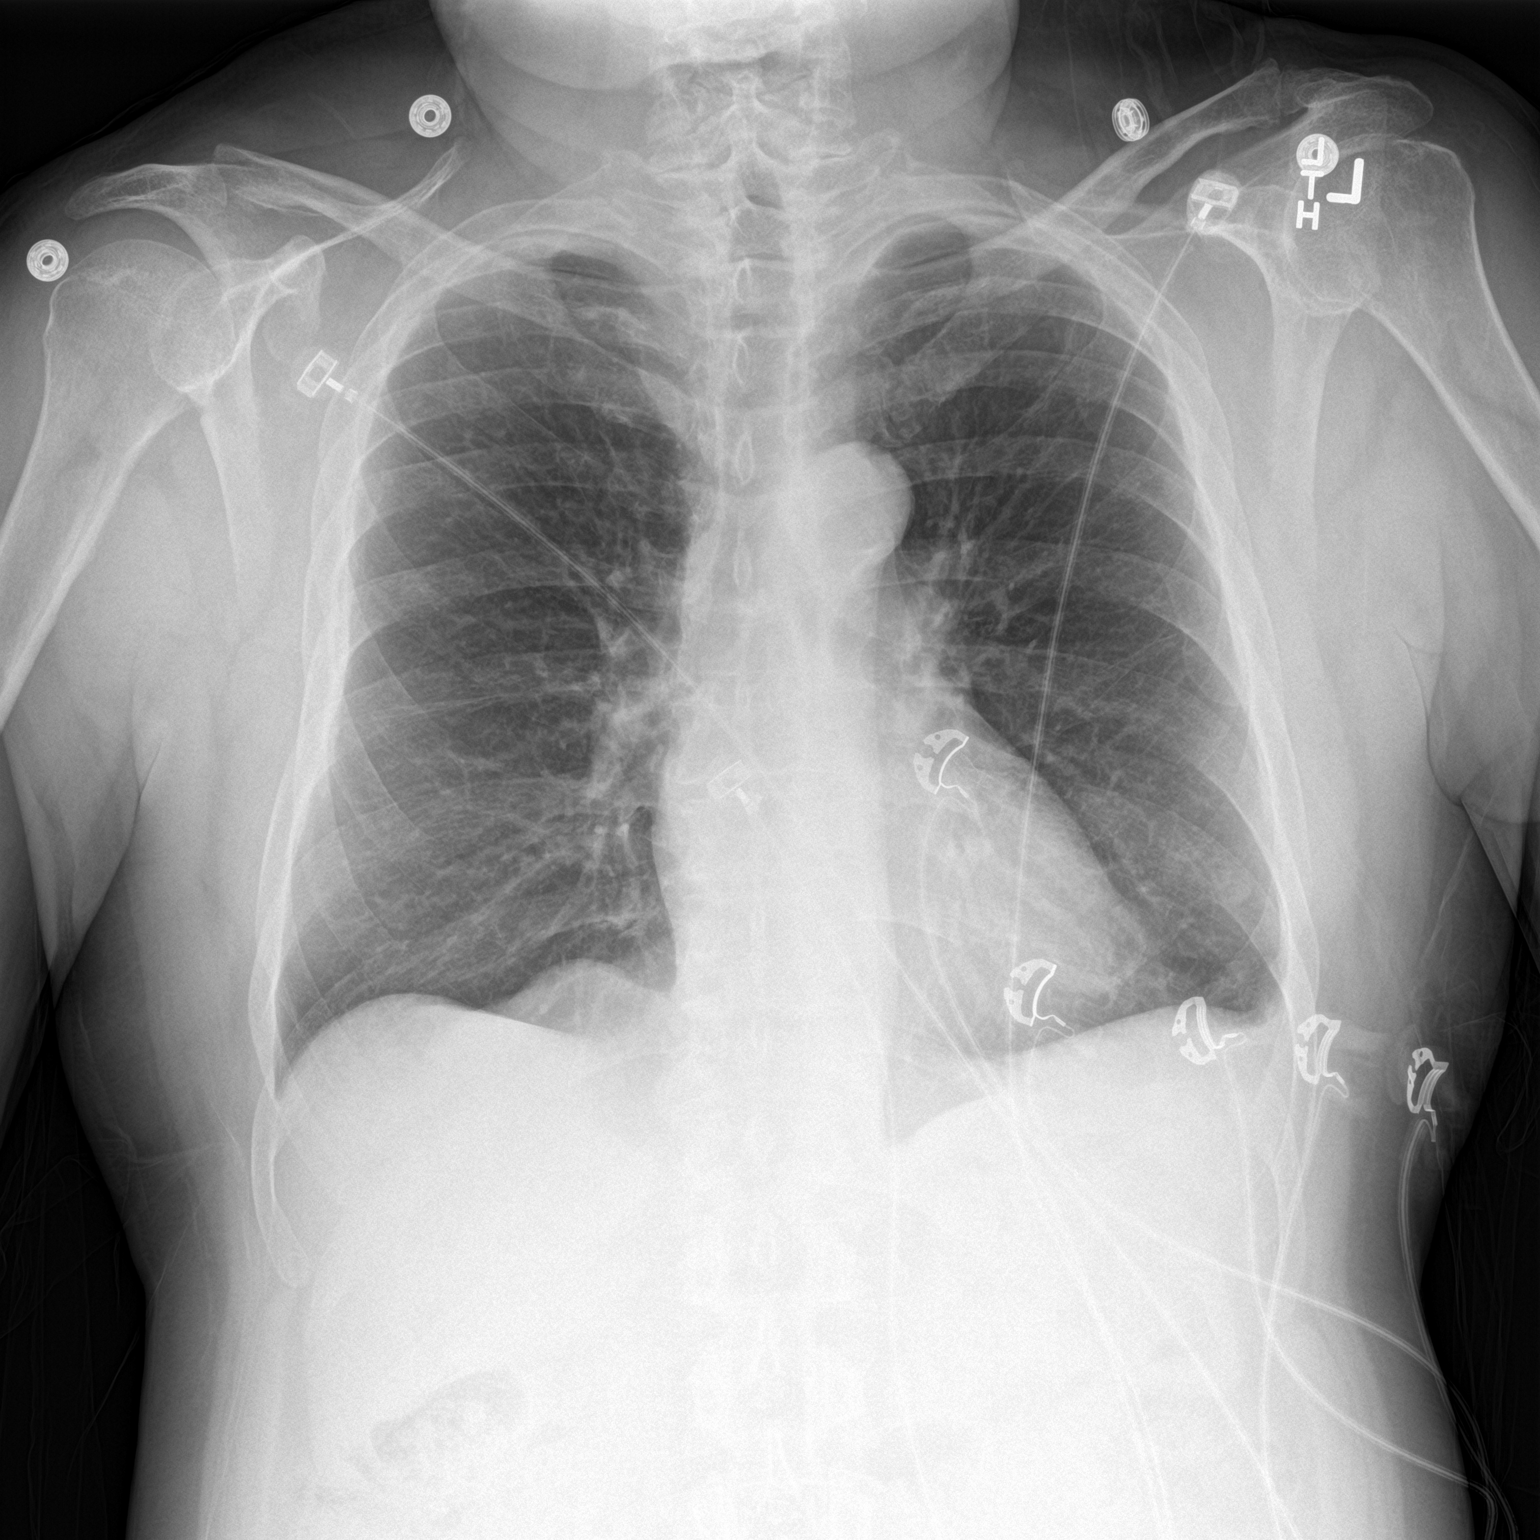

[chest lat]
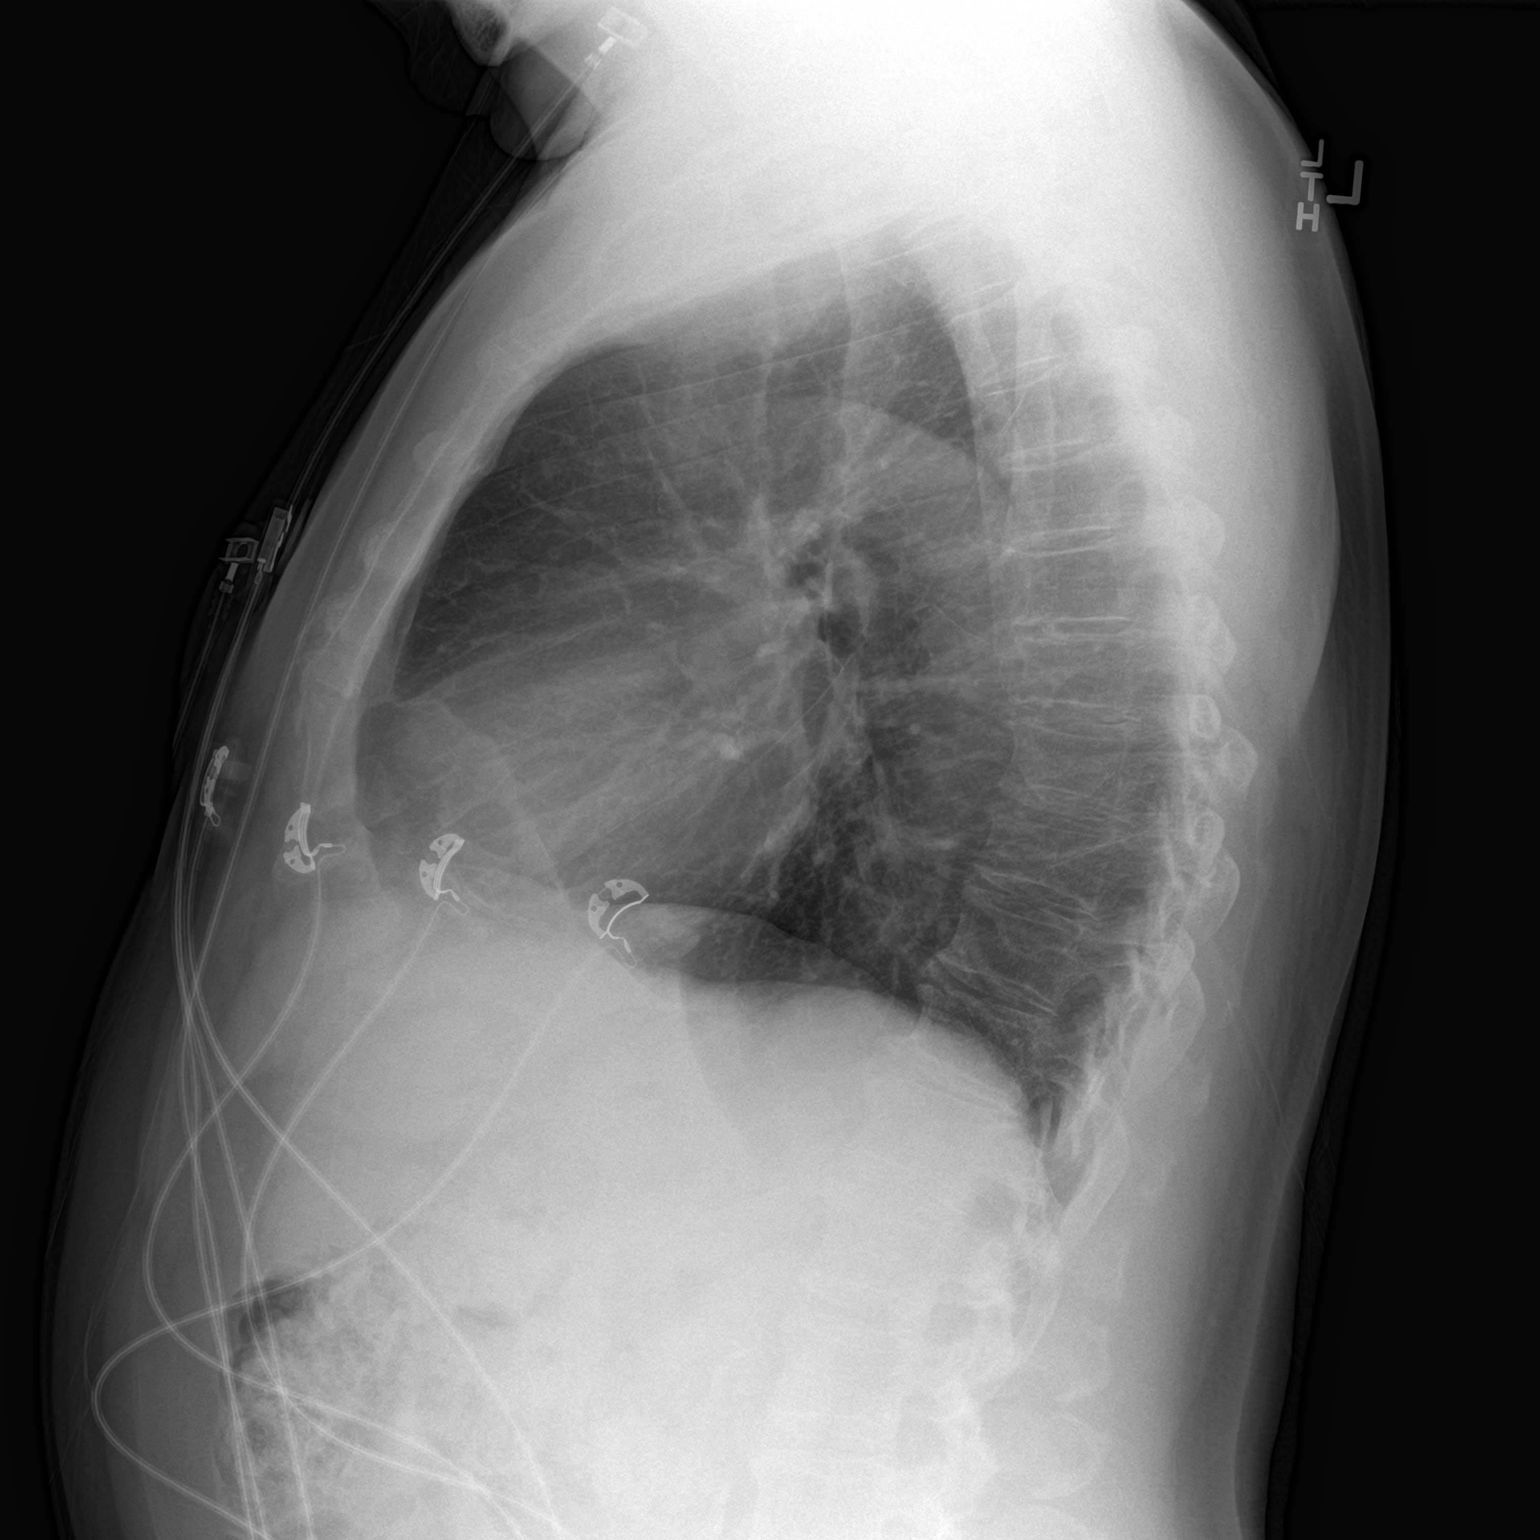

[2 of 2 positions shown; findings below may reference images not displayed]

FINDINGS: The heart size and mediastinal contours are within normal limits.
Both lungs are clear. The visualized skeletal structures are
unremarkable.
IMPRESSION: No active cardiopulmonary disease.

## 2016-06-08 ENCOUNTER — Other Ambulatory Visit: Payer: Self-pay | Admitting: Cardiology

## 2016-06-28 ENCOUNTER — Other Ambulatory Visit: Payer: Self-pay | Admitting: Cardiology

## 2016-08-25 ENCOUNTER — Other Ambulatory Visit: Payer: Self-pay | Admitting: Cardiology

## 2016-09-08 DIAGNOSIS — N183 Chronic kidney disease, stage 3 (moderate): Secondary | ICD-10-CM | POA: Diagnosis not present

## 2016-09-08 DIAGNOSIS — I10 Essential (primary) hypertension: Secondary | ICD-10-CM | POA: Diagnosis not present

## 2016-09-08 DIAGNOSIS — Z299 Encounter for prophylactic measures, unspecified: Secondary | ICD-10-CM | POA: Diagnosis not present

## 2016-09-08 DIAGNOSIS — K219 Gastro-esophageal reflux disease without esophagitis: Secondary | ICD-10-CM | POA: Diagnosis not present

## 2016-09-08 DIAGNOSIS — Z6826 Body mass index (BMI) 26.0-26.9, adult: Secondary | ICD-10-CM | POA: Diagnosis not present

## 2016-09-08 DIAGNOSIS — R109 Unspecified abdominal pain: Secondary | ICD-10-CM | POA: Diagnosis not present

## 2016-09-09 DIAGNOSIS — R109 Unspecified abdominal pain: Secondary | ICD-10-CM | POA: Diagnosis not present

## 2016-09-09 DIAGNOSIS — I2510 Atherosclerotic heart disease of native coronary artery without angina pectoris: Secondary | ICD-10-CM | POA: Diagnosis not present

## 2016-09-09 DIAGNOSIS — I700 Atherosclerosis of aorta: Secondary | ICD-10-CM | POA: Diagnosis not present

## 2016-09-09 DIAGNOSIS — K858 Other acute pancreatitis without necrosis or infection: Secondary | ICD-10-CM | POA: Diagnosis not present

## 2016-09-19 DIAGNOSIS — R1010 Upper abdominal pain, unspecified: Secondary | ICD-10-CM | POA: Diagnosis not present

## 2016-09-19 DIAGNOSIS — R93421 Abnormal radiologic findings on diagnostic imaging of right kidney: Secondary | ICD-10-CM | POA: Diagnosis not present

## 2016-10-06 NOTE — Progress Notes (Deleted)
Cardiology Office Note  Date: 10/06/2016   ID: Keith Huezo., DOB 1946-01-24, MRN [id number]  PCP: Kirstie Peri, MD  Primary Cardiologist: Nona Dell, MD   No chief complaint on file.   History of Present Illness: Keith Brayman. is a 71 y.o. male last seen in August 2017. He presents overdue for a follow-up visit.  Past Medical History:  Diagnosis Date  . Arthritis   . CAD (coronary artery disease)    a. 05/19/13: DES x 2 to RCA, staged DES to OM 05/22/13, residual LAD/diag disease for Rx mgmt  b. 05/20/14 [place] s/p DES to LAD  . CKD (chronic kidney disease), stage II   . Essential hypertension, benign   . GERD (gastroesophageal reflux disease)   . Thrombocytopenia (HCC)     Past Surgical History:  Procedure Laterality Date  . Bilateral hip replacements    . CARDIAC CATHETERIZATION    . CORONARY ANGIOPLASTY WITH STENT PLACEMENT  05/20/2014   mid lad   . CORONARY STENT PLACEMENT  05/19/13  . FRACTIONAL FLOW RESERVE WIRE Right 05/22/2013   Procedure: FRACTIONAL FLOW RESERVE WIRE;  Surgeon: Kathleene Hazel, MD;  Location: [facility] CATH LAB;  Service: Cardiovascular;  Laterality: Right;  . LEFT HEART CATHETERIZATION WITH CORONARY ANGIOGRAM N/A 05/19/2013   Procedure: LEFT HEART CATHETERIZATION WITH CORONARY ANGIOGRAM;  Surgeon: Lennette Bihari, MD;  Location: [facility] CATH LAB;  Service: Cardiovascular;  Laterality: N/A;  . LEFT HEART CATHETERIZATION WITH CORONARY ANGIOGRAM N/A 05/20/2014   Procedure: LEFT HEART CATHETERIZATION WITH CORONARY ANGIOGRAM;  Surgeon: Kathleene Hazel, MD;  Location: [facility] CATH LAB;  Service: Cardiovascular;  Laterality: N/A;  . PERCUTANEOUS CORONARY STENT INTERVENTION (PCI-S) N/A 05/22/2013   Procedure: PERCUTANEOUS CORONARY STENT INTERVENTION (PCI-S);  Surgeon: Kathleene Hazel, MD;  Location: [facility] CATH LAB;  Service: Cardiovascular;  Laterality: N/A;    Current Outpatient Prescriptions  Medication Sig Dispense Refill  . aspirin EC 81 MG  tablet Take 81 mg by mouth daily.    Marland Kitchen atorvastatin (LIPITOR) 40 MG tablet Take 1 tablet (40 mg total) by mouth every evening. 30 tablet 6  . BRILINTA 90 MG TABS tablet TAKE 1 TABLET TWICE A DAY (NEED APPOINTMENT FOR FURTHER REFILLS, OVERDUE FOR FOLLOW UP, WAS DUE THIS FEBRUARY) 60 tablet 0  . Cholecalciferol (VITAMIN D-3) 1000 UNITS CAPS Take 1,000 Units by mouth daily.     Marland Kitchen lisinopril-hydrochlorothiazide (PRINZIDE,ZESTORETIC) 20-12.5 MG tablet TAKE 1 TABLET DAILY 90 tablet 1  . metoprolol tartrate (LOPRESSOR) 25 MG tablet Take 25 mg by mouth 2 (two) times daily.    . nitroGLYCERIN (NITROSTAT) 0.4 MG SL tablet Place 1 tablet (0.4 mg total) under the tongue every 5 (five) minutes as needed for chest pain (up to 3 doses). 75 tablet 1  . omeprazole (PRILOSEC) 40 MG capsule Take 1 capsule (40 mg total) by mouth at bedtime. 30 capsule 6  . vitamin B-12 (CYANOCOBALAMIN) 1000 MCG tablet Take 1,000 mcg by mouth daily.     No current facility-administered medications for this visit.    Allergies:  Patient has no known allergies.   Social History: The patient  reports that he quit smoking about 14 years ago. His smoking use included Cigarettes. He started smoking about 51 years ago. He has a 38.00 pack-year smoking history. He has never used smokeless tobacco. He reports that he drinks about 0.6 oz of alcohol per week . He reports that he does not use drugs.   Family History: The  patient's family history includes Aneurysm in his mother; Diabetes in his sister and sister; Heart disease in his sister.   ROS:  Please see the history of present illness. Otherwise, complete review of systems is positive for {NONE DEFAULTED:18576::"none"}.  All other systems are reviewed and negative.   Physical Exam: VS:  There were no vitals taken for this visit., BMI There is no height or weight on file to calculate BMI.  Wt Readings from Last 3 Encounters:  10/16/15 161 lb (73 kg)  08/14/15 159 lb (72.1 kg)  03/11/15  167 lb (75.8 kg)    General: Patient appears comfortable at rest. HEENT: Conjunctiva and lids normal, oropharynx clear. Neck: Supple, no elevated JVP or carotid bruits, no thyromegaly. Lungs: Clear to auscultation, nonlabored breathing at rest. Cardiac: Regular rate and rhythm, no S3 or significant systolic murmur, no pericardial rub. Abdomen: Soft, nontender, bowel sounds present. Extremities: No pitting edema, distal pulses 2+. Skin: Warm and dry. Musculoskeletal: No kyphosis. Neuropsychiatric: Alert and oriented 3, affect appropriate.  ECG: I personally reviewed the tracing from 08/14/2015 which showed sinus rhythm with probable early repolarization changes.  Recent Labwork:  08/15/2015: ALT 23; AST 24; BUN 17; Creatinine, Ser 1.61; Hemoglobin 15.0; Platelets 84; Potassium 3.4; Sodium 135   Other Studies Reviewed Today:  Echocardiogram 05/20/2014: Study Conclusions  - Left ventricle: The cavity size was normal. Wall thickness was increased in a pattern of moderate LVH. Systolic function was normal. The estimated ejection fraction was in the range of 60% to 65%. Wall motion was normal; there were no regional wall motion abnormalities. Doppler parameters are consistent with abnormal left ventricular relaxation (grade 1 diastolic dysfunction). The E/e&' ratio is between 8-15, suggesting indeterminate LV filling pressure. - Aortic valve: Sclerosis without stenosis. There was no regurgitation. - Mitral valve: There was no significant regurgitation. - Left atrium: The atrium was normal in size. - Right ventricle: The cavity size was normal. Wall thickness was normal. Systolic function is reduced. - Right atrium: The atrium was normal in size.  Impressions:  - LVEF 60-65%, moderate LVH, diastolic dysfunction, indeterminate (but likely elevated LV Filling pressure), normal LA size, mildly reduced RV function by TAPSE, no significant TR.  Assessment and  Plan:    Current medicines were reviewed with the patient today.  No orders of the defined types were placed in this encounter.   Disposition:  Signed, Jonelle Sidle, MD, [facility] 10/06/2016 10:23 AM    [facility] at [facility] [address] [place], [place], [place] [address] Phone: [phone]; Fax: [phone]

## 2016-10-07 ENCOUNTER — Ambulatory Visit: Payer: Medicare Other | Admitting: Cardiology

## 2016-10-07 NOTE — Progress Notes (Signed)
Cardiology Office Note  Date: 10/10/2016   ID: Keith Staudacher., DOB [birth year], MRN [id number]  PCP: Kirstie Peri, MD  Primary Cardiologist: Nona Dell, MD   Chief Complaint  Patient presents with  . Coronary Artery Disease    History of Present Illness: Keith Maneval. is a 71 y.o. male last seen in August 2017. He presents overdue for a follow-up visit. States that he has been feeling well, no progressive angina symptoms on medical therapy. He plays golf regularly, we also discussed a walking plan for exercise.  I reviewed his medications which are stable as outlined below. He continues on aspirin, Lipitor, lisinopril HCTZ, Lopressor, Brilinta, and has as needed nitroglycerin available. We have continued DAPT with history of multiple DES interventions.  I personally reviewed his ECG today which shows sinus bradycardia.  We discussed scheduling a Lexiscan Myoview prior to his next visit for ischemic follow-up, last PCI being in 2016.  Past Medical History:  Diagnosis Date  . Arthritis   . CAD (coronary artery disease)    a. 05/19/13: DES x 2 to RCA, staged DES to OM 05/22/13, residual LAD/diag disease for Rx mgmt  b. 05/20/14 [place] s/p DES to LAD  . CKD (chronic kidney disease), stage II   . Essential hypertension, benign   . GERD (gastroesophageal reflux disease)   . Thrombocytopenia (HCC)     Past Surgical History:  Procedure Laterality Date  . Bilateral hip replacements    . CARDIAC CATHETERIZATION    . CORONARY ANGIOPLASTY WITH STENT PLACEMENT  05/20/2014   mid lad   . CORONARY STENT PLACEMENT  05/19/13  . FRACTIONAL FLOW RESERVE WIRE Right 05/22/2013   Procedure: FRACTIONAL FLOW RESERVE WIRE;  Surgeon: Kathleene Hazel, MD;  Location: [facility] CATH LAB;  Service: Cardiovascular;  Laterality: Right;  . LEFT HEART CATHETERIZATION WITH CORONARY ANGIOGRAM N/A 05/19/2013   Procedure: LEFT HEART CATHETERIZATION WITH CORONARY ANGIOGRAM;  Surgeon: Lennette Bihari, MD;   Location: [facility] CATH LAB;  Service: Cardiovascular;  Laterality: N/A;  . LEFT HEART CATHETERIZATION WITH CORONARY ANGIOGRAM N/A 05/20/2014   Procedure: LEFT HEART CATHETERIZATION WITH CORONARY ANGIOGRAM;  Surgeon: Kathleene Hazel, MD;  Location: [facility] CATH LAB;  Service: Cardiovascular;  Laterality: N/A;  . PERCUTANEOUS CORONARY STENT INTERVENTION (PCI-S) N/A 05/22/2013   Procedure: PERCUTANEOUS CORONARY STENT INTERVENTION (PCI-S);  Surgeon: Kathleene Hazel, MD;  Location: [facility] CATH LAB;  Service: Cardiovascular;  Laterality: N/A;    Current Outpatient Prescriptions  Medication Sig Dispense Refill  . aspirin EC 81 MG tablet Take 81 mg by mouth daily.    Marland Kitchen atorvastatin (LIPITOR) 40 MG tablet Take 1 tablet (40 mg total) by mouth every evening. 30 tablet 6  . Cholecalciferol (VITAMIN D-3) 1000 UNITS CAPS Take 1,000 Units by mouth daily.     Marland Kitchen lisinopril-hydrochlorothiazide (PRINZIDE,ZESTORETIC) 20-12.5 MG tablet TAKE 1 TABLET DAILY 90 tablet 1  . metoprolol tartrate (LOPRESSOR) 25 MG tablet Take 25 mg by mouth 2 (two) times daily.    . nitroGLYCERIN (NITROSTAT) 0.4 MG SL tablet Place 1 tablet (0.4 mg total) under the tongue every 5 (five) minutes as needed for chest pain (up to 3 doses). 75 tablet 1  . omeprazole (PRILOSEC) 40 MG capsule Take 1 capsule (40 mg total) by mouth at bedtime. 30 capsule 6  . ticagrelor (BRILINTA) 90 MG TABS tablet TAKE 1 TABLET TWICE A DAY (NEED APPOINTMENT FOR FURTHER REFILLS, OVERDUE FOR FOLLOW UP, WAS DUE THIS FEBRUARY) 180 tablet 1  .  vitamin B-12 (CYANOCOBALAMIN) 1000 MCG tablet Take 1,000 mcg by mouth daily.     No current facility-administered medications for this visit.    Allergies:  Patient has no known allergies.   Social History: The patient  reports that he quit smoking about 14 years ago. His smoking use included Cigarettes. He started smoking about 51 years ago. He has a 38.00 pack-year smoking history. He has never used smokeless tobacco. He reports  that he drinks about 0.6 oz of alcohol per week . He reports that he does not use drugs.   ROS:  Please see the history of present illness. Otherwise, complete review of systems is positive for none.  All other systems are reviewed and negative.   Physical Exam: VS:  BP (!) 142/80   Pulse (!) 57   Ht 5\' 5"  (1.651 m)   Wt 159 lb (72.1 kg)   SpO2 98%   BMI 26.46 kg/m , BMI Body mass index is 26.46 kg/m.  Wt Readings from Last 3 Encounters:  10/10/16 159 lb (72.1 kg)  10/16/15 161 lb (73 kg)  08/14/15 159 lb (72.1 kg)    General: Patient appears comfortable at rest. HEENT: Conjunctiva and lids normal, oropharynx clear. Neck: Supple, no elevated JVP or carotid bruits, no thyromegaly. Lungs: Clear to auscultation, nonlabored breathing at rest. Cardiac: Regular rate and rhythm, no S3 or significant systolic murmur, no pericardial rub. Abdomen: Soft, nontender, bowel sounds present. Extremities: No pitting edema, distal pulses 2+. Skin: Warm and dry. Musculoskeletal: No kyphosis. Neuropsychiatric: Alert and oriented 3, affect appropriate.  ECG: I personally reviewed the tracing from  08/14/2015 which showed sinus rhythm with probable early repolarization changes.  Recent Labwork:    Component Value Date/Time   CHOL 128 05/20/2013 0047   TRIG 92 05/20/2013 0047   HDL 42 05/20/2013 0047   CHOLHDL 3.0 05/20/2013 0047   VLDL 18 05/20/2013 0047   LDLCALC 68 05/20/2013 0047  08/15/2015: ALT 23; AST 24; BUN 17; Creatinine, Ser 1.61; Hemoglobin 15.0; Platelets 84; Potassium 3.4; Sodium 135   Other Studies Reviewed Today:  Echocardiogram 05/20/2014: Study Conclusions  - Left ventricle: The cavity size was normal. Wall thickness was increased in a pattern of moderate LVH. Systolic function was normal. The estimated ejection fraction was in the range of 60% to 65%. Wall motion was normal; there were no regional wall motion abnormalities. Doppler parameters are consistent  with abnormal left ventricular relaxation (grade 1 diastolic dysfunction). The E/e&' ratio is between 8-15, suggesting indeterminate LV filling pressure. - Aortic valve: Sclerosis without stenosis. There was no regurgitation. - Mitral valve: There was no significant regurgitation. - Left atrium: The atrium was normal in size. - Right ventricle: The cavity size was normal. Wall thickness was normal. Systolic function is reduced. - Right atrium: The atrium was normal in size.  Impressions:  - LVEF 60-65%, moderate LVH, diastolic dysfunction, indeterminate (but likely elevated LV Filling pressure), normal LA size, mildly reduced RV function by TAPSE, no significant TR.  Assessment and Plan:  1. CAD status post DES 2 to the RCA in 2015, DES to the obtuse marginal in 2015, and DES to the LAD in 2016. Plan is to continue medical therapy, discussed a walking plan for exercise. We will obtain a follow-up Lexiscan Myoview prior to his next visit in 6 months for ischemic surveillance.  2. Hyperlipidemia, continues on Lipitor with follow-up per Dr. Sherryll Burger.  3. Essential hypertension, continue current medications. Discussed walking plan and salt restriction.  4. CKD stage III, creatinine 1.6.  Current medicines were reviewed with the patient today.   Orders Placed This Encounter  Procedures  . NM Myocar Multi W/Spect W/Wall Motion / EF  . EKG 12-Lead    Disposition: Follow-up in 6 months.  Signed, Jonelle Sidle, MD, [facility] 10/10/2016 12:13 PM    [facility] at [facility] [address] [place], [place], [place] [address] Phone: [phone]; Fax: [phone]

## 2016-10-10 ENCOUNTER — Encounter: Payer: Self-pay | Admitting: Cardiology

## 2016-10-10 ENCOUNTER — Ambulatory Visit (INDEPENDENT_AMBULATORY_CARE_PROVIDER_SITE_OTHER): Payer: Medicare Other | Admitting: Cardiology

## 2016-10-10 ENCOUNTER — Encounter: Payer: Self-pay | Admitting: *Deleted

## 2016-10-10 ENCOUNTER — Telehealth: Payer: Self-pay | Admitting: Cardiology

## 2016-10-10 VITALS — BP 142/80 | HR 57 | Ht 65.0 in | Wt 159.0 lb

## 2016-10-10 DIAGNOSIS — N183 Chronic kidney disease, stage 3 unspecified: Secondary | ICD-10-CM

## 2016-10-10 DIAGNOSIS — I25119 Atherosclerotic heart disease of native coronary artery with unspecified angina pectoris: Secondary | ICD-10-CM

## 2016-10-10 DIAGNOSIS — I209 Angina pectoris, unspecified: Secondary | ICD-10-CM | POA: Diagnosis not present

## 2016-10-10 DIAGNOSIS — I10 Essential (primary) hypertension: Secondary | ICD-10-CM

## 2016-10-10 DIAGNOSIS — E782 Mixed hyperlipidemia: Secondary | ICD-10-CM | POA: Diagnosis not present

## 2016-10-10 MED ORDER — TICAGRELOR 90 MG PO TABS: ORAL_TABLET | ORAL | 1 refills | Status: DC

## 2016-10-10 NOTE — Telephone Encounter (Signed)
Lexiscan scheduled at [facility] On Mar 27, 2017

## 2016-10-10 NOTE — Patient Instructions (Signed)
Your physician wants you to follow-up in: 6 MONTHS WITH DR MCDOWELL You will receive a reminder letter in the mail two months in advance. If you don't receive a letter, please call our office to schedule the follow-up appointment.  Your physician recommends that you continue on your current medications as directed. Please refer to the Current Medication list given to you today.  Your physician has requested that you have a lexiscan myoview JUST PRIOR TO YOUR NEXT APPOINTMENT For further information please visit [email or web address] Please follow instruction sheet, as given.  Thank you for choosing [facility]!!

## 2016-11-09 DIAGNOSIS — E7800 Pure hypercholesterolemia, unspecified: Secondary | ICD-10-CM | POA: Diagnosis not present

## 2016-11-09 DIAGNOSIS — I2510 Atherosclerotic heart disease of native coronary artery without angina pectoris: Secondary | ICD-10-CM | POA: Diagnosis not present

## 2016-11-09 DIAGNOSIS — Z6826 Body mass index (BMI) 26.0-26.9, adult: Secondary | ICD-10-CM | POA: Diagnosis not present

## 2016-11-09 DIAGNOSIS — I10 Essential (primary) hypertension: Secondary | ICD-10-CM | POA: Diagnosis not present

## 2016-11-09 DIAGNOSIS — N183 Chronic kidney disease, stage 3 (moderate): Secondary | ICD-10-CM | POA: Diagnosis not present

## 2016-11-09 DIAGNOSIS — Z299 Encounter for prophylactic measures, unspecified: Secondary | ICD-10-CM | POA: Diagnosis not present

## 2016-11-09 DIAGNOSIS — R1084 Generalized abdominal pain: Secondary | ICD-10-CM | POA: Diagnosis not present

## 2016-11-09 DIAGNOSIS — N400 Enlarged prostate without lower urinary tract symptoms: Secondary | ICD-10-CM | POA: Diagnosis not present

## 2016-11-09 DIAGNOSIS — Z713 Dietary counseling and surveillance: Secondary | ICD-10-CM | POA: Diagnosis not present

## 2016-11-28 ENCOUNTER — Encounter: Payer: Self-pay | Admitting: Gastroenterology

## 2016-12-01 ENCOUNTER — Emergency Department (HOSPITAL_COMMUNITY): Payer: Medicare Other

## 2016-12-01 ENCOUNTER — Encounter (HOSPITAL_COMMUNITY): Payer: Self-pay

## 2016-12-01 ENCOUNTER — Emergency Department (HOSPITAL_COMMUNITY)
Admission: EM | Admit: 2016-12-01 | Discharge: 2016-12-01 | Disposition: A | Discharge status: Home / Self Care | Payer: Medicare Other | Attending: Emergency Medicine | Admitting: Emergency Medicine

## 2016-12-01 DIAGNOSIS — R110 Nausea: Secondary | ICD-10-CM | POA: Insufficient documentation

## 2016-12-01 DIAGNOSIS — N189 Chronic kidney disease, unspecified: Secondary | ICD-10-CM | POA: Insufficient documentation

## 2016-12-01 DIAGNOSIS — Z79899 Other long term (current) drug therapy: Secondary | ICD-10-CM | POA: Insufficient documentation

## 2016-12-01 DIAGNOSIS — Z7982 Long term (current) use of aspirin: Secondary | ICD-10-CM | POA: Diagnosis not present

## 2016-12-01 DIAGNOSIS — I129 Hypertensive chronic kidney disease with stage 1 through stage 4 chronic kidney disease, or unspecified chronic kidney disease: Secondary | ICD-10-CM | POA: Insufficient documentation

## 2016-12-01 DIAGNOSIS — K219 Gastro-esophageal reflux disease without esophagitis: Secondary | ICD-10-CM | POA: Diagnosis not present

## 2016-12-01 DIAGNOSIS — Z87891 Personal history of nicotine dependence: Secondary | ICD-10-CM | POA: Diagnosis not present

## 2016-12-01 DIAGNOSIS — I2510 Atherosclerotic heart disease of native coronary artery without angina pectoris: Secondary | ICD-10-CM | POA: Diagnosis not present

## 2016-12-01 DIAGNOSIS — N182 Chronic kidney disease, stage 2 (mild): Secondary | ICD-10-CM | POA: Insufficient documentation

## 2016-12-01 DIAGNOSIS — R079 Chest pain, unspecified: Secondary | ICD-10-CM | POA: Diagnosis not present

## 2016-12-01 DIAGNOSIS — R0789 Other chest pain: Secondary | ICD-10-CM | POA: Diagnosis not present

## 2016-12-01 DIAGNOSIS — Z955 Presence of coronary angioplasty implant and graft: Secondary | ICD-10-CM | POA: Insufficient documentation

## 2016-12-01 LAB — CBC WITH DIFFERENTIAL/PLATELET
BASOS ABS: 0 10*3/uL (ref 0.0–0.1)
Basophils Relative: 1 %
EOS ABS: 0.3 10*3/uL (ref 0.0–0.7)
Eosinophils Relative: 6 %
HEMATOCRIT: 43.9 % (ref 39.0–52.0)
HEMOGLOBIN: 14.7 g/dL (ref 13.0–17.0)
Lymphocytes Relative: 44 %
Lymphs Abs: 2.6 10*3/uL (ref 0.7–4.0)
MCH: 29.8 pg (ref 26.0–34.0)
MCHC: 33.5 g/dL (ref 30.0–36.0)
MCV: 88.9 fL (ref 78.0–100.0)
MONOS PCT: 6 %
Monocytes Absolute: 0.4 10*3/uL (ref 0.1–1.0)
NEUTROS ABS: 2.6 10*3/uL (ref 1.7–7.7)
NEUTROS PCT: 44 %
Platelets: 97 10*3/uL — ABNORMAL LOW (ref 150–400)
RBC: 4.94 MIL/uL (ref 4.22–5.81)
RDW: 13.5 % (ref 11.5–15.5)
WBC: 5.8 10*3/uL (ref 4.0–10.5)

## 2016-12-01 LAB — BASIC METABOLIC PANEL
ANION GAP: 10 (ref 5–15)
BUN: 13 mg/dL (ref 6–20)
CHLORIDE: 100 mmol/L — AB (ref 101–111)
CO2: 28 mmol/L (ref 22–32)
CREATININE: 1.57 mg/dL — AB (ref 0.61–1.24)
Calcium: 9.6 mg/dL (ref 8.9–10.3)
GFR calc Af Amer: 50 mL/min — ABNORMAL LOW (ref >60)
GFR calc non Af Amer: 43 mL/min — ABNORMAL LOW (ref >60)
Glucose, Bld: 153 mg/dL — ABNORMAL HIGH (ref 65–99)
Potassium: 3.4 mmol/L — ABNORMAL LOW (ref 3.5–5.1)
SODIUM: 138 mmol/L (ref 135–145)

## 2016-12-01 LAB — TROPONIN I: Troponin I: <0.03 ng/mL (ref <0.03)

## 2016-12-01 MED ORDER — ASPIRIN 81 MG PO CHEW
324.0000 mg | CHEWABLE_TABLET | Freq: Once | ORAL | Status: AC
  Administered 2016-12-01: 324 mg via ORAL

## 2016-12-01 MED ORDER — ASPIRIN 81 MG PO CHEW
CHEWABLE_TABLET | ORAL | Status: AC
  Filled 2016-12-01: qty 4

## 2016-12-01 NOTE — ED Triage Notes (Signed)
Pt reports intermittent mid sternal chest pain x 2 days, states is pain free at this time.

## 2016-12-01 NOTE — ED Provider Notes (Signed)
[facility]-EMERGENCY DEPT Provider Note   CSN: [id number] Arrival date & time: 12/01/16  0035     History   Chief Complaint Chief Complaint  Patient presents with  . Chest Pain    HPI Keith Keith. is a 71 y.o. male.  HPI  This is a 71 year old male with history of coronary artery disease, hypertension who presents with chest pain. Patient reports 2 day history of waxing and waning chest pain. He reports associated nausea. It is anterior and pressure-like. It is nonradiating. Currently his pain free. Most recently had pain and nausea just prior to arrival. He states normally these episodes last for seconds. Reports similar symptoms with prior heart attacks. Patient reports that he has not noted any exertional component. He states that he played a full game of golf yesterday with no issues. Denies any diaphoresis or shortness of breath. Most recent stent placement was 2015.  Past Medical History:  Diagnosis Date  . Arthritis   . CAD (coronary artery disease)    a. 05/19/13: DES x 2 to RCA, staged DES to OM 05/22/13, residual LAD/diag disease for Rx mgmt  b. 05/20/14 [place] s/p DES to LAD  . CKD (chronic kidney disease), stage II   . Essential hypertension, benign   . GERD (gastroesophageal reflux disease)   . Thrombocytopenia [facility])     Patient Active Problem List   Diagnosis Date Noted  . Atypical chest pain 11/07/2014  . Chest pain 11/07/2014  . GERD (gastroesophageal reflux disease)   . CAD (coronary artery disease)   . Unstable angina (HCC) 05/20/2014  . CKD (chronic kidney disease), stage II 05/23/2013  . Thrombocytopenia (HCC)   . Essential hypertension, benign 05/19/2013    Past Surgical History:  Procedure Laterality Date  . Bilateral hip replacements    . CARDIAC CATHETERIZATION    . CORONARY ANGIOPLASTY WITH STENT PLACEMENT  05/20/2014   mid lad   . CORONARY STENT PLACEMENT  05/19/13  . FRACTIONAL FLOW RESERVE WIRE Right 05/22/2013   Procedure: FRACTIONAL FLOW  RESERVE WIRE;  Surgeon: Kathleene Hazel, MD;  Location: [facility] CATH LAB;  Service: Cardiovascular;  Laterality: Right;  . LEFT HEART CATHETERIZATION WITH CORONARY ANGIOGRAM N/A 05/19/2013   Procedure: LEFT HEART CATHETERIZATION WITH CORONARY ANGIOGRAM;  Surgeon: Lennette Bihari, MD;  Location: [facility] CATH LAB;  Service: Cardiovascular;  Laterality: N/A;  . LEFT HEART CATHETERIZATION WITH CORONARY ANGIOGRAM N/A 05/20/2014   Procedure: LEFT HEART CATHETERIZATION WITH CORONARY ANGIOGRAM;  Surgeon: Kathleene Hazel, MD;  Location: [facility] CATH LAB;  Service: Cardiovascular;  Laterality: N/A;  . PERCUTANEOUS CORONARY STENT INTERVENTION (PCI-S) N/A 05/22/2013   Procedure: PERCUTANEOUS CORONARY STENT INTERVENTION (PCI-S);  Surgeon: Kathleene Hazel, MD;  Location: [facility] CATH LAB;  Service: Cardiovascular;  Laterality: N/A;       Home Medications    Prior to Admission medications   Medication Sig Start Date End Date Taking? Authorizing Provider  aspirin EC 81 MG tablet Take 81 mg by mouth daily.    [provider]  atorvastatin (LIPITOR) 40 MG tablet Take 1 tablet (40 mg total) by mouth every evening. 05/23/13   Dunn, Tacey Ruiz, PA-C  Cholecalciferol (VITAMIN D-3) 1000 UNITS CAPS Take 1,000 Units by mouth daily.     [provider]  lisinopril-hydrochlorothiazide (PRINZIDE,ZESTORETIC) 20-12.5 MG tablet TAKE 1 TABLET DAILY 05/29/15   Antoine Poche, MD  metoprolol tartrate (LOPRESSOR) 25 MG tablet Take 25 mg by mouth 2 (two) times daily.    [provider]  nitroGLYCERIN (NITROSTAT) 0.4 MG SL tablet Place 1 tablet (0.4 mg total) under the tongue every 5 (five) minutes as needed for chest pain (up to 3 doses). 01/20/16   Jonelle Sidle, MD  omeprazole (PRILOSEC) 40 MG capsule Take 1 capsule (40 mg total) by mouth at bedtime. 11/07/14   Elliot Cousin, MD  ticagrelor (BRILINTA) 90 MG TABS tablet TAKE 1 TABLET TWICE A DAY (NEED APPOINTMENT FOR FURTHER REFILLS, OVERDUE FOR FOLLOW  UP, WAS DUE THIS FEBRUARY) 10/10/16   Jonelle Sidle, MD  vitamin B-12 (CYANOCOBALAMIN) 1000 MCG tablet Take 1,000 mcg by mouth daily.    [provider]    Family History Family History  Problem Relation Age of Onset  . Aneurysm Mother   . Heart disease Sister   . Diabetes Sister   . Diabetes Sister     Social History Social History  Substance Use Topics  . Smoking status: Former Smoker    Packs/day: 1.00    Years: 38.00    Types: Cigarettes    Start date: 10/16/1965    Quit date: 02/21/2002  . Smokeless tobacco: Never Used  . Alcohol use 0.6 oz/week    1 Glasses of wine per week     Allergies   Patient has no known allergies.   Review of Systems Review of Systems  Constitutional: Negative for fever.  Respiratory: Positive for chest tightness. Negative for shortness of breath.   Cardiovascular: Positive for chest pain. Negative for leg swelling.  Gastrointestinal: Positive for nausea. Negative for abdominal pain and vomiting.  Genitourinary: Negative for dysuria.  All other systems reviewed and are negative.    Physical Exam Updated Vital Signs BP 118/68   Pulse (!) 59   Temp 98 F (36.7 C) (Oral)   Resp 13   Ht  (1.651 m)   Wt 72.1 kg (159 lb)   SpO2 99%   BMI 26.46 kg/m   Physical Exam  Constitutional: He is oriented to person, place, and time. He appears well-developed and well-nourished. No distress.  HENT:  Head: Normocephalic and atraumatic.  Cardiovascular: Normal rate, regular rhythm and normal heart sounds.   No murmur heard. Pulmonary/Chest: Effort normal and breath sounds normal. No respiratory distress. He has no wheezes.  Abdominal: Soft. There is no tenderness.  Musculoskeletal: He exhibits no edema.  Neurological: He is alert and oriented to person, place, and time.  Skin: Skin is warm and dry.  Psychiatric: He has a normal mood and affect.  Nursing note and vitals reviewed.    ED Treatments / Results  Labs (all  labs ordered are listed, but only abnormal results are displayed) Labs Reviewed  CBC WITH DIFFERENTIAL/PLATELET - Abnormal; Notable for the following:       Result Value   Platelets 97 (*)    All other components within normal limits  BASIC METABOLIC PANEL - Abnormal; Notable for the following:    Potassium 3.4 (*)    Chloride 100 (*)    Glucose, Bld 153 (*)    Creatinine, Ser 1.57 (*)    GFR calc non Af Amer 43 (*)    GFR calc Af Amer 50 (*)    All other components within normal limits  TROPONIN I  TROPONIN I    EKG  EKG Interpretation  Date/Time:  Thursday December 01 2016 00:42:50 EDT Ventricular Rate:  60 PR Interval:    QRS Duration: 102 QT Interval:  410 QTC Calculation: 410 R Axis:  81 Text Interpretation:  Sinus rhythm Borderline right axis deviation Borderline T wave abnormalities Minimal ST elevation, inferior leads No significant change since last tracing Confirmed by Ross Marcus ([id number]) on 12/01/2016 5:22:33 AM       Radiology Dg Chest 2 View  Result Date: 12/01/2016 CLINICAL DATA:  Intermittent midsternal chest pain for 2 days. EXAM: CHEST  2 VIEW COMPARISON:  08/14/2015 FINDINGS: The lungs are clear. The pulmonary vasculature is normal. Heart size is normal. Hilar and mediastinal contours are unremarkable. There is no pleural effusion. IMPRESSION: No active cardiopulmonary disease. Electronically Signed   By: Ellery Plunk M.D.   On: 12/01/2016 01:51    Procedures Procedures (including critical care time)  Medications Ordered in ED Medications  aspirin chewable tablet 324 mg (324 mg Oral Given 12/01/16 0110)     Initial Impression / Assessment and Plan / ED Course  I have reviewed the triage vital signs and the nursing notes.  Pertinent labs & imaging results that were available during my care of the patient were reviewed by me and considered in my medical decision making (see chart for details).     Patient presents with chest pain.  Intermittent over the last 24 hours. He is nontoxic-appearing. Vital signs reassuring. EKG is unchanged. There are some features that are somewhat suggestive of ACS given prior similar symptoms; however, duration and character of symptoms are somewhat atypical. He remains chest pain-free in the ED. He was given full dose aspirin. Initial troponin is negative. Workup is largely reassuring. On recheck, he remains comfortable. I discussed with the patient options including admission for formal chest pain rule out versus repeat troponin at 4 hours. Patient does not wish to be admitted. Repeat troponin is negative. Recommend very close follow-up with cardiologist. Patient is scheduled for stress test in February. Cardiologist may elect to perform earlier.  After history, exam, and medical workup I feel the patient has been appropriately medically screened and is safe for discharge home. Pertinent diagnoses were discussed with the patient. Patient was given return precautions.   Final Clinical Impressions(s) / ED Diagnoses   Final diagnoses:  Atypical chest pain    New Prescriptions New Prescriptions   No medications on file     Shon Baton, MD 12/01/16 [phone]

## 2016-12-01 NOTE — Discharge Instructions (Signed)
You were seen today for chest pains. Her workup is reassuring. Given your risk factors, you need to follow-up very closely with cardiology. Your cardiologist may want to get a stress test earlier than scheduled. If you develop any new or worsening symptoms you should be reevaluated.

## 2016-12-12 ENCOUNTER — Encounter: Payer: Self-pay | Admitting: Cardiology

## 2016-12-12 ENCOUNTER — Ambulatory Visit (INDEPENDENT_AMBULATORY_CARE_PROVIDER_SITE_OTHER): Payer: Medicare Other | Admitting: Cardiology

## 2016-12-12 VITALS — BP 152/88 | HR 88 | Ht 65.0 in | Wt 162.0 lb

## 2016-12-12 DIAGNOSIS — E782 Mixed hyperlipidemia: Secondary | ICD-10-CM

## 2016-12-12 DIAGNOSIS — I25119 Atherosclerotic heart disease of native coronary artery with unspecified angina pectoris: Secondary | ICD-10-CM

## 2016-12-12 DIAGNOSIS — I209 Angina pectoris, unspecified: Secondary | ICD-10-CM

## 2016-12-12 DIAGNOSIS — N183 Chronic kidney disease, stage 3 unspecified: Secondary | ICD-10-CM

## 2016-12-12 DIAGNOSIS — M5136 Other intervertebral disc degeneration, lumbar region: Secondary | ICD-10-CM | POA: Diagnosis not present

## 2016-12-12 NOTE — Patient Instructions (Signed)
Medication Instructions:  Your physician recommends that you continue on your current medications as directed. Please refer to the Current Medication list given to you today.  Labwork: NONE  Testing/Procedures: NONE  Follow-Up: Your physician recommends that you schedule a follow-up appointment in February with Dr. Diona Browner  Any Other Special Instructions Will Be Listed Below (If Applicable).  If you need a refill on your cardiac medications before your next appointment, please call your pharmacy.

## 2016-12-12 NOTE — Progress Notes (Signed)
Cardiology Office Note  Date: 12/12/2016   ID: Colston Pyle., DOB 27-Feb-1945, MRN [id number]  PCP: Kirstie Peri, MD  Primary Cardiologist: Nona Dell, MD   Chief Complaint  Patient presents with  . Coronary Artery Disease    History of Present Illness: Keith Keith. is a 71 y.o. male last seen in August. He presents prior to regular scheduled office visit after ER encounter recently due to chest pain. Troponin I levels were negative. ECG showed nonspecific ST segment changes. He relates the episode in question to me today, states that he had eaten some pizza, later on felt persistent nausea and brief episodes of sharp shooting chest pain. It was the nausea that concerned him most since this had happened with previous coronary events. Fortunately, this was an isolated episode, he has been back to baseline playing golf and also walking for exercise without any recurring symptoms.  Back in August we had already discussed obtaining a follow-up Myoview study for his six-month visit. This is scheduled for February next year. We discussed this again today, he remains comfortable with observation at this time without pursuing follow-up cardiac testing in the absence of progressive symptoms.  Current cardiac regimen includes aspirin, Lipitor, Prinzide, Lopressor, Brilinta, and as needed nitroglycerin.  Past Medical History:  Diagnosis Date  . Arthritis   . CAD (coronary artery disease)    a. 05/19/13: DES x 2 to RCA, staged DES to OM 05/22/13, residual LAD/diag disease for Rx mgmt  b. 05/20/14 [place] s/p DES to LAD  . CKD (chronic kidney disease), stage II   . Essential hypertension, benign   . GERD (gastroesophageal reflux disease)   . Thrombocytopenia (HCC)     Past Surgical History:  Procedure Laterality Date  . Bilateral hip replacements    . CARDIAC CATHETERIZATION    . CORONARY ANGIOPLASTY WITH STENT PLACEMENT  05/20/2014   mid lad   . CORONARY STENT PLACEMENT  05/19/13    . FRACTIONAL FLOW RESERVE WIRE Right 05/22/2013   Procedure: FRACTIONAL FLOW RESERVE WIRE;  Surgeon: Kathleene Hazel, MD;  Location: [facility] CATH LAB;  Service: Cardiovascular;  Laterality: Right;  . LEFT HEART CATHETERIZATION WITH CORONARY ANGIOGRAM N/A 05/19/2013   Procedure: LEFT HEART CATHETERIZATION WITH CORONARY ANGIOGRAM;  Surgeon: Lennette Bihari, MD;  Location: [facility] CATH LAB;  Service: Cardiovascular;  Laterality: N/A;  . LEFT HEART CATHETERIZATION WITH CORONARY ANGIOGRAM N/A 05/20/2014   Procedure: LEFT HEART CATHETERIZATION WITH CORONARY ANGIOGRAM;  Surgeon: Kathleene Hazel, MD;  Location: [facility] CATH LAB;  Service: Cardiovascular;  Laterality: N/A;  . PERCUTANEOUS CORONARY STENT INTERVENTION (PCI-S) N/A 05/22/2013   Procedure: PERCUTANEOUS CORONARY STENT INTERVENTION (PCI-S);  Surgeon: Kathleene Hazel, MD;  Location: [facility] CATH LAB;  Service: Cardiovascular;  Laterality: N/A;    Current Outpatient Prescriptions  Medication Sig Dispense Refill  . aspirin EC 81 MG tablet Take 81 mg by mouth daily.    Marland Kitchen atorvastatin (LIPITOR) 40 MG tablet Take 1 tablet (40 mg total) by mouth every evening. 30 tablet 6  . Cholecalciferol (VITAMIN D-3) 1000 UNITS CAPS Take 1,000 Units by mouth daily.     Marland Kitchen lisinopril-hydrochlorothiazide (PRINZIDE,ZESTORETIC) 20-12.5 MG tablet TAKE 1 TABLET DAILY 90 tablet 1  . metoprolol tartrate (LOPRESSOR) 25 MG tablet Take 25 mg by mouth 2 (two) times daily.    . nitroGLYCERIN (NITROSTAT) 0.4 MG SL tablet Place 1 tablet (0.4 mg total) under the tongue every 5 (five) minutes as needed for chest pain (up  to 3 doses). 75 tablet 1  . omeprazole (PRILOSEC) 40 MG capsule Take 1 capsule (40 mg total) by mouth at bedtime. 30 capsule 6  . ticagrelor (BRILINTA) 90 MG TABS tablet TAKE 1 TABLET TWICE A DAY (NEED APPOINTMENT FOR FURTHER REFILLS, OVERDUE FOR FOLLOW UP, WAS DUE THIS FEBRUARY) 180 tablet 1  . vitamin B-12 (CYANOCOBALAMIN) 1000 MCG tablet Take 1,000 mcg by mouth  daily.     No current facility-administered medications for this visit.    Allergies:  Patient has no known allergies.   Social History: The patient  reports that he quit smoking about 14 years ago. His smoking use included Cigarettes. He started smoking about 51 years ago. He has a 38.00 pack-year smoking history. He has never used smokeless tobacco. He reports that he drinks about 0.6 oz of alcohol per week . He reports that he does not use drugs.   ROS:  Please see the history of present illness. Otherwise, complete review of systems is positive for reflux symptoms.  All other systems are reviewed and negative.   Physical Exam: VS:  BP (!) 152/88   Pulse 88   Ht 5\' 5"  (1.651 m)   Wt 162 lb (73.5 kg)   SpO2 98%   BMI 26.96 kg/m , BMI Body mass index is 26.96 kg/m.  Wt Readings from Last 3 Encounters:  12/12/16 162 lb (73.5 kg)  12/01/16 159 lb (72.1 kg)  10/10/16 159 lb (72.1 kg)    General: Patient appears comfortable at rest. HEENT: Conjunctiva and lids normal, oropharynx clear. Neck: Supple, no elevated JVP or carotid bruits, no thyromegaly. Lungs: Clear to auscultation, nonlabored breathing at rest. Cardiac: Regular rate and rhythm, no S3 or significant systolic murmur, no pericardial rub. Abdomen: Soft, nontender, bowel sounds present, no guarding or rebound. Extremities: No pitting edema, distal pulses 2+. Skin: Warm and dry. Musculoskeletal: No kyphosis. Neuropsychiatric: Alert and oriented x3, affect grossly appropriate.  ECG: I personally reviewed the tracing from 12/01/2016 which showed sinus rhythm with nonspecific ST changes.  Recent Labwork: 12/01/2016: BUN 13; Creatinine, Ser 1.57; Hemoglobin 14.7; Platelets 97; Potassium 3.4; Sodium 138   Other Studies Reviewed Today:  Echocardiogram 05/20/2014: Study Conclusions  - Left ventricle: The cavity size was normal. Wall thickness was increased in a pattern of moderate LVH. Systolic function was normal.  The estimated ejection fraction was in the range of 60% to 65%. Wall motion was normal; there were no regional wall motion abnormalities. Doppler parameters are consistent with abnormal left ventricular relaxation (grade 1 diastolic dysfunction). The E/e&' ratio is between 8-15, suggesting indeterminate LV filling pressure. - Aortic valve: Sclerosis without stenosis. There was no regurgitation. - Mitral valve: There was no significant regurgitation. - Left atrium: The atrium was normal in size. - Right ventricle: The cavity size was normal. Wall thickness was normal. Systolic function is reduced. - Right atrium: The atrium was normal in size.  Impressions:  - LVEF 60-65%, moderate LVH, diastolic dysfunction, indeterminate (but likely elevated LV Filling pressure), normal LA size, mildly reduced RV function by TAPSE, no significant TR.  Chest x-ray 12/01/2016: FINDINGS: The lungs are clear. The pulmonary vasculature is normal. Heart size is normal. Hilar and mediastinal contours are unremarkable. There is no pleural effusion.  IMPRESSION: No active cardiopulmonary disease.  Assessment and Plan:  1. Recent episode of atypical chest pain and nausea. No evidence of ACS on ER evaluation and ECG nonacute. He has had no recurrences, it is possible to this was related  to reflux. We have discussed the situation, and he is comfortable with observation at this point in the absence of any recurrence with his typical activity. He will let me know if symptoms escalate.  2. CAD status post DES 2 to the RCA in 2015, DES to obtuse marginal in 2015, and DES to the LAD in 2016. We will continue with medical therapy including DAPT and keep him on scheduled for Myoview in February for now.  3. Hyperlipidemia, continues on Lipitor.  4. CKD stage 3, creatinine stable at 1.57.  Current medicines were reviewed with the patient today.  Disposition: Keep scheduled follow-up in  February, sooner if needed.  Signed, Jonelle SidleSamuel G. Keeshawn Fakhouri, MD, [facility] Surgery[facility]ACC 12/12/2016 4:03 PM     [facility] [address] [place], [place], [place] [address] Phone: [phone]; Fax: [phone]

## 2016-12-29 ENCOUNTER — Encounter: Payer: Self-pay | Admitting: *Deleted

## 2016-12-29 ENCOUNTER — Encounter: Payer: Self-pay | Admitting: Gastroenterology

## 2016-12-29 ENCOUNTER — Ambulatory Visit (INDEPENDENT_AMBULATORY_CARE_PROVIDER_SITE_OTHER): Payer: Medicare Other | Admitting: Gastroenterology

## 2016-12-29 DIAGNOSIS — K8500 Idiopathic acute pancreatitis without necrosis or infection: Secondary | ICD-10-CM | POA: Diagnosis not present

## 2016-12-29 DIAGNOSIS — K859 Acute pancreatitis without necrosis or infection, unspecified: Secondary | ICD-10-CM | POA: Insufficient documentation

## 2016-12-29 DIAGNOSIS — I25119 Atherosclerotic heart disease of native coronary artery with unspecified angina pectoris: Secondary | ICD-10-CM | POA: Diagnosis not present

## 2016-12-29 NOTE — Assessment & Plan Note (Addendum)
AFTER EATING CASHEWS.   I PERSONALLY REVIEWED THE CT JUL 2018 WITH DR. Tyron Russell. IT DID SHOW UNCOMPLICATED PANCREATITIS, NO SPLENOMEGALY OR VARICES, OR CIRRHOSIS. Marland Kitchen NEEDS AND ULTRASOUND TO LOOK FOR GALLSTONES. OBTAIN LIPASE/AMYLASE FROM DR[facility]. COMPLETE LABS TO CHECK FOR HEPATITIS C AND REPEAT LIPASE.  FOLLOW UP IF NEEDED AFTER ULTRASOUND.

## 2016-12-29 NOTE — Patient Instructions (Addendum)
YOU HAD A CT SCAN IN July THAT DID SHOW UNCOMPLICATED PANCREATITIS. CT SCAN. YOU NEED AND ULTRASOUND TO LOOK FOR GALLSTONES.  COMPLETE LABS TO CHECK FOR HEPATITIS C AND REPEAT LIPASE.  FOLLOW UP IF NEEDED AFTER ULTRASOUND.

## 2016-12-29 NOTE — Progress Notes (Signed)
Subjective:    Patient ID: Keith Keith., male    DOB: [birth year], 71 y.o.   MRN: [id number]  Kirstie Peri, MD   HPI KNOWN HISTORY Keith Keith HOZY([id number]) June 2018: 1ST EPISODE(2 DAYS THEN LEFT). MISSED GOING T [facility] EVENT.ATE TOO MANY CASHEWS AND HAD EPIGASTRIC PAIN. THIS HAPPENED IN AUG 2018(2-3 DAYS). NO MORE CASHEWS, NO MORE PROBLEMS. LAST COLONOSCOPY IN [place] 2018. HAD [place] AND LABS AT [facility] THIS WAS SECOND EPISODE. HEARTBURN: NOT REALLY. Needs open mri but can tolerate CT scan   PT DENIES FEVER, CHILLS, HEMATOCHEZIA, HEMATEMESIS, nausea, vomiting, melena, diarrhea, CHEST PAIN, SHORTNESS OF BREATH,  CHANGE IN BOWEL IN HABITS, constipation, abdominal pain, problems swallowing, problems with sedation, OR heartburn or indigestion.  Past Medical History:  Diagnosis Date  . Arthritis   . CAD (coronary artery disease)    a. 05/19/13: DES x 2 to RCA, staged DES to OM 05/22/13, residual LAD/diag disease for Rx mgmt  b. 05/20/14 [place] s/p DES to LAD  . CKD (chronic kidney disease), stage II   . Essential hypertension, benign   . GERD (gastroesophageal reflux disease)   . Thrombocytopenia (HCC)     Past Surgical History:  Procedure Laterality Date  . Bilateral hip replacements    . CARDIAC CATHETERIZATION    . CORONARY ANGIOPLASTY WITH STENT PLACEMENT  05/20/2014   mid lad   . CORONARY STENT PLACEMENT  05/19/13   No Known Allergies  Current Outpatient Medications  Medication Sig Dispense Refill  . aspirin EC 81 MG tablet Take 81 mg by mouth daily.    Marland Kitchen atorvastatin (LIPITOR) 40 MG tablet Take 1 tablet (40 mg total) by mouth every evening.    . Cholecalciferol (VITAMIN D-3) 1000 UNITS CAPS Take 1,000 Units by mouth daily.     Marland Kitchen lisinopril-hydrochlorothiazide (PRINZIDE,ZESTORETIC) 20-12.5 MG tablet TAKE 1 TABLET DAILY    . metoprolol tartrate (LOPRESSOR) 25 MG tablet Take 25 mg by mouth 2 (two) times daily.    . nitroGLYCERIN (NITROSTAT) 0.4 MG SL tablet Place 1 tablet (0.4 mg total) under  the tongue every 5 (five) minutes as needed for chest pain (up to 3 doses).    Marland Kitchen omeprazole (PRILOSEC) 40 MG capsule Take 1 capsule (40 mg total) by mouth at bedtime.    . ticagrelor (BRILINTA) 90 MG TABS tablet TAKE 1 TABLET TWICE A DAY     . vitamin B-12 (CYANOCOBALAMIN) 1000 MCG tablet Take 1,000 mcg by mouth daily.     Family History  Problem Relation Age of Onset  . Aneurysm Mother   . Heart disease Sister   . Diabetes Sister   . Diabetes Sister    Social History   Socioeconomic History  . Marital status: Married    Spouse name: None  . Number of children: None  . Years of education: None  . Highest education level: None  Social Needs  . Financial resource strain: None  . Food insecurity - worry: None  . Food insecurity - inability: None  . Transportation needs - medical: None  . Transportation needs - non-medical: None  Occupational History  . None  Tobacco Use  . Smoking status: Former Smoker    Packs/day: 1.00    Years: 38.00    Pack years: 38.00    Types: Cigarettes    Start date: 10/16/1965    Last attempt to quit: 02/21/2002    Years since quitting: 14.8  . Smokeless tobacco: Never Used  Substance and Sexual Activity  .  Alcohol use: Yes    Comment: occ beer  . Drug use: No  . Sexual activity: None  Other Topics Concern  . None  Social History Narrative   VERY ACTIVE, [employer]. [place]    Review of Systems PER HPI OTHERWISE ALL SYSTEMS ARE NEGATIVE.    Objective:   Physical Exam  Constitutional: He is oriented to person, place, and time. He appears well-developed and well-nourished. No distress.  HENT:  Head: Normocephalic and atraumatic.  Mouth/Throat: Oropharynx is clear and moist. No oropharyngeal exudate.  Eyes: Pupils are equal, round, and reactive to light. No scleral icterus.  Neck: Normal range of motion. Neck supple.  Cardiovascular: Normal rate, regular rhythm and normal heart sounds.  Pulmonary/Chest: Effort normal and breath  sounds normal. No respiratory distress.  Abdominal: Soft. Bowel sounds are normal. He exhibits no distension. There is no tenderness.  Musculoskeletal: He exhibits no edema.  Lymphadenopathy:    He has no cervical adenopathy.  Neurological: He is alert and oriented to person, place, and time.  NO FOCAL DEFICITS  Psychiatric: He has a normal mood and affect.  Vitals reviewed.     Assessment & Plan:

## 2016-12-29 NOTE — Progress Notes (Signed)
   Subjective:    Patient ID: Keith Keith., male    DOB: [birth year], 71 y.o.   MRN: [id number]  HPI    Review of Systems     Objective:   Physical Exam        Assessment & Plan:

## 2016-12-30 NOTE — Progress Notes (Signed)
cc'ed to pcp °

## 2017-01-02 DIAGNOSIS — K8500 Idiopathic acute pancreatitis without necrosis or infection: Secondary | ICD-10-CM | POA: Diagnosis not present

## 2017-01-03 ENCOUNTER — Other Ambulatory Visit: Payer: Self-pay | Admitting: *Deleted

## 2017-01-03 DIAGNOSIS — D696 Thrombocytopenia, unspecified: Secondary | ICD-10-CM

## 2017-01-03 LAB — LIPASE: Lipase: 37 U/L (ref 7–60)

## 2017-01-03 LAB — HEPATITIS C ANTIBODY
HEP C AB: NONREACTIVE
SIGNAL TO CUT-OFF: 0 (ref <1.00)

## 2017-01-03 LAB — HEPATITIS A ANTIBODY, TOTAL: Hepatitis A AB,Total: REACTIVE — AB

## 2017-01-03 NOTE — Progress Notes (Signed)
LMOM to call.

## 2017-01-03 NOTE — Progress Notes (Signed)
PT is aware and OK to refer to Hematology.

## 2017-01-04 ENCOUNTER — Ambulatory Visit (HOSPITAL_COMMUNITY)
Admission: RE | Admit: 2017-01-04 | Discharge: 2017-01-04 | Disposition: A | Discharge status: Home / Self Care | Payer: Medicare Other | Source: Ambulatory Visit | Attending: Gastroenterology | Admitting: Gastroenterology

## 2017-01-04 DIAGNOSIS — K8500 Idiopathic acute pancreatitis without necrosis or infection: Secondary | ICD-10-CM | POA: Diagnosis not present

## 2017-01-05 NOTE — Progress Notes (Signed)
Pt is aware.  

## 2017-01-23 ENCOUNTER — Ambulatory Visit (HOSPITAL_COMMUNITY): Payer: Medicare Other | Admitting: Oncology

## 2017-01-27 ENCOUNTER — Encounter (HOSPITAL_COMMUNITY): Payer: Medicare Other | Attending: Oncology | Admitting: Oncology

## 2017-01-27 ENCOUNTER — Encounter (HOSPITAL_COMMUNITY): Payer: Medicare Other

## 2017-01-27 ENCOUNTER — Encounter (HOSPITAL_COMMUNITY): Payer: Self-pay | Admitting: Oncology

## 2017-01-27 VITALS — BP 140/74 | HR 77 | Temp 97.6°F | Resp 16 | Ht 65.0 in | Wt 161.3 lb

## 2017-01-27 DIAGNOSIS — I2510 Atherosclerotic heart disease of native coronary artery without angina pectoris: Secondary | ICD-10-CM | POA: Diagnosis not present

## 2017-01-27 DIAGNOSIS — Z7902 Long term (current) use of antithrombotics/antiplatelets: Secondary | ICD-10-CM | POA: Insufficient documentation

## 2017-01-27 DIAGNOSIS — Z833 Family history of diabetes mellitus: Secondary | ICD-10-CM | POA: Diagnosis not present

## 2017-01-27 DIAGNOSIS — D696 Thrombocytopenia, unspecified: Secondary | ICD-10-CM

## 2017-01-27 DIAGNOSIS — Z79899 Other long term (current) drug therapy: Secondary | ICD-10-CM | POA: Diagnosis not present

## 2017-01-27 DIAGNOSIS — Z7982 Long term (current) use of aspirin: Secondary | ICD-10-CM | POA: Insufficient documentation

## 2017-01-27 DIAGNOSIS — Z87891 Personal history of nicotine dependence: Secondary | ICD-10-CM | POA: Diagnosis not present

## 2017-01-27 DIAGNOSIS — N182 Chronic kidney disease, stage 2 (mild): Secondary | ICD-10-CM

## 2017-01-27 DIAGNOSIS — Z8249 Family history of ischemic heart disease and other diseases of the circulatory system: Secondary | ICD-10-CM | POA: Insufficient documentation

## 2017-01-27 DIAGNOSIS — I129 Hypertensive chronic kidney disease with stage 1 through stage 4 chronic kidney disease, or unspecified chronic kidney disease: Secondary | ICD-10-CM | POA: Insufficient documentation

## 2017-01-27 DIAGNOSIS — Z955 Presence of coronary angioplasty implant and graft: Secondary | ICD-10-CM | POA: Diagnosis not present

## 2017-01-27 DIAGNOSIS — M1990 Unspecified osteoarthritis, unspecified site: Secondary | ICD-10-CM | POA: Insufficient documentation

## 2017-01-27 DIAGNOSIS — K219 Gastro-esophageal reflux disease without esophagitis: Secondary | ICD-10-CM | POA: Diagnosis not present

## 2017-01-27 DIAGNOSIS — Z8371 Family history of colonic polyps: Secondary | ICD-10-CM | POA: Insufficient documentation

## 2017-01-27 LAB — COMPREHENSIVE METABOLIC PANEL
ALK PHOS: 66 U/L (ref 38–126)
ALT: 17 U/L (ref 17–63)
ANION GAP: 8 (ref 5–15)
AST: 27 U/L (ref 15–41)
Albumin: 4.3 g/dL (ref 3.5–5.0)
BILIRUBIN TOTAL: 0.9 mg/dL (ref 0.3–1.2)
BUN: 15 mg/dL (ref 6–20)
CALCIUM: 10.2 mg/dL (ref 8.9–10.3)
CO2: 28 mmol/L (ref 22–32)
CREATININE: 1.82 mg/dL — AB (ref 0.61–1.24)
Chloride: 99 mmol/L — ABNORMAL LOW (ref 101–111)
GFR calc non Af Amer: 36 mL/min — ABNORMAL LOW (ref >60)
GFR, EST AFRICAN AMERICAN: 42 mL/min — AB (ref >60)
Glucose, Bld: 115 mg/dL — ABNORMAL HIGH (ref 65–99)
Potassium: 3.8 mmol/L (ref 3.5–5.1)
Sodium: 135 mmol/L (ref 135–145)
TOTAL PROTEIN: 7.6 g/dL (ref 6.5–8.1)

## 2017-01-27 LAB — CBC WITH DIFFERENTIAL/PLATELET
Basophils Absolute: 0 10*3/uL (ref 0.0–0.1)
Basophils Relative: 1 %
Eosinophils Absolute: 0.2 10*3/uL (ref 0.0–0.7)
Eosinophils Relative: 3 %
HEMATOCRIT: 46.2 % (ref 39.0–52.0)
HEMOGLOBIN: 15.2 g/dL (ref 13.0–17.0)
LYMPHS ABS: 2 10*3/uL (ref 0.7–4.0)
LYMPHS PCT: 32 %
MCH: 29.4 pg (ref 26.0–34.0)
MCHC: 32.9 g/dL (ref 30.0–36.0)
MCV: 89.4 fL (ref 78.0–100.0)
MONOS PCT: 7 %
Monocytes Absolute: 0.4 10*3/uL (ref 0.1–1.0)
NEUTROS ABS: 3.7 10*3/uL (ref 1.7–7.7)
NEUTROS PCT: 57 %
Platelets: 121 10*3/uL — ABNORMAL LOW (ref 150–400)
RBC: 5.17 MIL/uL (ref 4.22–5.81)
RDW: 13.7 % (ref 11.5–15.5)
WBC: 6.3 10*3/uL (ref 4.0–10.5)

## 2017-01-27 LAB — FOLATE: FOLATE: 21.9 ng/mL (ref >5.9)

## 2017-01-27 LAB — VITAMIN B12: Vitamin B-12: 1146 pg/mL — ABNORMAL HIGH (ref 180–914)

## 2017-01-27 NOTE — Patient Instructions (Signed)
[facility] at [facility]  Discharge Instructions:  You were seen by Dr. Zhou today. _______________________________________________________________  Thank you for choosing [facility] at [facility] to provide your oncology and hematology care.  To afford each patient quality time with our providers, please arrive at least 15 minutes before your scheduled appointment.  You need to re-schedule your appointment if you arrive 10 or more minutes late.  We strive to give you quality time with our providers, and arriving late affects you and other patients whose appointments are after yours.  Also, if you no show three or more times for appointments you may be dismissed from the clinic.  Again, thank you for choosing [facility] at [facility]. Our hope is that these requests will allow you access to exceptional care and in a timely manner. _______________________________________________________________  If you have questions after your visit, please contact our office at [phone] between the hours of 8:30 a.m. and 5:00 p.m. Voicemails left after 4:30 p.m. will not be returned until the following business day. _______________________________________________________________  For prescription refill requests, have your pharmacy contact our office. _______________________________________________________________  Recommendations made by the consultant and any test results will be sent to your referring physician. _______________________________________________________________ 

## 2017-01-27 NOTE — Progress Notes (Signed)
[facility] Initial Visit:  Patient Care Team: [name], MD as PCP - General (Internal Medicine) Satira Sark, MD as Consulting Physician (Cardiology) Danie Binder, MD as Consulting Physician (Gastroenterology)  CHIEF COMPLAINTS/PURPOSE OF CONSULTATION:  Thrombocytopenia  HISTORY OF PRESENTING ILLNESS: Keith Keith. 71 y.o. male is here because of chronic thrombocytopenia. Review of his most recent CBC from 12/01/2016 demonstrated WBC 5.8K, hemoglobin 14.7 g/dL, hematocrit 43.9%, platelet count 97K.  Review of his CBCs for the past 2 years have demonstrated that his platelet count has ranged between 80 4K to 113 K.  His WBC and hemoglobin of always been normal.  Patient is completely asymptomatic and has not had any problems with bleeding or bruising.  He states that overall he feels well and has no complaints.  He states his energy and appetite level are at 100%.  He denies any chest pain, shortness of breath, abdominal pain, nausea, vomiting, diarrhea, focal weakness.  Review of Systems - Oncology ROS as per HPI otherwise 12 point ROS is negative.  MEDICAL HISTORY: Past Medical History:  Diagnosis Date  . Arthritis   . CAD (coronary artery disease)    a. 05/19/13: DES x 2 to RCA, staged DES to OM 05/22/13, residual LAD/diag disease for Rx mgmt  b. 05/20/14 [place] s/p DES to LAD  . CKD (chronic kidney disease), stage II   . Essential hypertension, benign   . GERD (gastroesophageal reflux disease)   . Thrombocytopenia ([place])     SURGICAL HISTORY: Past Surgical History:  Procedure Laterality Date  . Bilateral hip replacements    . CARDIAC CATHETERIZATION    . COLONOSCOPY W/ POLYPECTOMY  05/2016   COLON POLYPS REMOVED(>3) [facility]  . CORONARY ANGIOPLASTY WITH STENT PLACEMENT  05/20/2014   mid lad   . CORONARY STENT PLACEMENT  05/19/13  . FRACTIONAL FLOW RESERVE WIRE Right 05/22/2013   Procedure: FRACTIONAL FLOW RESERVE WIRE;  Surgeon: Burnell Blanks, MD;  Location: [facility] CATH LAB;  Service: Cardiovascular;  Laterality: Right;  . LEFT HEART CATHETERIZATION WITH CORONARY ANGIOGRAM N/A 05/19/2013   Procedure: LEFT HEART CATHETERIZATION WITH CORONARY ANGIOGRAM;  Surgeon: Troy Sine, MD;  Location: [facility] CATH LAB;  Service: Cardiovascular;  Laterality: N/A;  . LEFT HEART CATHETERIZATION WITH CORONARY ANGIOGRAM N/A 05/20/2014   Procedure: LEFT HEART CATHETERIZATION WITH CORONARY ANGIOGRAM;  Surgeon: Burnell Blanks, MD;  Location: [facility] CATH LAB;  Service: Cardiovascular;  Laterality: N/A;  . PERCUTANEOUS CORONARY STENT INTERVENTION (PCI-S) N/A 05/22/2013   Procedure: PERCUTANEOUS CORONARY STENT INTERVENTION (PCI-S);  Surgeon: Burnell Blanks, MD;  Location: [facility] CATH LAB;  Service: Cardiovascular;  Laterality: N/A;    SOCIAL HISTORY: Social History   Socioeconomic History  . Marital status: Married    Spouse name: Not on file  . Number of children: Not on file  . Years of education: Not on file  . Highest education level: Not on file  Social Needs  . Financial resource strain: Not on file  . Food insecurity - worry: Not on file  . Food insecurity - inability: Not on file  . Transportation needs - medical: Not on file  . Transportation needs - non-medical: Not on file  Occupational History  . Not on file  Tobacco Use  . Smoking status: Former Smoker    Packs/day: 1.00    Years: 38.00    Pack years: 38.00    Types: Cigarettes    Start date: 10/16/1965    Last  attempt to quit: 02/21/2002    Years since quitting: 14.9  . Smokeless tobacco: Never Used  Substance and Sexual Activity  . Alcohol use: Yes    Comment: occ beer  . Drug use: No  . Sexual activity: Not on file  Other Topics Concern  . Not on file  Social History Narrative   VERY ACTIVE, AVID [employer]. [place] [employer]    FAMILY HISTORY Family History  Problem Relation Age of Onset  . Aneurysm Mother   . Heart disease Sister   . Diabetes Sister   . Diabetes  Sister   . Colon polyps Sister   . Colon cancer Neg Hx     ALLERGIES:  has No Known Allergies.  MEDICATIONS:  Current Outpatient Medications  Medication Sig Dispense Refill  . aspirin EC 81 MG tablet Take 81 mg by mouth daily.    Marland Kitchen atorvastatin (LIPITOR) 40 MG tablet Take 1 tablet (40 mg total) by mouth every evening. 30 tablet 6  . Cholecalciferol (VITAMIN D-3) 1000 UNITS CAPS Take 1,000 Units by mouth daily.     Marland Kitchen lisinopril-hydrochlorothiazide (PRINZIDE,ZESTORETIC) 20-12.5 MG tablet TAKE 1 TABLET DAILY 90 tablet 1  . metoprolol tartrate (LOPRESSOR) 25 MG tablet Take 25 mg by mouth 2 (two) times daily.    . nitroGLYCERIN (NITROSTAT) 0.4 MG SL tablet Place 1 tablet (0.4 mg total) under the tongue every 5 (five) minutes as needed for chest pain (up to 3 doses). 75 tablet 1  . omeprazole (PRILOSEC) 40 MG capsule Take 1 capsule (40 mg total) by mouth at bedtime. 30 capsule 6  . ticagrelor (BRILINTA) 90 MG TABS tablet TAKE 1 TABLET TWICE A DAY (NEED APPOINTMENT FOR FURTHER REFILLS, OVERDUE FOR FOLLOW UP, WAS DUE THIS FEBRUARY) 180 tablet 1  . vitamin B-12 (CYANOCOBALAMIN) 1000 MCG tablet Take 1,000 mcg by mouth daily.     No current facility-administered medications for this visit.     PHYSICAL EXAMINATION:  ECOG PERFORMANCE STATUS: 0 - Asymptomatic   Vitals:   01/27/17 1257  BP: 140/74  Pulse: 77  Resp: 16  Temp: 97.6 F (36.4 C)  SpO2: 100%    Filed Weights   01/27/17 1257  Weight: 161 lb 4.8 oz (73.2 kg)     Physical Exam Constitutional: Well-developed, well-nourished, and in no distress.   HENT:  Head: Normocephalic and atraumatic.  Mouth/Throat: No oropharyngeal exudate. Mucosa moist. Eyes: Pupils are equal, round, and reactive to light. Conjunctivae are normal. No scleral icterus.  Neck: Normal range of motion. Neck supple. No JVD present.  Cardiovascular: Normal rate, regular rhythm and normal heart sounds.  Exam reveals no gallop and no friction rub.   No  murmur heard. Pulmonary/Chest: Effort normal and breath sounds normal. No respiratory distress. No wheezes.No rales.  Abdominal: Soft. Bowel sounds are normal. No distension. There is no tenderness. There is no guarding.  Musculoskeletal: No edema or tenderness.  Lymphadenopathy:    No cervical or supraclavicular adenopathy.  Neurological: Alert and oriented to person, place, and time. No cranial nerve deficit.  Skin: Skin is warm and dry. No rash noted. No erythema. No pallor.  Psychiatric: Affect and judgment normal.    LABORATORY DATA: I have personally reviewed the data as listed:  Appointment on 01/27/2017  Component Date Value Ref Range Status  . WBC 01/27/2017 6.3  4.0 - 10.5 K/uL Final  . RBC 01/27/2017 5.17  4.22 - 5.81 MIL/uL Final  . Hemoglobin 01/27/2017 15.2  13.0 - 17.0 g/dL Final  .  HCT 01/27/2017 46.2  39.0 - 52.0 % Final  . MCV 01/27/2017 89.4  78.0 - 100.0 fL Final  . MCH 01/27/2017 29.4  26.0 - 34.0 pg Final  . MCHC 01/27/2017 32.9  30.0 - 36.0 g/dL Final  . RDW 01/27/2017 13.7  11.5 - 15.5 % Final  . Platelets 01/27/2017 121* 150 - 400 K/uL Final  . Neutrophils Relative % 01/27/2017 57  % Final  . Neutro Abs 01/27/2017 3.7  1.7 - 7.7 K/uL Final  . Lymphocytes Relative 01/27/2017 32  % Final  . Lymphs Abs 01/27/2017 2.0  0.7 - 4.0 K/uL Final  . Monocytes Relative 01/27/2017 7  % Final  . Monocytes Absolute 01/27/2017 0.4  0.1 - 1.0 K/uL Final  . Eosinophils Relative 01/27/2017 3  % Final  . Eosinophils Absolute 01/27/2017 0.2  0.0 - 0.7 K/uL Final  . Basophils Relative 01/27/2017 1  % Final  . Basophils Absolute 01/27/2017 0.0  0.0 - 0.1 K/uL Final  . Sodium 01/27/2017 135  135 - 145 mmol/L Final  . Potassium 01/27/2017 3.8  3.5 - 5.1 mmol/L Final  . Chloride 01/27/2017 99* 101 - 111 mmol/L Final  . CO2 01/27/2017 28  22 - 32 mmol/L Final  . Glucose, Bld 01/27/2017 115* 65 - 99 mg/dL Final  . BUN 01/27/2017 15  6 - 20 mg/dL Final  . Creatinine, Ser  01/27/2017 1.82* 0.61 - 1.24 mg/dL Final  . Calcium 01/27/2017 10.2  8.9 - 10.3 mg/dL Final  . Total Protein 01/27/2017 7.6  6.5 - 8.1 g/dL Final  . Albumin 01/27/2017 4.3  3.5 - 5.0 g/dL Final  . AST 01/27/2017 27  15 - 41 U/L Final  . ALT 01/27/2017 17  17 - 63 U/L Final  . Alkaline Phosphatase 01/27/2017 66  38 - 126 U/L Final  . Total Bilirubin 01/27/2017 0.9  0.3 - 1.2 mg/dL Final  . GFR calc non Af Amer 01/27/2017 36* >60 mL/min Final  . GFR calc Af Amer 01/27/2017 42* >60 mL/min Final   Comment: (NOTE) The eGFR has been calculated using the CKD EPI equation. This calculation has not been validated in all clinical situations. eGFR's persistently <60 mL/min signify possible Chronic Kidney Disease.   . Anion gap 01/27/2017 8  5 - 15 Final  Office Visit on 12/29/2016  Component Date Value Ref Range Status  . Hepatitis C Ab 01/02/2017 NON-REACTIVE  NON-REACTI Final  . SIGNAL TO CUT-OFF 01/02/2017 0.00  <1.00 Final  . Hepatitis A AB,Total 01/02/2017 REACTIVE* NON-REACTI Final  . Lipase 01/02/2017 37  7 - 60 U/L Final    RADIOGRAPHIC STUDIES: I have personally reviewed the radiological images as listed and agree with the findings in the report  No results found.  ASSESSMENT/PLAN Chronic thrombocytopenia of unclear etiology. Has been stable for at least 2 years in the 80k-low 100k range.   PLAN: -Will rule out nutritional deficiencies such as B12, folate, copper causing thrombocytopenia. -Patient has evidence of a fatty liver on limited RUQ [place] in 12/2016, will check a complete abd [place] to r/o splenomegaly causing splenic sequestration.  -This may be ITP, but it is a diagnosis of exclusion. -Review of his medications did not show any cause for drug induced thrombocytopenia. -I have discussed potentially doing bone marrow biopsy, patient seems hesistant. However since his platelets are only mildly low, may be able to hold off on performing bone marrow biopsy at this time. -RTC in 2  weeks to review labs and discuss  next plan of care.  Orders Placed This Encounter  Procedures  . US Abdomen Complete    Standing Status:   Future    Standing Expiration Date:   01/27/2018    Order Specific Question:   Reason for Exam (SYMPTOM  OR DIAGNOSIS REQUIRED)    Answer:   thrombocytopenia, rule out splenomegaly.    Order Specific Question:   Preferred imaging location?    Answer:   [facility]  . CBC with Differential    Standing Status:   Future    Number of Occurrences:   1    Standing Expiration Date:   01/27/2018  . Comprehensive metabolic panel    Standing Status:   Future    Number of Occurrences:   1    Standing Expiration Date:   01/27/2018  . Vitamin B12    Standing Status:   Future    Number of Occurrences:   1    Standing Expiration Date:   01/27/2018  . Folate    Standing Status:   Future    Number of Occurrences:   1    Standing Expiration Date:   01/27/2018  . Copper, serum    Standing Status:   Future    Number of Occurrences:   1    Standing Expiration Date:   01/27/2018    All questions were answered. The patient knows to call the clinic with any problems, questions or concerns.  This note was electronically signed.    Twana First, MD  01/27/2017 2:51 PM

## 2017-01-28 LAB — COPPER, SERUM: Copper: 115 ug/dL (ref 72–166)

## 2017-02-01 ENCOUNTER — Encounter: Payer: Self-pay | Admitting: Gastroenterology

## 2017-02-02 ENCOUNTER — Ambulatory Visit (HOSPITAL_COMMUNITY)
Admission: RE | Admit: 2017-02-02 | Discharge: 2017-02-02 | Disposition: A | Discharge status: Home / Self Care | Payer: Medicare Other | Source: Ambulatory Visit | Attending: Oncology | Admitting: Oncology

## 2017-02-02 DIAGNOSIS — R932 Abnormal findings on diagnostic imaging of liver and biliary tract: Secondary | ICD-10-CM | POA: Diagnosis not present

## 2017-02-02 DIAGNOSIS — D696 Thrombocytopenia, unspecified: Secondary | ICD-10-CM | POA: Insufficient documentation

## 2017-02-08 DIAGNOSIS — I2510 Atherosclerotic heart disease of native coronary artery without angina pectoris: Secondary | ICD-10-CM | POA: Diagnosis not present

## 2017-02-08 DIAGNOSIS — E7800 Pure hypercholesterolemia, unspecified: Secondary | ICD-10-CM | POA: Diagnosis not present

## 2017-02-08 DIAGNOSIS — I10 Essential (primary) hypertension: Secondary | ICD-10-CM | POA: Diagnosis not present

## 2017-02-10 ENCOUNTER — Ambulatory Visit (HOSPITAL_COMMUNITY): Payer: Medicare Other

## 2017-02-10 ENCOUNTER — Encounter (HOSPITAL_BASED_OUTPATIENT_CLINIC_OR_DEPARTMENT_OTHER): Payer: Medicare Other | Admitting: Oncology

## 2017-02-10 ENCOUNTER — Encounter (HOSPITAL_COMMUNITY): Payer: Medicare Other

## 2017-02-10 ENCOUNTER — Encounter (HOSPITAL_COMMUNITY): Payer: Self-pay | Admitting: Oncology

## 2017-02-10 ENCOUNTER — Other Ambulatory Visit: Payer: Self-pay

## 2017-02-10 VITALS — BP 155/83 | HR 82 | Temp 98.4°F | Resp 16 | Ht 65.0 in | Wt 165.0 lb

## 2017-02-10 DIAGNOSIS — D696 Thrombocytopenia, unspecified: Secondary | ICD-10-CM | POA: Diagnosis not present

## 2017-02-10 DIAGNOSIS — M1990 Unspecified osteoarthritis, unspecified site: Secondary | ICD-10-CM | POA: Diagnosis not present

## 2017-02-10 DIAGNOSIS — K219 Gastro-esophageal reflux disease without esophagitis: Secondary | ICD-10-CM | POA: Diagnosis not present

## 2017-02-10 DIAGNOSIS — N182 Chronic kidney disease, stage 2 (mild): Secondary | ICD-10-CM

## 2017-02-10 DIAGNOSIS — I129 Hypertensive chronic kidney disease with stage 1 through stage 4 chronic kidney disease, or unspecified chronic kidney disease: Secondary | ICD-10-CM | POA: Diagnosis not present

## 2017-02-10 DIAGNOSIS — I2510 Atherosclerotic heart disease of native coronary artery without angina pectoris: Secondary | ICD-10-CM | POA: Diagnosis not present

## 2017-02-10 DIAGNOSIS — I10 Essential (primary) hypertension: Secondary | ICD-10-CM

## 2017-02-10 DIAGNOSIS — K760 Fatty (change of) liver, not elsewhere classified: Secondary | ICD-10-CM | POA: Diagnosis not present

## 2017-02-10 LAB — CBC WITH DIFFERENTIAL/PLATELET
BASOS ABS: 0 10*3/uL (ref 0.0–0.1)
BASOS PCT: 0 %
Eosinophils Absolute: 0.1 10*3/uL (ref 0.0–0.7)
Eosinophils Relative: 3 %
HCT: 44.1 % (ref 39.0–52.0)
HEMOGLOBIN: 14.5 g/dL (ref 13.0–17.0)
Lymphocytes Relative: 36 %
Lymphs Abs: 1.8 10*3/uL (ref 0.7–4.0)
MCH: 29.4 pg (ref 26.0–34.0)
MCHC: 32.9 g/dL (ref 30.0–36.0)
MCV: 89.3 fL (ref 78.0–100.0)
Monocytes Absolute: 0.2 10*3/uL (ref 0.1–1.0)
Monocytes Relative: 4 %
NEUTROS ABS: 2.8 10*3/uL (ref 1.7–7.7)
NEUTROS PCT: 57 %
Platelets: 119 10*3/uL — ABNORMAL LOW (ref 150–400)
RBC: 4.94 MIL/uL (ref 4.22–5.81)
RDW: 13.7 % (ref 11.5–15.5)
WBC: 5 10*3/uL (ref 4.0–10.5)

## 2017-02-10 LAB — RAPID HIV SCREEN (HIV 1/2 AB+AG)
HIV 1/2 ANTIBODIES: NONREACTIVE
HIV-1 P24 Antigen - HIV24: NONREACTIVE

## 2017-02-10 NOTE — Patient Instructions (Signed)
Blood work performed on 01/27/2017 was stable with a mild improvement in platelet count to 121,000. Vitamin studies including vitamin B12, folate, and copper were normal. We will perform additional blood work today to rule out other causes possibly contributing to your mildly low platelet count. Laboratory work in 6 weeks Laboratory work in 12 weeks Return in 12 weeks for follow-up

## 2017-02-10 NOTE — Progress Notes (Signed)
Keith Blitz, MD [place] [id number]  Thrombocytopenia [facility]) - Plan: CBC with Differential, Pathologist smear review, Rapid HIV screen (HIV 1/2 Ab+Ag), CBC with Differential, CBC with Differential  CKD (chronic kidney disease), stage II  Essential hypertension, benign  Fatty infiltration of liver  CURRENT THERAPY: Complete peripheral work-up.  INTERVAL HISTORY: Keith Keith. 71 y.o. male returns for followup of chronic thrombocytopenia dating back to at least 2015 preservation of WBC and RBC. He denies any abnormal bleeding or bruising. He has completed hepatitis C testing which was negative. Vitamin testing most recently has been negative including B12, folate, and copper. The chemistries recently is stable and at baseline. He denies any bleeding in his stools. He denies any black stools. He denies any gross hematuria. He denies any changes in medications. He also underwent ultrasound imaging to rule out splenomegaly. Ultrasound confirmed a spleen size was normal but he does appear to have fatty infiltration of liver which could be contributing to his low platelet count.  Of note he did serve in the [place] [employer] and he is unsure whether he was exposed to agent orange. Of course, this is a risk for development of multiple myeloma. If he has changes in bloodwork, we may need to consider screening for multiple myeloma.  HPI Elements   Location: Blood   Quality: Low platelet count   Severity: Mild   Duration: Since at least 2015   Context: In the setting of fatty infiltration of liver   Timing:   Modifying Factors: Negative peripheral workup thus far   Associated Signs & Symptoms:     Review of Systems  Constitutional: Negative.  Negative for chills, fever and weight loss.  HENT: Negative.   Eyes: Negative.   Respiratory: Negative.  Negative for cough.   Cardiovascular: Negative.  Negative for chest pain.  Gastrointestinal: Negative.  Negative for blood in  stool, constipation, diarrhea, melena, nausea and vomiting.  Genitourinary: Negative.   Musculoskeletal: Negative.   Skin: Negative.   Neurological: Negative.  Negative for weakness.  Endo/Heme/Allergies: Negative.   Psychiatric/Behavioral: Negative.     Past Medical History:  Diagnosis Date  . Arthritis   . CAD (coronary artery disease)    a. 05/19/13: DES x 2 to RCA, staged DES to OM 05/22/13, residual LAD/diag disease for Rx mgmt  b. 05/20/14 [place] s/p DES to LAD  . CKD (chronic kidney disease), stage II   . Essential hypertension, benign   . GERD (gastroesophageal reflux disease)   . Thrombocytopenia ([place])     Past Surgical History:  Procedure Laterality Date  . Bilateral hip replacements    . CARDIAC CATHETERIZATION    . COLONOSCOPY W/ POLYPECTOMY  05/2016   COLON POLYPS REMOVED(>3) [place] [facility]  . CORONARY ANGIOPLASTY WITH STENT PLACEMENT  05/20/2014   mid lad   . CORONARY STENT PLACEMENT  05/19/13  . FRACTIONAL FLOW RESERVE WIRE Right 05/22/2013   Procedure: FRACTIONAL FLOW RESERVE WIRE;  Surgeon: Burnell Blanks, MD;  Location: [facility] CATH LAB;  Service: Cardiovascular;  Laterality: Right;  . LEFT HEART CATHETERIZATION WITH CORONARY ANGIOGRAM N/A 05/19/2013   Procedure: LEFT HEART CATHETERIZATION WITH CORONARY ANGIOGRAM;  Surgeon: Troy Sine, MD;  Location: [facility] CATH LAB;  Service: Cardiovascular;  Laterality: N/A;  . LEFT HEART CATHETERIZATION WITH CORONARY ANGIOGRAM N/A 05/20/2014   Procedure: LEFT HEART CATHETERIZATION WITH CORONARY ANGIOGRAM;  Surgeon: Burnell Blanks, MD;  Location: [facility] CATH LAB;  Service: Cardiovascular;  Laterality: N/A;  . PERCUTANEOUS CORONARY STENT INTERVENTION (PCI-S) N/A 05/22/2013   Procedure: PERCUTANEOUS CORONARY STENT INTERVENTION (PCI-S);  Surgeon: Burnell Blanks, MD;  Location: [facility] CATH LAB;  Service: Cardiovascular;  Laterality: N/A;    Family History  Problem Relation Age of Onset  . Aneurysm Mother   . Heart disease Sister   .  Diabetes Sister   . Diabetes Sister   . Colon polyps Sister   . Colon cancer Neg Hx     Social History   Socioeconomic History  . Marital status: Married    Spouse name: None  . Number of children: None  . Years of education: None  . Highest education level: None  Social Needs  . Financial resource strain: None  . Food insecurity - worry: None  . Food insecurity - inability: None  . Transportation needs - medical: None  . Transportation needs - non-medical: None  Occupational History  . None  Tobacco Use  . Smoking status: Former Smoker    Packs/day: 1.00    Years: 38.00    Pack years: 38.00    Types: Cigarettes    Start date: 10/16/1965    Last attempt to quit: 02/21/2002    Years since quitting: 14.9  . Smokeless tobacco: Never Used  Substance and Sexual Activity  . Alcohol use: Yes    Comment: occ beer  . Drug use: No  . Sexual activity: None  Other Topics Concern  . None  Social History Narrative   VERY ACTIVE, [employer]. [place] [employer]     PHYSICAL EXAMINATION  ECOG PERFORMANCE STATUS: 0 - Asymptomatic  Vitals:   02/10/17 1126  BP: (!) 155/83  Pulse: 82  Resp: 16  Temp: 98.4 F (36.9 C)  SpO2: 100%    GENERAL:alert, no distress, well nourished, well developed, comfortable, cooperative, obese, smiling and accompanied by his wife SKIN: skin color, texture, turgor are normal, no rashes or significant lesions HEAD: Normocephalic, No masses, lesions, tenderness or abnormalities EYES: normal, EOMI, Conjunctiva are pink and non-injected EARS: External ears normal OROPHARYNX:lips, buccal mucosa, and tongue normal and mucous membranes are moist  NECK: supple, no adenopathy, trachea midline LYMPH:  no hepatosplenomegaly BREAST:not examined LUNGS: clear to auscultation  HEART: regular rate & rhythm, no murmurs and no gallops ABDOMEN:abdomen soft, non-tender and obese BACK: Back symmetric, no curvature. EXTREMITIES:less then 2 second capillary refill, no  joint deformities, effusion, or inflammation, no skin discoloration, no cyanosis  NEURO: alert & oriented x 3 with fluent speech, no focal motor/sensory deficits, gait normal   LABORATORY DATA: CBC    Component Value Date/Time   WBC 6.3 01/27/2017 1328   RBC 5.17 01/27/2017 1328   HGB 15.2 01/27/2017 1328   HCT 46.2 01/27/2017 1328   PLT 121 (L) 01/27/2017 1328   MCV 89.4 01/27/2017 1328   MCH 29.4 01/27/2017 1328   MCHC 32.9 01/27/2017 1328   RDW 13.7 01/27/2017 1328   LYMPHSABS 2.0 01/27/2017 1328   MONOABS 0.4 01/27/2017 1328   EOSABS 0.2 01/27/2017 1328   BASOSABS 0.0 01/27/2017 1328      Chemistry      Component Value Date/Time   NA 135 01/27/2017 1328   K 3.8 01/27/2017 1328   CL 99 (L) 01/27/2017 1328   CO2 28 01/27/2017 1328   BUN 15 01/27/2017 1328   CREATININE 1.82 (H) 01/27/2017 1328   CREATININE 1.55 (H) 07/17/2013 1118      Component Value Date/Time   CALCIUM 10.2 01/27/2017 1328  ALKPHOS 66 01/27/2017 1328   AST 27 01/27/2017 1328   ALT 17 01/27/2017 1328   BILITOT 0.9 01/27/2017 1328     Lab Results  Component Value Date   VITAMINB12 1,146 (H) 01/27/2017   Lab Results  Component Value Date   FOLATE 21.9 01/27/2017      PENDING LABS:   RADIOGRAPHIC STUDIES:  US Abdomen Complete  Result Date: 02/02/2017 CLINICAL DATA:  Thrombus cytopenia evaluate for splenomegaly. EXAM: ABDOMEN ULTRASOUND COMPLETE COMPARISON:  Right upper quadrant abdominal ultrasound of January 04, 2017 FINDINGS: Gallbladder: No gallstones or wall thickening visualized. No sonographic Murphy sign noted by sonographer. Common bile duct: Diameter: 2.5 mm Liver: The hepatic echotexture is increased similar to that seen previously. The surface contour of the liver remains smooth. There is no focal mass or ductal dilation. Portal vein is patent on color Doppler imaging with normal direction of blood flow towards the liver. IVC: No abnormality visualized. Pancreas: Bowel gas  limits evaluation of the pancreas. Spleen: The splenic length is 8.1 cm it which is normal. The splenic echotexture is normal. Right Kidney: Length: 10.9 cm. Echogenicity within normal limits. No mass or hydronephrosis visualized. Left Kidney: Length: 9.2 cm. Echogenicity within normal limits. No mass or hydronephrosis visualized. Abdominal aorta: No aneurysm visualized. Other findings: None. IMPRESSION: Normal size and echotexture of the spleen. Increased hepatic echotexture compatible with fatty infiltrative change. No focal masses. Limited visualization of the pancreas due to bowel gas. Electronically Signed   By: David  Martinique M.D.   On: 02/02/2017 09:14     PATHOLOGY:    ASSESSMENT AND PLAN:  Thrombocytopenia ([place]) Thrombocytopenia dating back to at least 2015 with preservation of WBC and hemoglobin/RBC/HCT.  Peripheral work-up thus far has ruled out B12 and folate deficiency, copper deficiency, and hypersplenism (on ultrasound).  Hepatitis C testing recently was negative.  He does have fatty infiltration of liver which could be the culprit.  Given the chronicity of thrombocytopenia and other blood counts being normal, this is likely a benign process and its stability it promising.  Will complete peripheral work-up today.  Degrees of thrombocytopenia can be divided into mild (platelet count 100,000 to 150,000/microL), moderate (50,000 to 99,000/microL), and severe (<50,000/microL). Severe thrombocytopenia confers a greater risk of bleeding, but the correlation between platelet count and bleeding risk varies according to the underlying condition and may be unpredictable.  Asymptomatic, incidental findings, mild thrombocytopenia is commonly caused by immune thrombocytopenia (ITP), occult liver disease, HIV infection, and myelodysplastic syndromes.  Congenital thrombocytopenic conditions, sometimes misdiagnosed as ITP, may also occur.  In a patient with incidentally discovered asymptomatic  thrombocytopenia and a probable diagnosis of ITP, no further evaluation beyond routine history, physical examination, CBC, review of peripheral blood smear, testing for HIV and Hepatitis C is necessary.  Anti-platelet antibody studies are not routinely done and imaging studies and bone marrow aspiration and biopsy are not necessary unless other abnormalities are present.  The natural history of asymptomatic, mild thrombocytopenia was studied prospectively in 217 apparently healthy individuals referred to a hematology center for incidentally discovered platelet counts between 100,000 and 150,000/microL [27]. At six months of observation, 23 (11 percent) had normal platelet counts, two developed a myelodysplastic syndrome (refractory anemia), and one developed systemic lupus erythematosus. The remaining 191 individuals (88 percent) had persistent platelet counts <150,000/microL during this period without other signs of disease becoming evident; after five years, most (24 percent) had spontaneous resolution or persistent mild thrombocytopenia without development of an associated condition, supporting a  diagnosis of ITP.  Labs today: CBC diff, peripheral smear review, HIV screen (HIV 1/2 Ab+Ag).  Labs in 6 weeks: CBC diff  Labs in 12 weeks: CBC diff  Return in 12 weeks for follow-up.  If blood work over the next 12 weeks is stable, I would consider changing his frequency of blood checks to every 3 months with offices every 6 months 1 year. Platelet count remains stable, I would decrease blood checks and follow-ups even further. If her counts change, or  if platelet count worsens, I would consider screening for multiple myeloma (given his service in the [place] [employer] and unknown exposure to agent orange) and consider bone marrow aspiration biopsy. At this time, there is no indication for bone marrow aspiration and biopsy.  I presumably benign origin of his thrombocytopenia given its chronicity and stability over  3+ years.    2. CKD (chronic kidney disease), stage II Stable  3. Essential hypertension, benign Above goal at 155/83.  4. Fatty infiltration of liver Likely the cause of thrombocytopenia. Weight loss is recommended.   Final Result of Complexity      Choose decision making level with 2 or 3 checks OR choose the decision making level on Section B       A Number of diagnoses or treatment options  '[]'$   </= 1 Minimal  '[]'$   2 Limited  '[x]'$   3 Multiple  '[]'$   >/= 4 Extensive  B Amount and complexity of data  '[]'$   </= 1 Minimal or low  '[]'$   2 Limited  '[]'$   3  Moderate  '[x]'$   >/= 4 Extensive  C Highest risk  '[]'$   Minimal  '[]'$   Low  '[x]'$   Moderate  '[]'$   High   Type of decision making  '[]'$   Straight-forward  '[]'$   Low Complexity  '[]'$   Moderate- Complexity  '[x]'$   High- Complexity     ORDERS PLACED FOR THIS ENCOUNTER: Orders Placed This Encounter  Procedures  . CBC with Differential  . Pathologist smear review  . Rapid HIV screen (HIV 1/2 Ab+Ag)  . CBC with Differential  . CBC with Differential    MEDICATIONS PRESCRIBED THIS ENCOUNTER: No orders of the defined types were placed in this encounter.   THERAPY PLAN:  Will complete peripheral workup for thrombocytopenia. If platelet count worsens or there is a change in other cell lines, would consider screening for multiple myeloma and consider bone marrow aspiration and biopsy. Given the chronicity and stability of his thrombocytopenia, it is most likely a benign process.  All questions were answered. The patient knows to call the clinic with any problems, questions or concerns. We can certainly see the patient much sooner if necessary.  Patient and plan discussed with Dr. Twana First and she is in agreement with the aforementioned.   This note is electronically signed by: Doy Mince 02/10/2017 12:03 PM

## 2017-02-10 NOTE — Assessment & Plan Note (Addendum)
Thrombocytopenia dating back to at least 2015 with preservation of WBC and hemoglobin/RBC/HCT.  Peripheral work-up thus far has ruled out B12 and folate deficiency, copper deficiency, and hypersplenism (on ultrasound).  Hepatitis C testing recently was negative.  He does have fatty infiltration of liver which could be the culprit.  Given the chronicity of thrombocytopenia and other blood counts being normal, this is likely a benign process and its stability it promising.  Will complete peripheral work-up today.  Degrees of thrombocytopenia can be divided into mild (platelet count 100,000 to 150,000/microL), moderate (50,000 to 99,000/microL), and severe (<50,000/microL). Severe thrombocytopenia confers a greater risk of bleeding, but the correlation between platelet count and bleeding risk varies according to the underlying condition and may be unpredictable.  Asymptomatic, incidental findings, mild thrombocytopenia is commonly caused by immune thrombocytopenia (ITP), occult liver disease, HIV infection, and myelodysplastic syndromes.  Congenital thrombocytopenic conditions, sometimes misdiagnosed as ITP, may also occur.  In a patient with incidentally discovered asymptomatic thrombocytopenia and a probable diagnosis of ITP, no further evaluation beyond routine history, physical examination, CBC, review of peripheral blood smear, testing for HIV and Hepatitis C is necessary.  Anti-platelet antibody studies are not routinely done and imaging studies and bone marrow aspiration and biopsy are not necessary unless other abnormalities are present.  The natural history of asymptomatic, mild thrombocytopenia was studied prospectively in 217 apparently healthy individuals referred to a hematology center for incidentally discovered platelet counts between 100,000 and 150,000/microL [27]. At six months of observation, 23 (11 percent) had normal platelet counts, two developed a myelodysplastic syndrome (refractory  anemia), and one developed systemic lupus erythematosus. The remaining 191 individuals (88 percent) had persistent platelet counts <150,000/microL during this period without other signs of disease becoming evident; after five years, most (31 percent) had spontaneous resolution or persistent mild thrombocytopenia without development of an associated condition, supporting a diagnosis of ITP.  Labs today: CBC diff, peripheral smear review, HIV screen (HIV 1/2 Ab+Ag).  Labs in 6 weeks: CBC diff  Labs in 12 weeks: CBC diff  Return in 12 weeks for follow-up.  If blood work over the next 12 weeks is stable, I would consider changing his frequency of blood checks to every 3 months with offices every 6 months 1 year. Platelet count remains stable, I would decrease blood checks and follow-ups even further. If her counts change, or  if platelet count worsens, I would consider screening for multiple myeloma (given his service in the [place] [employer] and unknown exposure to agent orange) and consider bone marrow aspiration biopsy. At this time, there is no indication for bone marrow aspiration and biopsy.  I presumably benign origin of his thrombocytopenia given its chronicity and stability over 3+ years.

## 2017-02-15 LAB — PATHOLOGIST SMEAR REVIEW

## 2017-03-16 ENCOUNTER — Encounter: Payer: Self-pay | Admitting: Gastroenterology

## 2017-03-16 ENCOUNTER — Ambulatory Visit (INDEPENDENT_AMBULATORY_CARE_PROVIDER_SITE_OTHER): Payer: Medicare Other | Admitting: Gastroenterology

## 2017-03-16 DIAGNOSIS — K8500 Idiopathic acute pancreatitis without necrosis or infection: Secondary | ICD-10-CM | POA: Diagnosis not present

## 2017-03-16 DIAGNOSIS — K219 Gastro-esophageal reflux disease without esophagitis: Secondary | ICD-10-CM | POA: Diagnosis not present

## 2017-03-16 DIAGNOSIS — N189 Chronic kidney disease, unspecified: Secondary | ICD-10-CM | POA: Insufficient documentation

## 2017-03-16 DIAGNOSIS — K760 Fatty (change of) liver, not elsewhere classified: Secondary | ICD-10-CM

## 2017-03-16 DIAGNOSIS — N182 Chronic kidney disease, stage 2 (mild): Secondary | ICD-10-CM

## 2017-03-16 NOTE — Assessment & Plan Note (Signed)
SYMPTOMS CONTROLLED/RESOLVED.  CONTINUE OMEPRAZOLE.  TAKE 30 MINUTES PRIOR TO YOUR FIRST MEAL. AVOID TRIGGERS FOLLOW UP IN 1 YEAR.

## 2017-03-16 NOTE — Progress Notes (Signed)
cc'ed to pcp °

## 2017-03-16 NOTE — Assessment & Plan Note (Signed)
WEIGHT stable, BMI < 30, DEC 2018 NL HFP.  DRINK WATER TO KEEP YOUR URINE LIGHT YELLOW. FOLLOW A HIGH FIBER DIET. AVOID ITEMS THAT CAUSE BLOATING & GAS.  HANDOUT GIVEN. CONTINUE YOUR WEIGHT LOSS EFFORTS. LOSE 5-10 LBS. FOLLOW UP IN 1 YEAR.

## 2017-03-16 NOTE — Assessment & Plan Note (Signed)
SYMPTOMS CONTROLLED/RESOLVED. NO FLARES SINCE LAST OPV.  CONTINUE TO MONITOR SYMPTOMS. CALL WITH QUESTIONS OR CONCERNS.

## 2017-03-16 NOTE — Progress Notes (Signed)
On recall  °

## 2017-03-16 NOTE — Patient Instructions (Addendum)
DRINK WATER TO KEEP YOUR URINE LIGHT YELLOW.  FOLLOW A HIGH FIBER DIET. AVOID ITEMS THAT CAUSE BLOATING & GAS. SEE INFO BELOW.  CONTINUE YOUR WEIGHT LOSS EFFORTS. LOSE 5-10 LBS.  FOR PAIN RELIEF, TRY TURMERIC TEA ONCE OR TWICE DAILY.  FOLLOW UP IN 1 YEAR.    High-Fiber Diet A high-fiber diet changes your normal diet to include more whole grains, legumes, fruits, and vegetables. Changes in the diet involve replacing refined carbohydrates with unrefined foods. The calorie level of the diet is essentially unchanged. The Dietary Reference Intake (recommended amount) for adult males is 38 grams per day. For adult females, it is 25 grams per day. Pregnant and lactating women should consume 28 grams of fiber per day. Fiber is the intact part of a plant that is not broken down during digestion. Functional fiber is fiber that has been isolated from the plant to provide a beneficial effect in the body. PURPOSE  Increase stool bulk.   Ease and regulate bowel movements.   Lower cholesterol.   REDUCE RISK OF COLON CANCER  INDICATIONS THAT YOU NEED MORE FIBER  Constipation and hemorrhoids.   Uncomplicated diverticulosis (intestine condition) and irritable bowel syndrome.   Weight management.   As a protective measure against hardening of the arteries (atherosclerosis), diabetes, and cancer.   GUIDELINES FOR INCREASING FIBER IN THE DIET  Start adding fiber to the diet slowly. A gradual increase of about 5 more grams (2 slices of whole-wheat bread, 2 servings of most fruits or vegetables, or 1 bowl of high-fiber cereal) per day is best. Too rapid an increase in fiber may result in constipation, flatulence, and bloating.   Drink enough water and fluids to keep your urine clear or pale yellow. Water, juice, or caffeine-free drinks are recommended. Not drinking enough fluid may cause constipation.   Eat a variety of high-fiber foods rather than one type of fiber.   Try to increase your intake  of fiber through using high-fiber foods rather than fiber pills or supplements that contain small amounts of fiber.   The goal is to change the types of food eaten. Do not supplement your present diet with high-fiber foods, but replace foods in your present diet.  INCLUDE A VARIETY OF FIBER SOURCES  Replace refined and processed grains with whole grains, canned fruits with fresh fruits, and incorporate other fiber sources. White rice, white breads, and most bakery goods contain little or no fiber.   Brown whole-grain rice, buckwheat oats, and many fruits and vegetables are all good sources of fiber. These include: broccoli, Brussels sprouts, cabbage, cauliflower, beets, sweet potatoes, white potatoes (skin on), carrots, tomatoes, eggplant, squash, berries, fresh fruits, and dried fruits.   Cereals appear to be the richest source of fiber. Cereal fiber is found in whole grains and bran. Bran is the fiber-rich outer coat of cereal grain, which is largely removed in refining. In whole-grain cereals, the bran remains. In breakfast cereals, the largest amount of fiber is found in those with "bran" in their names. The fiber content is sometimes indicated on the label.   You may need to include additional fruits and vegetables each day.   In baking, for 1 cup white flour, you may use the following substitutions:   1 cup whole-wheat flour minus 2 tablespoons.   1/2 cup white flour plus 1/2 cup whole-wheat flour.

## 2017-03-16 NOTE — Assessment & Plan Note (Signed)
DISCUSSED NEED TO AVOID NSAIDs. PATIENT VOICED HIS UNDERSTANDING.   FOR PAIN RELIEF, TRY TURMERIC TEA ONCE OR TWICE DAILY.

## 2017-03-16 NOTE — Progress Notes (Signed)
Subjective:    Patient ID: Keith Keith., male    DOB: 09-07-1945, 72 y.o.   MRN: [id number]  Kirstie Peri, MD   HPI FEELS GOOD. AVOIDING CASHEWS. EATING BETTER. OFF FRIED FOODS. BMs: EVERY DAY, NL. HAS CRI. RARE ALEVE.   PT DENIES FEVER, CHILLS, HEMATOCHEZIA, HEMATEMESIS, nausea, vomiting, melena, diarrhea, CHEST PAIN, SHORTNESS OF BREATH, CHANGE IN BOWEL IN HABITS, constipation, abdominal pain, problems swallowing, OR heartburn or indigestion.  Past Medical History:  Diagnosis Date  . Arthritis   . CAD (coronary artery disease)    a. 05/19/13: DES x 2 to RCA, staged DES to OM 05/22/13, residual LAD/diag disease for Rx mgmt  b. 05/20/14 [place] s/p DES to LAD  . CKD (chronic kidney disease), stage II   . Essential hypertension, benign   . GERD (gastroesophageal reflux disease)   . Thrombocytopenia (HCC)    Past Surgical History:  Procedure Laterality Date  . Bilateral hip replacements    . CARDIAC CATHETERIZATION    . COLONOSCOPY W/ POLYPECTOMY  05/2016   COLON POLYPS REMOVED(>3) [facility]  . CORONARY ANGIOPLASTY WITH STENT PLACEMENT  05/20/2014   mid lad   . CORONARY STENT PLACEMENT  05/19/13  . FRACTIONAL FLOW RESERVE WIRE Right 05/22/2013   Procedure: FRACTIONAL FLOW RESERVE WIRE;  Surgeon: Kathleene Hazel, MD;  Location: [facility] CATH LAB;  Service: Cardiovascular;  Laterality: Right;  . LEFT HEART CATHETERIZATION WITH CORONARY ANGIOGRAM N/A 05/19/2013   Procedure: LEFT HEART CATHETERIZATION WITH CORONARY ANGIOGRAM;  Surgeon: Lennette Bihari, MD;  Location: [facility] CATH LAB;  Service: Cardiovascular;  Laterality: N/A;  . LEFT HEART CATHETERIZATION WITH CORONARY ANGIOGRAM N/A 05/20/2014   Procedure: LEFT HEART CATHETERIZATION WITH CORONARY ANGIOGRAM;  Surgeon: Kathleene Hazel, MD;  Location: [facility] CATH LAB;  Service: Cardiovascular;  Laterality: N/A;  . PERCUTANEOUS CORONARY STENT INTERVENTION (PCI-S) N/A 05/22/2013   Procedure: PERCUTANEOUS CORONARY STENT INTERVENTION (PCI-S);   Surgeon: Kathleene Hazel, MD;  Location: [facility] CATH LAB;  Service: Cardiovascular;  Laterality: N/A;   No Known Allergies  Current Outpatient Medications  Medication Sig Dispense Refill  . aspirin EC 81 MG tablet Take 81 mg by mouth daily.    Marland Kitchen atorvastatin (LIPITOR) 40 MG tablet Take 1 tablet (40 mg total) by mouth every evening.    . Cholecalciferol (VITAMIN D-3) 1000 UNITS CAPS Take 1,000 Units by mouth daily.     Marland Kitchen lisinopril-hydrochlorothiazide (PRINZIDE,ZESTORETIC) 20-12.5 MG tablet TAKE 1 TABLET DAILY    . metoprolol tartrate (LOPRESSOR) 25 MG tablet Take 25 mg by mouth 2 (two) times daily.    . nitroGLYCERIN (NITROSTAT) 0.4 MG SL tablet Place 1 tablet (0.4 mg total) under the tongue every 5 (five) minutes as needed for chest pain (up to 3 doses).    Marland Kitchen omeprazole (PRILOSEC) 40 MG capsule Take 1 capsule (40 mg total) by mouth at bedtime.    . ticagrelor (BRILINTA) 90 MG TABS tablet TAKE 1 TABLET TWICE A DAY (NEED APPOINTMENT FOR FURTHER REFILLS, OVERDUE FOR FOLLOW UP, WAS DUE THIS FEBRUARY)    . vitamin B-12 (CYANOCOBALAMIN) 1000 MCG tablet Take 1,000 mcg by mouth daily.     Review of Systems PER HPI OTHERWISE ALL SYSTEMS ARE NEGATIVE.    Objective:   Physical Exam  Constitutional: He is oriented to person, place, and time. He appears well-developed and well-nourished. No distress.  HENT:  Head: Normocephalic and atraumatic.  Mouth/Throat: Oropharynx is clear and moist. No oropharyngeal exudate.  Eyes: Pupils are equal, round,  and reactive to light. No scleral icterus.  Neck: Normal range of motion. Neck supple.  Cardiovascular: Normal rate, regular rhythm and normal heart sounds.  Pulmonary/Chest: Effort normal and breath sounds normal. No respiratory distress.  Abdominal: Soft. Bowel sounds are normal. He exhibits no distension. There is no tenderness.  Musculoskeletal: He exhibits no edema.  Lymphadenopathy:    He has no cervical adenopathy.  Neurological: He is alert  and oriented to person, place, and time.  Psychiatric: He has a normal mood and affect.  Vitals reviewed.     Assessment & Plan:

## 2017-03-24 ENCOUNTER — Other Ambulatory Visit (HOSPITAL_COMMUNITY): Payer: Medicare Other

## 2017-03-27 ENCOUNTER — Encounter (HOSPITAL_COMMUNITY): Payer: Medicare Other

## 2017-03-27 DIAGNOSIS — N183 Chronic kidney disease, stage 3 (moderate): Secondary | ICD-10-CM | POA: Diagnosis not present

## 2017-03-27 DIAGNOSIS — Z87891 Personal history of nicotine dependence: Secondary | ICD-10-CM | POA: Diagnosis not present

## 2017-03-27 DIAGNOSIS — Z299 Encounter for prophylactic measures, unspecified: Secondary | ICD-10-CM | POA: Diagnosis not present

## 2017-03-27 DIAGNOSIS — Z6827 Body mass index (BMI) 27.0-27.9, adult: Secondary | ICD-10-CM | POA: Diagnosis not present

## 2017-03-27 DIAGNOSIS — I2510 Atherosclerotic heart disease of native coronary artery without angina pectoris: Secondary | ICD-10-CM | POA: Diagnosis not present

## 2017-03-27 DIAGNOSIS — J069 Acute upper respiratory infection, unspecified: Secondary | ICD-10-CM | POA: Diagnosis not present

## 2017-03-27 DIAGNOSIS — I10 Essential (primary) hypertension: Secondary | ICD-10-CM | POA: Diagnosis not present

## 2017-03-30 ENCOUNTER — Inpatient Hospital Stay (HOSPITAL_COMMUNITY): Payer: Medicare Other | Attending: Oncology

## 2017-03-30 DIAGNOSIS — D696 Thrombocytopenia, unspecified: Secondary | ICD-10-CM | POA: Diagnosis not present

## 2017-03-30 DIAGNOSIS — I10 Essential (primary) hypertension: Secondary | ICD-10-CM | POA: Diagnosis not present

## 2017-03-30 DIAGNOSIS — E7800 Pure hypercholesterolemia, unspecified: Secondary | ICD-10-CM | POA: Diagnosis not present

## 2017-03-30 DIAGNOSIS — I2510 Atherosclerotic heart disease of native coronary artery without angina pectoris: Secondary | ICD-10-CM | POA: Diagnosis not present

## 2017-03-30 LAB — CBC WITH DIFFERENTIAL/PLATELET
BASOS PCT: 0 %
Basophils Absolute: 0 10*3/uL (ref 0.0–0.1)
Eosinophils Absolute: 0.2 10*3/uL (ref 0.0–0.7)
Eosinophils Relative: 3 %
HEMATOCRIT: 45 % (ref 39.0–52.0)
Hemoglobin: 14.8 g/dL (ref 13.0–17.0)
Lymphocytes Relative: 34 %
Lymphs Abs: 2.2 10*3/uL (ref 0.7–4.0)
MCH: 29.6 pg (ref 26.0–34.0)
MCHC: 32.9 g/dL (ref 30.0–36.0)
MCV: 90 fL (ref 78.0–100.0)
MONO ABS: 0.6 10*3/uL (ref 0.1–1.0)
MONOS PCT: 9 %
NEUTROS ABS: 3.4 10*3/uL (ref 1.7–7.7)
Neutrophils Relative %: 54 %
Platelets: 114 10*3/uL — ABNORMAL LOW (ref 150–400)
RBC: 5 MIL/uL (ref 4.22–5.81)
RDW: 13.3 % (ref 11.5–15.5)
WBC: 6.4 10*3/uL (ref 4.0–10.5)

## 2017-04-08 ENCOUNTER — Other Ambulatory Visit: Payer: Self-pay | Admitting: Cardiology

## 2017-04-25 NOTE — Progress Notes (Signed)
Cardiology Office Note  Date: 04/26/2017   ID: Asser Dail., DOB 08-14-1945, MRN [id number]  PCP: Kirstie Peri, MD  Primary Cardiologist: Nona Dell, MD   Chief Complaint  Patient presents with  . Coronary Artery Disease    History of Present Illness: Keith Keith. is a 72 y.o. male last seen in October 2018.  He presents for a follow-up visit.  States that he has had no significant angina symptoms to require nitroglycerin, no change in stamina.  We did discuss a follow-up Myoview study for reassessment of ischemic burden.  This is actually been scheduled for February, but he had to cancel the test due to an upper respiratory illness.  We are planning to reschedule this for April after he returns from a trip.  I reviewed his medications which are outlined below and stable from a cardiac perspective.  He reports no bleeding problems on dual antiplatelet therapy.  Past Medical History:  Diagnosis Date  . Arthritis   . CAD (coronary artery disease)    a. 05/19/13: DES x 2 to RCA, staged DES to OM 05/22/13, residual LAD/diag disease for Rx mgmt  b. 05/20/14 [place] s/p DES to LAD  . CKD (chronic kidney disease), stage II   . Essential hypertension, benign   . GERD (gastroesophageal reflux disease)   . Thrombocytopenia (HCC)     Past Surgical History:  Procedure Laterality Date  . Bilateral hip replacements    . CARDIAC CATHETERIZATION    . COLONOSCOPY W/ POLYPECTOMY  05/2016   COL[facility] POL[facility]PS REM[facility](>3) [facility]  . CORONARY ANGIOPLASTY WITH STENT PLACEMENT  05/20/2014   mid lad   . CORONARY STENT PLACEMENT  05/19/13  . FRACTIONAL FLOW RESERVE WIRE Right 05/22/2013   Procedure: FRACTIONAL FLOW RESERVE WIRE;  Surgeon: Kathleene Hazel, MD;  Location: [facility] CATH LAB;  Service: Cardiovascular;  Laterality: Right;  . LEFT HEART CATHETERIZATION WITH CORONARY ANGIOGRAM N/A 05/19/2013   Procedure: LEFT HEART CATHETERIZATION WITH CORONARY ANGIOGRAM;  Surgeon: Lennette Bihari,  MD;  Location: [facility] CATH LAB;  Service: Cardiovascular;  Laterality: N/A;  . LEFT HEART CATHETERIZATION WITH CORONARY ANGIOGRAM N/A 05/20/2014   Procedure: LEFT HEART CATHETERIZATION WITH CORONARY ANGIOGRAM;  Surgeon: Kathleene Hazel, MD;  Location: [facility] CATH LAB;  Service: Cardiovascular;  Laterality: N/A;  . PERCUTANEOUS CORONARY STENT INTERVENTION (PCI-S) N/A 05/22/2013   Procedure: PERCUTANEOUS CORONARY STENT INTERVENTION (PCI-S);  Surgeon: Kathleene Hazel, MD;  Location: [facility] CATH LAB;  Service: Cardiovascular;  Laterality: N/A;    Current Outpatient Medications  Medication Sig Dispense Refill  . aspirin EC 81 MG tablet Take 81 mg by mouth daily.    Marland Kitchen atorvastatin (LIPITOR) 40 MG tablet Take 1 tablet (40 mg total) by mouth every evening. 30 tablet 6  . BRILINTA 90 MG TABS tablet TAKE 1 TABLET TWICE A DAY (NEED APPOINTMENT FOR REFILLS, OVERDUE FOR FOLLOW UP, WAS DUE THIS FEBRUARY) 180 tablet 0  . Cholecalciferol (VITAMIN D-3) 1000 UNITS CAPS Take 1,000 Units by mouth daily.     Marland Kitchen lisinopril-hydrochlorothiazide (PRINZIDE,ZESTORETIC) 20-12.5 MG tablet TAKE 1 TABLET DAILY 90 tablet 1  . metoprolol tartrate (LOPRESSOR) 25 MG tablet Take 25 mg by mouth 2 (two) times daily.    . nitroGLYCERIN (NITROSTAT) 0.4 MG SL tablet Place 1 tablet (0.4 mg total) under the tongue every 5 (five) minutes as needed for chest pain (up to 3 doses). 75 tablet 1  . omeprazole (PRILOSEC) 40 MG capsule Take 1 capsule (40  mg total) by mouth at bedtime. 30 capsule 6  . vitamin B-12 (CYANOCOBALAMIN) 1000 MCG tablet Take 1,000 mcg by mouth daily.     No current facility-administered medications for this visit.    Allergies:  Patient has no known allergies.   Social History: The patient  reports that he quit smoking about 15 years ago. His smoking use included cigarettes. He started smoking about 51 years ago. He has a 38.00 pack-year smoking history. he has never used smokeless tobacco. He reports that he drinks  alcohol. He reports that he does not use drugs.   ROS:  Please see the history of present illness. Otherwise, complete review of systems is positive for none.  All other systems are reviewed and negative.   Physical Exam: VS:  BP (!) 148/88   Pulse 64   Ht 5\' 5"  (1.651 m)   Wt 164 lb (74.4 kg)   SpO2 98%   BMI 27.29 kg/m , BMI Body mass index is 27.29 kg/m.  Wt Readings from Last 3 Encounters:  04/26/17 164 lb (74.4 kg)  03/16/17 162 lb 6.4 oz (73.7 kg)  02/10/17 165 lb (74.8 kg)    General: Patient appears comfortable at rest. HEENT: Conjunctiva and lids normal, oropharynx clear. Neck: Supple, no elevated JVP or carotid bruits, no thyromegaly. Lungs: Clear to auscultation, nonlabored breathing at rest. Cardiac: Regular rate and rhythm, no S3 or significant systolic murmur, no pericardial rub. Abdomen: Soft, nontender, bowel sounds present. Extremities: No pitting edema, distal pulses 2+. Skin: Warm and dry. Musculoskeletal: No kyphosis. Neuropsychiatric: Alert and oriented x3, affect grossly appropriate.  ECG: I personally reviewed the tracing from 12/01/2016 which showed sinus rhythm with nonspecific ST-T changes.  Recent Labwork: 01/27/2017: ALT 17; AST 27; BUN 15; Creatinine, Ser 1.82; Potassium 3.8; Sodium 135 03/30/2017: Hemoglobin 14.8; Platelets 114     Component Value Date/Time   CHOL 128 05/20/2013 0047   TRIG 92 05/20/2013 0047   HDL 42 05/20/2013 0047   CHOLHDL 3.0 05/20/2013 0047   VLDL 18 05/20/2013 0047   LDLCALC 68 05/20/2013 0047    Other Studies Reviewed Today:  Echocardiogram 05/20/2014: Study Conclusions  - Left ventricle: The cavity size was normal. Wall thickness was increased in a pattern of moderate LVH. Systolic function was normal. The estimated ejection fraction was in the range of 60% to 65%. Wall motion was normal; there were no regional wall motion abnormalities. Doppler parameters are consistent with abnormal left ventricular  relaxation (grade 1 diastolic dysfunction). The E/e&' ratio is between 8-15, suggesting indeterminate LV filling pressure. - Aortic valve: Sclerosis without stenosis. There was no regurgitation. - Mitral valve: There was no significant regurgitation. - Left atrium: The atrium was normal in size. - Right ventricle: The cavity size was normal. Wall thickness was normal. Systolic function is reduced. - Right atrium: The atrium was normal in size.  Impressions:  - LVEF 60-65%, moderate LVH, diastolic dysfunction, indeterminate (but likely elevated LV Filling pressure), normal LA size, mildly reduced RV function by TAPSE, no significant TR  Assessment and Plan:  1.  CAD status post DES x2 to the RCA with staged DES to the OM in 2015 followed by DES to the LAD in 2016.  We are keeping him on dual antiplatelet therapy long-term.  The plan is to follow-up on Myoview study in April as discussed above to reassess ischemic burden.  2.  Mixed hyperlipidemia, he continues on statin therapy.  He continues to follow with Dr. Sherryll Burger for lab  work.   3.  CKD stage III, creatinine 1.8 in December 2018.  4.  Relatively mild thrombocytopenia, asymptomatic.  Current medicines were reviewed with the patient today.   Orders Placed This Encounter  Procedures  . NM Myocar Multi W/Spect W/Wall Motion / EF    Disposition: Follow-up in 6 months.  Signed, Jonelle Sidle, MD, [facility] 04/26/2017 8:57 AM    [facility] [address] [place], [place], [place] [address] Phone: [phone]; Fax: [phone]

## 2017-04-26 ENCOUNTER — Ambulatory Visit (INDEPENDENT_AMBULATORY_CARE_PROVIDER_SITE_OTHER): Payer: Medicare Other | Admitting: Cardiology

## 2017-04-26 ENCOUNTER — Telehealth: Payer: Self-pay | Admitting: Cardiology

## 2017-04-26 ENCOUNTER — Encounter: Payer: Self-pay | Admitting: Cardiology

## 2017-04-26 VITALS — BP 148/88 | HR 64 | Ht 65.0 in | Wt 164.0 lb

## 2017-04-26 DIAGNOSIS — E782 Mixed hyperlipidemia: Secondary | ICD-10-CM | POA: Diagnosis not present

## 2017-04-26 DIAGNOSIS — N183 Chronic kidney disease, stage 3 unspecified: Secondary | ICD-10-CM

## 2017-04-26 DIAGNOSIS — I25119 Atherosclerotic heart disease of native coronary artery with unspecified angina pectoris: Secondary | ICD-10-CM

## 2017-04-26 DIAGNOSIS — D696 Thrombocytopenia, unspecified: Secondary | ICD-10-CM | POA: Diagnosis not present

## 2017-04-26 NOTE — Telephone Encounter (Signed)
Pre-cert Verification for the following procedure   Lexiscan scheduled for 05/25/2017 at [facility]

## 2017-04-26 NOTE — Patient Instructions (Signed)
Medication Instructions:  Your physician recommends that you continue on your current medications as directed. Please refer to the Current Medication list given to you today.  Labwork: NONE  Testing/Procedures: Your physician has requested that you have a lexiscan myoview. For further information please visit [email or web address]. Please follow instruction sheet, as given.  Follow-Up: Your physician wants you to follow-up in: 6 MONTHS WITH DR. MCDOWELL. You will receive a reminder letter in the mail two months in advance. If you don't receive a letter, please call our office to schedule the follow-up appointment.  Any Other Special Instructions Will Be Listed Below (If Applicable).  If you need a refill on your cardiac medications before your next appointment, please call your pharmacy. 

## 2017-05-02 ENCOUNTER — Encounter (HOSPITAL_COMMUNITY): Payer: Medicare Other

## 2017-05-05 ENCOUNTER — Other Ambulatory Visit (HOSPITAL_COMMUNITY): Payer: Medicare Other

## 2017-05-05 ENCOUNTER — Inpatient Hospital Stay (HOSPITAL_COMMUNITY): Payer: Medicare Other | Attending: Adult Health

## 2017-05-05 DIAGNOSIS — K760 Fatty (change of) liver, not elsewhere classified: Secondary | ICD-10-CM | POA: Diagnosis not present

## 2017-05-05 DIAGNOSIS — Z79899 Other long term (current) drug therapy: Secondary | ICD-10-CM | POA: Insufficient documentation

## 2017-05-05 DIAGNOSIS — M1990 Unspecified osteoarthritis, unspecified site: Secondary | ICD-10-CM | POA: Diagnosis not present

## 2017-05-05 DIAGNOSIS — Z7982 Long term (current) use of aspirin: Secondary | ICD-10-CM | POA: Diagnosis not present

## 2017-05-05 DIAGNOSIS — I129 Hypertensive chronic kidney disease with stage 1 through stage 4 chronic kidney disease, or unspecified chronic kidney disease: Secondary | ICD-10-CM | POA: Insufficient documentation

## 2017-05-05 DIAGNOSIS — D696 Thrombocytopenia, unspecified: Secondary | ICD-10-CM | POA: Insufficient documentation

## 2017-05-05 DIAGNOSIS — I25110 Atherosclerotic heart disease of native coronary artery with unstable angina pectoris: Secondary | ICD-10-CM | POA: Insufficient documentation

## 2017-05-05 DIAGNOSIS — Z87891 Personal history of nicotine dependence: Secondary | ICD-10-CM | POA: Diagnosis not present

## 2017-05-05 DIAGNOSIS — N182 Chronic kidney disease, stage 2 (mild): Secondary | ICD-10-CM | POA: Insufficient documentation

## 2017-05-05 DIAGNOSIS — K219 Gastro-esophageal reflux disease without esophagitis: Secondary | ICD-10-CM | POA: Diagnosis not present

## 2017-05-05 LAB — CBC WITH DIFFERENTIAL/PLATELET
Basophils Absolute: 0 10*3/uL (ref 0.0–0.1)
Basophils Relative: 1 %
Eosinophils Absolute: 0.2 10*3/uL (ref 0.0–0.7)
Eosinophils Relative: 4 %
HEMATOCRIT: 43.4 % (ref 39.0–52.0)
HEMOGLOBIN: 14 g/dL (ref 13.0–17.0)
LYMPHS PCT: 42 %
Lymphs Abs: 2.1 10*3/uL (ref 0.7–4.0)
MCH: 29.2 pg (ref 26.0–34.0)
MCHC: 32.3 g/dL (ref 30.0–36.0)
MCV: 90.6 fL (ref 78.0–100.0)
MONOS PCT: 8 %
Monocytes Absolute: 0.4 10*3/uL (ref 0.1–1.0)
NEUTROS PCT: 45 %
Neutro Abs: 2.2 10*3/uL (ref 1.7–7.7)
Platelets: 108 10*3/uL — ABNORMAL LOW (ref 150–400)
RBC: 4.79 MIL/uL (ref 4.22–5.81)
RDW: 13.3 % (ref 11.5–15.5)
WBC: 4.9 10*3/uL (ref 4.0–10.5)

## 2017-05-12 ENCOUNTER — Encounter (HOSPITAL_COMMUNITY): Payer: Self-pay | Admitting: Internal Medicine

## 2017-05-12 ENCOUNTER — Inpatient Hospital Stay (HOSPITAL_BASED_OUTPATIENT_CLINIC_OR_DEPARTMENT_OTHER): Payer: Medicare Other | Admitting: Internal Medicine

## 2017-05-12 VITALS — BP 153/80 | HR 76 | Temp 97.9°F | Resp 16 | Wt 163.5 lb

## 2017-05-12 DIAGNOSIS — N182 Chronic kidney disease, stage 2 (mild): Secondary | ICD-10-CM | POA: Diagnosis not present

## 2017-05-12 DIAGNOSIS — M1990 Unspecified osteoarthritis, unspecified site: Secondary | ICD-10-CM

## 2017-05-12 DIAGNOSIS — K219 Gastro-esophageal reflux disease without esophagitis: Secondary | ICD-10-CM

## 2017-05-12 DIAGNOSIS — K760 Fatty (change of) liver, not elsewhere classified: Secondary | ICD-10-CM | POA: Diagnosis not present

## 2017-05-12 DIAGNOSIS — I129 Hypertensive chronic kidney disease with stage 1 through stage 4 chronic kidney disease, or unspecified chronic kidney disease: Secondary | ICD-10-CM

## 2017-05-12 DIAGNOSIS — I25110 Atherosclerotic heart disease of native coronary artery with unstable angina pectoris: Secondary | ICD-10-CM | POA: Diagnosis not present

## 2017-05-12 DIAGNOSIS — Z87891 Personal history of nicotine dependence: Secondary | ICD-10-CM | POA: Diagnosis not present

## 2017-05-12 DIAGNOSIS — Z7982 Long term (current) use of aspirin: Secondary | ICD-10-CM

## 2017-05-12 DIAGNOSIS — Z79899 Other long term (current) drug therapy: Secondary | ICD-10-CM | POA: Diagnosis not present

## 2017-05-12 DIAGNOSIS — D696 Thrombocytopenia, unspecified: Secondary | ICD-10-CM | POA: Diagnosis not present

## 2017-05-12 NOTE — Patient Instructions (Addendum)
[facility] at [facility] Discharge Instructions  You were seen today Dr. Melton Alar. She went over why you are being seen which is low platelet counts. She went over your recent lab results and everything but the platelet count looked good. If you noticed any bruising or bleeding please call our clinic. We will see you back in 6 months for labs and follow up.   Thank you for choosing [facility] at [facility] to provide your oncology and hematology care.  To afford each patient quality time with our provider, please arrive at least 15 minutes before your scheduled appointment time.   If you have a lab appointment with the Cancer Center please come in thru the  Main Entrance and check in at the main information desk  You need to re-schedule your appointment should you arrive 10 or more minutes late.  We strive to give you quality time with our providers, and arriving late affects you and other patients whose appointments are after yours.  Also, if you no show three or more times for appointments you may be dismissed from the clinic at the providers discretion.     Again, thank you for choosing [facility].  Our hope is that these requests will decrease the amount of time that you wait before being seen by our physicians.       _____________________________________________________________  Should you have questions after your visit to [facility], please contact our office at [phone] between the hours of 8:30 a.m. and 4:30 p.m.  Voicemails left after 4:30 p.m. will not be returned until the following business day.  For prescription refill requests, have your pharmacy contact our office.       Resources For Cancer Patients and their Caregivers ? American Cancer Society: Can assist with transportation, wigs, general needs, runs Look Good Feel Better.        317-653-4114 ? Cancer Care: Provides financial assistance,  online support groups, medication/co-pay assistance.  1-800-813-HOPE 812-701-0800) ? [facility] Assists [place] cancer patients and their families through emotional , educational and financial support.  (639)586-9919 ? [place] DSS Where to apply for food stamps, Medicaid and utility assistance. 380-174-6813 ? [facility]: Transportation to medical appointments. 518-233-0380 ? Social Security Administration: May apply for disability if have a Stage IV cancer. (412) 528-6191 740-003-5759 ? [employer], Disability and Transit Services: Assists with nutrition, care and transit needs. 859-033-6291  Cancer Center Support Programs:   > Cancer Support Group  2nd Tuesday of the month 1pm-2pm, Journey Room   > Creative Journey  3rd Tuesday of the month 1130am-1pm, Journey Room

## 2017-05-25 ENCOUNTER — Encounter (HOSPITAL_COMMUNITY): Payer: Medicare Other

## 2017-05-25 ENCOUNTER — Other Ambulatory Visit (HOSPITAL_COMMUNITY): Payer: Medicare Other

## 2017-07-09 ENCOUNTER — Other Ambulatory Visit: Payer: Self-pay | Admitting: Cardiology

## 2017-07-14 ENCOUNTER — Encounter (HOSPITAL_COMMUNITY): Payer: Self-pay | Admitting: Emergency Medicine

## 2017-07-14 ENCOUNTER — Emergency Department (HOSPITAL_COMMUNITY)
Admission: EM | Admit: 2017-07-14 | Discharge: 2017-07-15 | Disposition: A | Discharge status: Home / Self Care | Payer: Medicare Other | Attending: Emergency Medicine | Admitting: Emergency Medicine

## 2017-07-14 ENCOUNTER — Other Ambulatory Visit: Payer: Self-pay

## 2017-07-14 ENCOUNTER — Emergency Department (HOSPITAL_COMMUNITY): Payer: Medicare Other

## 2017-07-14 DIAGNOSIS — R079 Chest pain, unspecified: Secondary | ICD-10-CM | POA: Insufficient documentation

## 2017-07-14 DIAGNOSIS — Z79899 Other long term (current) drug therapy: Secondary | ICD-10-CM | POA: Diagnosis not present

## 2017-07-14 DIAGNOSIS — Z955 Presence of coronary angioplasty implant and graft: Secondary | ICD-10-CM | POA: Diagnosis not present

## 2017-07-14 DIAGNOSIS — Z87891 Personal history of nicotine dependence: Secondary | ICD-10-CM | POA: Diagnosis not present

## 2017-07-14 DIAGNOSIS — I2510 Atherosclerotic heart disease of native coronary artery without angina pectoris: Secondary | ICD-10-CM | POA: Insufficient documentation

## 2017-07-14 DIAGNOSIS — N182 Chronic kidney disease, stage 2 (mild): Secondary | ICD-10-CM | POA: Diagnosis not present

## 2017-07-14 DIAGNOSIS — Z7982 Long term (current) use of aspirin: Secondary | ICD-10-CM | POA: Insufficient documentation

## 2017-07-14 DIAGNOSIS — I129 Hypertensive chronic kidney disease with stage 1 through stage 4 chronic kidney disease, or unspecified chronic kidney disease: Secondary | ICD-10-CM | POA: Diagnosis not present

## 2017-07-14 DIAGNOSIS — Z8679 Personal history of other diseases of the circulatory system: Secondary | ICD-10-CM | POA: Insufficient documentation

## 2017-07-14 DIAGNOSIS — R0789 Other chest pain: Secondary | ICD-10-CM | POA: Diagnosis not present

## 2017-07-14 LAB — I-STAT TROPONIN, ED: Troponin i, poc: 0.01 ng/mL (ref 0.00–0.08)

## 2017-07-14 NOTE — ED Notes (Signed)
Pt denies pain at this time

## 2017-07-14 NOTE — ED Triage Notes (Signed)
Patient reports chest pain that started today. Has vomited twice. Dizziness and SOB "somewhat" per patient.

## 2017-07-15 DIAGNOSIS — R079 Chest pain, unspecified: Secondary | ICD-10-CM | POA: Diagnosis not present

## 2017-07-15 LAB — CBC
HCT: 44 % (ref 39.0–52.0)
Hemoglobin: 14.6 g/dL (ref 13.0–17.0)
MCH: 29.7 pg (ref 26.0–34.0)
MCHC: 33.2 g/dL (ref 30.0–36.0)
MCV: 89.4 fL (ref 78.0–100.0)
PLATELETS: 114 10*3/uL — AB (ref 150–400)
RBC: 4.92 MIL/uL (ref 4.22–5.81)
RDW: 13.4 % (ref 11.5–15.5)
WBC: 6.8 10*3/uL (ref 4.0–10.5)

## 2017-07-15 LAB — BASIC METABOLIC PANEL
Anion gap: 10 (ref 5–15)
BUN: 15 mg/dL (ref 6–20)
CALCIUM: 9.7 mg/dL (ref 8.9–10.3)
CO2: 27 mmol/L (ref 22–32)
CREATININE: 1.73 mg/dL — AB (ref 0.61–1.24)
Chloride: 99 mmol/L — ABNORMAL LOW (ref 101–111)
GFR calc non Af Amer: 38 mL/min — ABNORMAL LOW (ref >60)
GFR, EST AFRICAN AMERICAN: 44 mL/min — AB (ref >60)
Glucose, Bld: 134 mg/dL — ABNORMAL HIGH (ref 65–99)
Potassium: 3.7 mmol/L (ref 3.5–5.1)
Sodium: 136 mmol/L (ref 135–145)

## 2017-07-15 LAB — TROPONIN I: Troponin I: <0.03 ng/mL

## 2017-07-15 NOTE — Discharge Instructions (Addendum)
Continue your medications as previously prescribed.  Aloe up with your cardiologist next week, and return to the emergency department if symptoms worsen or change.

## 2017-07-15 NOTE — ED Provider Notes (Signed)
[facility] EMERGENCY DEPARTMENT Provider Note   CSN: [id number] Arrival date & time: 07/14/17  2326     History   Chief Complaint Chief Complaint  Patient presents with  . Chest Pain    HPI Keith Scelfo. is a 72 y.o. male.  This patient is a 72 year old male with past medical history of coronary artery disease with multiple stents.  He presents today for evaluation of chest discomfort.  He reports sitting at home when he developed twinges of discomfort to his left lateral chest wall.  These lasted for several minutes, then resolved.  He denies feeling short of breath, but does report feeling nauseated.  He denies any radiation to his arm or jaw.  He states this feels not unlike what he experienced with his prior cardiac pain.  The history is provided by the patient.  Chest Pain   This is a new problem. Episode onset: This evening. Episode frequency: Intermittently. The problem has been resolved. The quality of the pain is described as pressure-like. The pain does not radiate.    Past Medical History:  Diagnosis Date  . Arthritis   . CAD (coronary artery disease)    a. 05/19/13: DES x 2 to RCA, staged DES to OM 05/22/13, residual LAD/diag disease for Rx mgmt  b. 05/20/14 [place] s/p DES to LAD  . CKD (chronic kidney disease), stage II   . Essential hypertension, benign   . GERD (gastroesophageal reflux disease)   . Thrombocytopenia [facility])     Patient Active Problem List   Diagnosis Date Noted  . Fatty infiltration of liver 02/10/2017  . Pancreatitis, acute 12/29/2016  . Atypical chest pain 11/07/2014  . Chest pain 11/07/2014  . GERD (gastroesophageal reflux disease)   . CAD (coronary artery disease)   . Unstable angina (HCC) 05/20/2014  . CKD (chronic kidney disease), stage II 05/23/2013  . Thrombocytopenia (HCC)   . Essential hypertension, benign 05/19/2013    Past Surgical History:  Procedure Laterality Date  . Bilateral hip replacements    . CARDIAC  CATHETERIZATION    . COLONOSCOPY W/ POLYPECTOMY  05/2016   COL[facility] POL[facility]PS REMOVED(>3) [facility]  . CORONARY ANGIOPLASTY WITH STENT PLACEMENT  05/20/2014   mid lad   . CORONARY STENT PLACEMENT  05/19/13  . FRACTIONAL FLOW RESERVE WIRE Right 05/22/2013   Procedure: FRACTIONAL FLOW RESERVE WIRE;  Surgeon: Kathleene Hazel, MD;  Location: [facility] CATH LAB;  Service: Cardiovascular;  Laterality: Right;  . LEFT HEART CATHETERIZATION WITH CORONARY ANGIOGRAM N/A 05/19/2013   Procedure: LEFT HEART CATHETERIZATION WITH CORONARY ANGIOGRAM;  Surgeon: Lennette Bihari, MD;  Location: [facility] CATH LAB;  Service: Cardiovascular;  Laterality: N/A;  . LEFT HEART CATHETERIZATION WITH CORONARY ANGIOGRAM N/A 05/20/2014   Procedure: LEFT HEART CATHETERIZATION WITH CORONARY ANGIOGRAM;  Surgeon: Kathleene Hazel, MD;  Location: [facility] CATH LAB;  Service: Cardiovascular;  Laterality: N/A;  . PERCUTANEOUS CORONARY STENT INTERVENTION (PCI-S) N/A 05/22/2013   Procedure: PERCUTANEOUS CORONARY STENT INTERVENTION (PCI-S);  Surgeon: Kathleene Hazel, MD;  Location: [facility] CATH LAB;  Service: Cardiovascular;  Laterality: N/A;        Home Medications    Prior to Admission medications   Medication Sig Start Date End Date Taking? Authorizing Provider  aspirin EC 81 MG tablet Take 81 mg by mouth daily.    [provider]  atorvastatin (LIPITOR) 40 MG tablet Take 1 tablet (40 mg total) by mouth every evening. 05/23/13   Laurann Montana, PA-C  Marden Noble  90 MG TABS tablet TAKE 1 TABLET TWICE A DAY (NEED APPOINTMENT FOR REFILLS, OVERDUE FOR FOLLOW UP, WAS DUE THIS FEBRUARY) 07/10/17   Jonelle Sidle, MD  Cholecalciferol (VITAMIN D-3) 1000 UNITS CAPS Take 1,000 Units by mouth daily.     [provider]  lisinopril-hydrochlorothiazide (PRINZIDE,ZESTORETIC) 20-12.5 MG tablet TAKE 1 TABLET DAILY 05/29/15   Antoine Poche, MD  metoprolol tartrate (LOPRESSOR) 25 MG tablet Take 25 mg by mouth 2 (two) times daily.    [provider]  nitroGLYCERIN (NITROSTAT) 0.4 MG SL tablet Place 1 tablet (0.4 mg total) under the tongue every 5 (five) minutes as needed for chest pain (up to 3 doses). 01/20/16   Jonelle Sidle, MD  omeprazole (PRILOSEC) 40 MG capsule Take 1 capsule (40 mg total) by mouth at bedtime. 11/07/14   Elliot Cousin, MD  vitamin B-12 (CYANOCOBALAMIN) 1000 MCG tablet Take 1,000 mcg by mouth daily.    [provider]    Family History Family History  Problem Relation Age of Onset  . Aneurysm Mother   . Heart disease Sister   . Diabetes Sister   . Diabetes Sister   . Colon polyps Sister   . Colon cancer Neg Hx     Social History Social History   Tobacco Use  . Smoking status: Former Smoker    Packs/day: 1.00    Years: 38.00    Pack years: 38.00    Types: Cigarettes    Start date: 10/16/1965    Last attempt to quit: 02/21/2002    Years since quitting: 15.4  . Smokeless tobacco: Never Used  Substance Use Topics  . Alcohol use: Yes    Comment: occ beer  . Drug use: No     Allergies   Patient has no known allergies.   Review of Systems Review of Systems  Cardiovascular: Positive for chest pain.  All other systems reviewed and are negative.    Physical Exam Updated Vital Signs BP 129/75   Pulse (!) 51   Temp 98.2 F (36.8 C) (Oral)   Resp 15   Ht 5\' 5"  (1.651 m)   Wt 71.2 kg (157 lb)   SpO2 98%   BMI 26.13 kg/m   Physical Exam  Constitutional: He is oriented to person, place, and time. He appears well-developed and well-nourished. No distress.  HENT:  Head: Normocephalic and atraumatic.  Mouth/Throat: Oropharynx is clear and moist.  Neck: Normal range of motion. Neck supple.  Cardiovascular: Normal rate and regular rhythm. Exam reveals no friction rub.  No murmur heard. Pulmonary/Chest: Effort normal and breath sounds normal. No respiratory distress. He has no wheezes. He has no rales.  Abdominal: Soft. Bowel sounds are normal. He exhibits no  distension. There is no tenderness.  Musculoskeletal: Normal range of motion. He exhibits no edema.  Neurological: He is alert and oriented to person, place, and time. Coordination normal.  Skin: Skin is warm and dry. He is not diaphoretic.  Nursing note and vitals reviewed.    ED Treatments / Results  Labs (all labs ordered are listed, but only abnormal results are displayed) Labs Reviewed  BASIC METABOLIC PANEL - Abnormal; Notable for the following components:      Result Value   Chloride 99 (*)    Glucose, Bld 134 (*)    Creatinine, Ser 1.73 (*)    GFR calc non Af Amer 38 (*)    GFR calc Af Amer 44 (*)    All other components  within normal limits  CBC - Abnormal; Notable for the following components:   Platelets 114 (*)    All other components within normal limits  TROPONIN I  I-STAT TROPONIN, ED    EKG EKG Interpretation  Date/Time:  Friday Jul 14 2017 23:34:29 EDT Ventricular Rate:  60 PR Interval:    QRS Duration: 98 QT Interval:  422 QTC Calculation: 422 R Axis:   81 Text Interpretation:  Sinus rhythm Borderline right axis deviation Borderline low voltage, extremity leads Confirmed by Geoffery Lyons ([id number]) on 07/15/2017 12:22:45 AM   Radiology Dg Chest 2 View  Result Date: 07/15/2017 CLINICAL DATA:  Chest pain EXAM: CHEST - 2 VIEW COMPARISON:  12/01/2016 FINDINGS: The heart size and mediastinal contours are within normal limits. Both lungs are clear. Chronic wedging of lower thoracic vertebra. Probable nipple shadow left lower lung. IMPRESSION: No active cardiopulmonary disease. Probable left lower lung nipple shadow, short interval follow-up radiograph with nipple markers suggested. Electronically Signed   By: Jasmine Pang M.D.   On: 07/15/2017 00:42    Procedures Procedures (including critical care time)  Medications Ordered in ED Medications - No data to display   Initial Impression / Assessment and Plan / ED Course  I have reviewed the triage vital  signs and the nursing notes.  Pertinent labs & imaging results that were available during my care of the patient were reviewed by me and considered in my medical decision making (see chart for details).  Since work-up reveals an unchanged EKG and negative troponin x2.  His symptoms are somewhat atypical for cardiac pain, but he does state that this is somewhat similar to what he experienced with his prior heart problems.  It was my recommendation that we admit for observation and further work-up, however the patient is unwilling to do this.  He states that he will follow-up with Dr. Diona Browner, his cardiologist next week and return if things worsen or change.  I have explained to him that the negative work-up does not rule out his heart as the culprit and that leaving does pose a risk to his well-being.  He understands this and is willing to accept these risks.  Final Clinical Impressions(s) / ED Diagnoses   Final diagnoses:  None    ED Discharge Orders    None       Geoffery Lyons, MD 07/15/17 [phone]

## 2017-07-27 DIAGNOSIS — I10 Essential (primary) hypertension: Secondary | ICD-10-CM | POA: Diagnosis not present

## 2017-07-27 DIAGNOSIS — I2510 Atherosclerotic heart disease of native coronary artery without angina pectoris: Secondary | ICD-10-CM | POA: Diagnosis not present

## 2017-07-27 DIAGNOSIS — E7800 Pure hypercholesterolemia, unspecified: Secondary | ICD-10-CM | POA: Diagnosis not present

## 2017-07-31 NOTE — Progress Notes (Signed)
Diagnosis Thrombocytopenia (HCC) - Plan: CBC with Differential/Platelet, Comprehensive metabolic panel, Lactate dehydrogenase  Staging Cancer Staging No matching staging information was found for the patient.  Assessment and Plan:  1.  Thrombocytopenia (HCC).  Pt was previously followed by Dr. Janyth Contes.  Pt had low platelets dating back to at least 2015 with preservation of WBC and hemoglobin/RBC/HCT.  Peripheral work-up has ruled out B12 and folate deficiency, copper deficiency, and hypersplenism (on ultrasound).  Hepatitis C and HIV testing was negative.  He does have fatty infiltration of liver which could be the culprit.  Pt has remained on observation.    Labs done 05/05/2017 and reviewed with the pt show WBC 4.9, HB 14 and plts 108,000.  Plts have remained greater than 100,000.  He will have repeat labs in 10/2017 and will follow-up to go over results.    2. CKD (chronic kidney disease), stage II.  Recent Cr 1.82.  Will repeat chemistries on RTC.   3. HTN.  BP 148/88.  Follow-up with PCP.    4. Fatty Liver.  This was noted on USN done 02/02/2017.  May also play a role in thrombocytopenia.  Will repeat chemistries on RTC.    Interval History:  72 y.o. male previously followed by Dr. Janyth Contes due to chronic thrombocytopenia. CBC from 12/01/2016 demonstrated WBC 5.8K, hemoglobin 14.7 g/dL, hematocrit 65.9%, platelet count 97K.  Review of his CBCs for the past 2 years have demonstrated that his platelet count has ranged between 80 4K to 113 K.  His WBC and hemoglobin of always been normal.  Patient is completely asymptomatic and has not had any problems with bleeding or bruising.  Pt was suspected of having benign  thrombocytopenia given its chronicity and stability over 3+ years.   Current Status:  Pt is seen today for follow-up.  He is here to go over labs.    Problem List Patient Active Problem List   Diagnosis Date Noted  . Fatty infiltration of liver [K76.0] 02/10/2017  . Pancreatitis, acute  [K85.90] 12/29/2016  . Atypical chest pain [R07.89] 11/07/2014  . Chest pain [R07.9] 11/07/2014  . GERD (gastroesophageal reflux disease) [K21.9]   . CAD (coronary artery disease) [I25.10]   . Unstable angina (HCC) [I20.0] 05/20/2014  . CKD (chronic kidney disease), stage II [N18.2] 05/23/2013  . Thrombocytopenia (HCC) [D69.6]   . Essential hypertension, benign [I10] 05/19/2013    Past Medical History Past Medical History:  Diagnosis Date  . Arthritis   . CAD (coronary artery disease)    a. 05/19/13: DES x 2 to RCA, staged DES to OM 05/22/13, residual LAD/diag disease for Rx mgmt  b. 05/20/14 [place] s/p DES to LAD  . CKD (chronic kidney disease), stage II   . Essential hypertension, benign   . GERD (gastroesophageal reflux disease)   . Thrombocytopenia [facility])     Past Surgical History Past Surgical History:  Procedure Laterality Date  . Bilateral hip replacements    . CARDIAC CATHETERIZATION    . COLONOSCOPY W/ POLYPECTOMY  05/2016   [facility] POL[facility]PS [facility](>3[facility]  . CORONARY ANGIOPLASTY WITH STENT PLACEMENT  05/20/2014   mid lad   . CORONARY STENT PLACEMENT  05/19/13  . FRACTIONAL FLOW RESERVE WIRE Right 05/22/2013   Procedure: FRACTIONAL FLOW RESERVE WIRE;  Surgeon: Kathleene Hazel, MD;  Location: [facility] CATH LAB;  Service: Cardiovascular;  Laterality: Right;  . LEFT HEART CATHETERIZATION WITH CORONARY ANGIOGRAM N/A 05/19/2013   Procedure: LEFT HEART CATHETERIZATION WITH CORONARY ANGIOGRAM;  Surgeon: Maisie Fus  Alphonsus Sias, MD;  Location: [facility] CATH LAB;  Service: Cardiovascular;  Laterality: N/A;  . LEFT HEART CATHETERIZATION WITH CORONARY ANGIOGRAM N/A 05/20/2014   Procedure: LEFT HEART CATHETERIZATION WITH CORONARY ANGIOGRAM;  Surgeon: Kathleene Hazel, MD;  Location[facility] CATH LAB;  Service: Cardiovascular;  Laterality: N/A;  . PERCUTANEOUS CORONARY STENT INTERVENTION (PCI-S) N/A 05/22/2013   Procedure: PERCUTANEOUS CORONARY STENT INTERVENTION (PCI-S);  Surgeon: Kathleene Hazel,  MD;  Location: [facility] CATH LAB;  Service: Cardiovascular;  Laterality: N/A;    Family History Family History  Problem Relation Age of Onset  . Aneurysm Mother   . Heart disease Sister   . Diabetes Sister   . Diabetes Sister   . Colon polyps Sister   . Colon cancer Neg Hx      Social History  reports that he quit smoking about 15 years ago. His smoking use included cigarettes. He started smoking about 51 years ago. He has a 38.00 pack-year smoking history. He has never used smokeless tobacco. He reports that he drinks alcohol. He reports that he does not use drugs.  Medications  Current Outpatient Medications:  .  aspirin EC 81 MG tablet, Take 81 mg by mouth daily., Disp: , Rfl:  .  atorvastatin (LIPITOR) 40 MG tablet, Take 1 tablet (40 mg total) by mouth every evening., Disp: 30 tablet, Rfl: 6 .  Cholecalciferol (VITAMIN D-3) 1000 UNITS CAPS, Take 1,000 Units by mouth daily. , Disp: , Rfl:  .  lisinopril-hydrochlorothiazide (PRINZIDE,ZESTORETIC) 20-12.5 MG tablet, TAKE 1 TABLET DAILY, Disp: 90 tablet, Rfl: 1 .  metoprolol tartrate (LOPRESSOR) 25 MG tablet, Take 25 mg by mouth 2 (two) times daily., Disp: , Rfl:  .  nitroGLYCERIN (NITROSTAT) 0.4 MG SL tablet, Place 1 tablet (0.4 mg total) under the tongue every 5 (five) minutes as needed for chest pain (up to 3 doses)., Disp: 75 tablet, Rfl: 1 .  omeprazole (PRILOSEC) 40 MG capsule, Take 1 capsule (40 mg total) by mouth at bedtime., Disp: 30 capsule, Rfl: 6 .  vitamin B-12 (CYANOCOBALAMIN) 1000 MCG tablet, Take 1,000 mcg by mouth daily., Disp: , Rfl:  .  BRILINTA 90 MG TABS tablet, TAKE 1 TABLET TWICE A DAY (NEED APPOINTMENT FOR REFILLS, OVERDUE FOR FOLLOW UP, WAS DUE THIS FEBRUARY), Disp: 180 tablet, Rfl: 3  Allergies Patient has no known allergies.  Review of Systems Review of Systems - Oncology ROS as per HPI otherwise 12 point ROS is negative.   Physical Exam  Vitals Wt Readings from Last 3 Encounters:  07/14/17 157 lb (71.2  kg)  05/12/17 163 lb 8 oz (74.2 kg)  04/26/17 164 lb (74.4 kg)   Temp Readings from Last 3 Encounters:  07/14/17 98.2 F (36.8 C) (Oral)  05/12/17 97.9 F (36.6 C) (Oral)  03/16/17 (!) 97.2 F (36.2 C) (Oral)   BP Readings from Last 3 Encounters:  07/15/17 117/80  05/12/17 (!) 153/80  04/26/17 (!) 148/88   Pulse Readings from Last 3 Encounters:  07/15/17 (!) 51  05/12/17 76  04/26/17 64    Constitutional: Well-developed, well-nourished, and in no distress.   HENT: Head: Normocephalic and atraumatic.  Mouth/Throat: No oropharyngeal exudate. Mucosa moist. Eyes: Pupils are equal, round, and reactive to light. Conjunctivae are normal. No scleral icterus.  Neck: Normal range of motion. Neck supple. No JVD present.  Cardiovascular: Normal rate, regular rhythm and normal heart sounds.  Exam reveals no gallop and no friction rub.   No murmur heard. Pulmonary/Chest: Effort normal and breath sounds  normal. No respiratory distress. No wheezes.No rales.  Abdominal: Soft. Bowel sounds are normal. No distension. There is no tenderness. There is no guarding.  Musculoskeletal: No edema or tenderness.  Lymphadenopathy: No cervical, axillary or supraclavicular adenopathy.  Neurological: Alert and oriented to person, place, and time. No cranial nerve deficit.  Skin: Skin is warm and dry. No rash noted. No erythema. No pallor.  Psychiatric: Affect and judgment normal.   Labs No visits with results within 3 Day(s) from this visit.  Latest known visit with results is:  Appointment on 05/05/2017  Component Date Value Ref Range Status  . WBC 05/05/2017 4.9  4.0 - 10.5 K/uL Final  . RBC 05/05/2017 4.79  4.22 - 5.81 MIL/uL Final  . Hemoglobin 05/05/2017 14.0  13.0 - 17.0 g/dL Final  . HCT 78/29/5621 43.4  39.0 - 52.0 % Final  . MCV 05/05/2017 90.6  78.0 - 100.0 fL Final  . MCH 05/05/2017 29.2  26.0 - 34.0 pg Final  . MCHC 05/05/2017 32.3  30.0 - 36.0 g/dL Final  . RDW 30/86/5784 13.3  11.5 -  15.5 % Final  . Platelets 05/05/2017 108* 150 - 400 K/uL Final   Comment: PLATELET COUNT CONFIRMED BY SMEAR SPECIMEN CHECKED FOR CLOTS   . Neutrophils Relative % 05/05/2017 45  % Final  . Neutro Abs 05/05/2017 2.2  1.7 - 7.7 K/uL Final  . Lymphocytes Relative 05/05/2017 42  % Final  . Lymphs Abs 05/05/2017 2.1  0.7 - 4.0 K/uL Final  . Monocytes Relative 05/05/2017 8  % Final  . Monocytes Absolute 05/05/2017 0.4  0.1 - 1.0 K/uL Final  . Eosinophils Relative 05/05/2017 4  % Final  . Eosinophils Absolute 05/05/2017 0.2  0.0 - 0.7 K/uL Final  . Basophils Relative 05/05/2017 1  % Final  . Basophils Absolute 05/05/2017 0.0  0.0 - 0.1 K/uL Final   Performed at [facility], [address], [place], [place] [address]     Pathology Orders Placed This Encounter  Procedures  . CBC with Differential/Platelet    Standing Status:   Future    Standing Expiration Date:   05/13/2018  . Comprehensive metabolic panel    Standing Status:   Future    Standing Expiration Date:   05/13/2018  . Lactate dehydrogenase    Standing Status:   Future    Standing Expiration Date:   05/13/2018       Ahmed Prima MD

## 2017-09-12 DIAGNOSIS — E7800 Pure hypercholesterolemia, unspecified: Secondary | ICD-10-CM | POA: Diagnosis not present

## 2017-09-12 DIAGNOSIS — I2510 Atherosclerotic heart disease of native coronary artery without angina pectoris: Secondary | ICD-10-CM | POA: Diagnosis not present

## 2017-09-12 DIAGNOSIS — I10 Essential (primary) hypertension: Secondary | ICD-10-CM | POA: Diagnosis not present

## 2017-09-15 DIAGNOSIS — I129 Hypertensive chronic kidney disease with stage 1 through stage 4 chronic kidney disease, or unspecified chronic kidney disease: Secondary | ICD-10-CM | POA: Diagnosis present

## 2017-09-15 DIAGNOSIS — Z7982 Long term (current) use of aspirin: Secondary | ICD-10-CM | POA: Diagnosis not present

## 2017-09-15 DIAGNOSIS — N3001 Acute cystitis with hematuria: Secondary | ICD-10-CM | POA: Diagnosis not present

## 2017-09-15 DIAGNOSIS — N183 Chronic kidney disease, stage 3 (moderate): Secondary | ICD-10-CM | POA: Diagnosis present

## 2017-09-15 DIAGNOSIS — Z299 Encounter for prophylactic measures, unspecified: Secondary | ICD-10-CM | POA: Diagnosis not present

## 2017-09-15 DIAGNOSIS — R110 Nausea: Secondary | ICD-10-CM | POA: Diagnosis not present

## 2017-09-15 DIAGNOSIS — R112 Nausea with vomiting, unspecified: Secondary | ICD-10-CM | POA: Diagnosis present

## 2017-09-15 DIAGNOSIS — Z79899 Other long term (current) drug therapy: Secondary | ICD-10-CM | POA: Diagnosis not present

## 2017-09-15 DIAGNOSIS — I10 Essential (primary) hypertension: Secondary | ICD-10-CM | POA: Diagnosis not present

## 2017-09-15 DIAGNOSIS — N419 Inflammatory disease of prostate, unspecified: Secondary | ICD-10-CM | POA: Diagnosis present

## 2017-09-15 DIAGNOSIS — Z7902 Long term (current) use of antithrombotics/antiplatelets: Secondary | ICD-10-CM | POA: Diagnosis not present

## 2017-09-15 DIAGNOSIS — K219 Gastro-esophageal reflux disease without esophagitis: Secondary | ICD-10-CM | POA: Diagnosis present

## 2017-09-15 DIAGNOSIS — Z6826 Body mass index (BMI) 26.0-26.9, adult: Secondary | ICD-10-CM | POA: Diagnosis not present

## 2017-09-15 DIAGNOSIS — Z87891 Personal history of nicotine dependence: Secondary | ICD-10-CM | POA: Diagnosis not present

## 2017-09-15 DIAGNOSIS — R197 Diarrhea, unspecified: Secondary | ICD-10-CM | POA: Diagnosis present

## 2017-09-15 DIAGNOSIS — E860 Dehydration: Secondary | ICD-10-CM | POA: Diagnosis present

## 2017-09-15 DIAGNOSIS — N390 Urinary tract infection, site not specified: Secondary | ICD-10-CM | POA: Diagnosis present

## 2017-09-15 DIAGNOSIS — I2510 Atherosclerotic heart disease of native coronary artery without angina pectoris: Secondary | ICD-10-CM | POA: Diagnosis not present

## 2017-09-15 DIAGNOSIS — R5081 Fever presenting with conditions classified elsewhere: Secondary | ICD-10-CM | POA: Diagnosis not present

## 2017-09-15 DIAGNOSIS — R509 Fever, unspecified: Secondary | ICD-10-CM | POA: Diagnosis not present

## 2017-09-18 DIAGNOSIS — I10 Essential (primary) hypertension: Secondary | ICD-10-CM | POA: Diagnosis not present

## 2017-09-18 DIAGNOSIS — Z299 Encounter for prophylactic measures, unspecified: Secondary | ICD-10-CM | POA: Diagnosis not present

## 2017-09-18 DIAGNOSIS — Z6827 Body mass index (BMI) 27.0-27.9, adult: Secondary | ICD-10-CM | POA: Diagnosis not present

## 2017-09-18 DIAGNOSIS — N390 Urinary tract infection, site not specified: Secondary | ICD-10-CM | POA: Diagnosis not present

## 2017-09-18 DIAGNOSIS — N419 Inflammatory disease of prostate, unspecified: Secondary | ICD-10-CM | POA: Diagnosis not present

## 2017-09-18 DIAGNOSIS — K219 Gastro-esophageal reflux disease without esophagitis: Secondary | ICD-10-CM | POA: Diagnosis not present

## 2017-09-27 ENCOUNTER — Other Ambulatory Visit: Payer: Self-pay | Admitting: *Deleted

## 2017-09-27 MED ORDER — NITROGLYCERIN 0.4 MG SL SUBL: 0.4000 mg | SUBLINGUAL_TABLET | SUBLINGUAL | 1 refills | Status: DC | PRN

## 2017-10-10 DIAGNOSIS — I2510 Atherosclerotic heart disease of native coronary artery without angina pectoris: Secondary | ICD-10-CM | POA: Diagnosis not present

## 2017-10-10 DIAGNOSIS — E7800 Pure hypercholesterolemia, unspecified: Secondary | ICD-10-CM | POA: Diagnosis not present

## 2017-10-10 DIAGNOSIS — I10 Essential (primary) hypertension: Secondary | ICD-10-CM | POA: Diagnosis not present

## 2017-10-19 DIAGNOSIS — Z1331 Encounter for screening for depression: Secondary | ICD-10-CM | POA: Diagnosis not present

## 2017-10-19 DIAGNOSIS — Z6827 Body mass index (BMI) 27.0-27.9, adult: Secondary | ICD-10-CM | POA: Diagnosis not present

## 2017-10-19 DIAGNOSIS — Z299 Encounter for prophylactic measures, unspecified: Secondary | ICD-10-CM | POA: Diagnosis not present

## 2017-10-19 DIAGNOSIS — I10 Essential (primary) hypertension: Secondary | ICD-10-CM | POA: Diagnosis not present

## 2017-10-19 DIAGNOSIS — Z1339 Encounter for screening examination for other mental health and behavioral disorders: Secondary | ICD-10-CM | POA: Diagnosis not present

## 2017-10-19 DIAGNOSIS — E7800 Pure hypercholesterolemia, unspecified: Secondary | ICD-10-CM | POA: Diagnosis not present

## 2017-10-19 DIAGNOSIS — Z7189 Other specified counseling: Secondary | ICD-10-CM | POA: Diagnosis not present

## 2017-10-19 DIAGNOSIS — N183 Chronic kidney disease, stage 3 (moderate): Secondary | ICD-10-CM | POA: Diagnosis not present

## 2017-10-19 DIAGNOSIS — Z0000 Encounter for general adult medical examination without abnormal findings: Secondary | ICD-10-CM | POA: Diagnosis not present

## 2017-10-19 DIAGNOSIS — Z1211 Encounter for screening for malignant neoplasm of colon: Secondary | ICD-10-CM | POA: Diagnosis not present

## 2017-10-19 DIAGNOSIS — R5383 Other fatigue: Secondary | ICD-10-CM | POA: Diagnosis not present

## 2017-11-09 ENCOUNTER — Inpatient Hospital Stay (HOSPITAL_COMMUNITY): Payer: Medicare Other | Attending: Internal Medicine

## 2017-11-09 DIAGNOSIS — D696 Thrombocytopenia, unspecified: Secondary | ICD-10-CM | POA: Diagnosis not present

## 2017-11-09 LAB — COMPREHENSIVE METABOLIC PANEL
ALBUMIN: 4.1 g/dL (ref 3.5–5.0)
ALK PHOS: 58 U/L (ref 38–126)
ALT: 21 U/L (ref 0–44)
AST: 27 U/L (ref 15–41)
Anion gap: 9 (ref 5–15)
BUN: 11 mg/dL (ref 8–23)
CALCIUM: 9.7 mg/dL (ref 8.9–10.3)
CO2: 30 mmol/L (ref 22–32)
CREATININE: 1.56 mg/dL — AB (ref 0.61–1.24)
Chloride: 100 mmol/L (ref 98–111)
GFR calc Af Amer: 50 mL/min — ABNORMAL LOW (ref >60)
GFR calc non Af Amer: 43 mL/min — ABNORMAL LOW (ref >60)
GLUCOSE: 105 mg/dL — AB (ref 70–99)
Potassium: 4.1 mmol/L (ref 3.5–5.1)
SODIUM: 139 mmol/L (ref 135–145)
Total Bilirubin: 1.1 mg/dL (ref 0.3–1.2)
Total Protein: 7.2 g/dL (ref 6.5–8.1)

## 2017-11-09 LAB — CBC WITH DIFFERENTIAL/PLATELET
BASOS ABS: 0 10*3/uL (ref 0.0–0.1)
BASOS PCT: 1 %
EOS ABS: 0.2 10*3/uL (ref 0.0–0.7)
Eosinophils Relative: 4 %
HCT: 44.2 % (ref 39.0–52.0)
HEMOGLOBIN: 15 g/dL (ref 13.0–17.0)
LYMPHS ABS: 2.4 10*3/uL (ref 0.7–4.0)
Lymphocytes Relative: 39 %
MCH: 30.4 pg (ref 26.0–34.0)
MCHC: 33.9 g/dL (ref 30.0–36.0)
MCV: 89.7 fL (ref 78.0–100.0)
Monocytes Absolute: 0.5 10*3/uL (ref 0.1–1.0)
Monocytes Relative: 8 %
NEUTROS PCT: 48 %
Neutro Abs: 3 10*3/uL (ref 1.7–7.7)
Platelets: 110 10*3/uL — ABNORMAL LOW (ref 150–400)
RBC: 4.93 MIL/uL (ref 4.22–5.81)
RDW: 13.5 % (ref 11.5–15.5)
WBC: 6.1 10*3/uL (ref 4.0–10.5)

## 2017-11-09 LAB — LACTATE DEHYDROGENASE: LDH: 151 U/L (ref 98–192)

## 2017-11-16 ENCOUNTER — Ambulatory Visit (HOSPITAL_COMMUNITY): Payer: Medicare Other | Admitting: Internal Medicine

## 2017-11-29 ENCOUNTER — Other Ambulatory Visit: Payer: Self-pay

## 2017-11-29 ENCOUNTER — Other Ambulatory Visit (HOSPITAL_COMMUNITY): Payer: Self-pay | Admitting: Internal Medicine

## 2017-11-29 ENCOUNTER — Inpatient Hospital Stay (HOSPITAL_COMMUNITY): Payer: Medicare Other | Attending: Internal Medicine | Admitting: Internal Medicine

## 2017-11-29 ENCOUNTER — Encounter (HOSPITAL_COMMUNITY): Payer: Self-pay | Admitting: Internal Medicine

## 2017-11-29 VITALS — BP 143/81 | HR 61 | Temp 97.9°F | Resp 18 | Wt 160.0 lb

## 2017-11-29 DIAGNOSIS — K760 Fatty (change of) liver, not elsewhere classified: Secondary | ICD-10-CM | POA: Diagnosis not present

## 2017-11-29 DIAGNOSIS — I2510 Atherosclerotic heart disease of native coronary artery without angina pectoris: Secondary | ICD-10-CM | POA: Insufficient documentation

## 2017-11-29 DIAGNOSIS — Z79899 Other long term (current) drug therapy: Secondary | ICD-10-CM | POA: Diagnosis not present

## 2017-11-29 DIAGNOSIS — Z87891 Personal history of nicotine dependence: Secondary | ICD-10-CM

## 2017-11-29 DIAGNOSIS — D696 Thrombocytopenia, unspecified: Secondary | ICD-10-CM

## 2017-11-29 DIAGNOSIS — K219 Gastro-esophageal reflux disease without esophagitis: Secondary | ICD-10-CM | POA: Insufficient documentation

## 2017-11-29 DIAGNOSIS — I129 Hypertensive chronic kidney disease with stage 1 through stage 4 chronic kidney disease, or unspecified chronic kidney disease: Secondary | ICD-10-CM

## 2017-11-29 DIAGNOSIS — M1990 Unspecified osteoarthritis, unspecified site: Secondary | ICD-10-CM | POA: Diagnosis not present

## 2017-11-29 DIAGNOSIS — N182 Chronic kidney disease, stage 2 (mild): Secondary | ICD-10-CM

## 2017-11-29 DIAGNOSIS — Z7982 Long term (current) use of aspirin: Secondary | ICD-10-CM | POA: Diagnosis not present

## 2017-11-29 NOTE — Progress Notes (Signed)
Diagnosis No diagnosis found.  Staging Cancer Staging No matching staging information was found for the patient.  Assessment and Plan:  1.  Thrombocytopenia ([place]).  Pt was previously followed by Dr. Talbert Cage.  Pt had low platelets dating back to at least 2015 with preservation of WBC and hemoglobin/RBC/HCT.  Peripheral work-up has ruled out B12 and folate deficiency, copper deficiency, and hypersplenism (on ultrasound).  Hepatitis C and HIV testing was negative.  He does have fatty infiltration of liver which could be the culprit.  Pt has remained on observation.    Labs done 11/09/2017 reviewed and showed WBC 6.1 HB 15 plts 110,000.  Chemistries WNL with K+ 4.1 Cr 1.56 and normal LFTs.  Plts remain greater than 100,000.  He will RTC for follow-up and labs in 05/2018.     2. CKD (chronic kidney disease), stage II.  Cr 1.56.  SPEP on RTC with labs in 05/2018.   3. HTN.  BP 143/81. Follow-up with PCP.    4. Fatty Liver.  This was noted on USN done 02/02/2017.  May also play a role in thrombocytopenia.  LFTs WNL on labs done 11/09/2017.  Will repeat chemistries on RTC in 05/2018.      Interval History:  Historical data obtained from note dated 05/12/2017.  72 y.o. male previously followed by Dr. Talbert Cage due to chronic thrombocytopenia. CBC from 12/01/2016 demonstrated WBC 5.8K, hemoglobin 14.7 g/dL, hematocrit 43.9%, platelet count 97K.  Review of his CBCs for the past 2 years have demonstrated that his platelet count has ranged between 80 4K to 113 K.  His WBC and hemoglobin of always been normal.  Patient is completely asymptomatic and has not had any problems with bleeding or bruising.  Pt was suspected of having benign  thrombocytopenia given its chronicity and stability over 3+ years.   Current Status:  Pt is seen today for follow-up.  He is here to go over labs.    Problem List Patient Active Problem List   Diagnosis Date Noted  . Fatty infiltration of liver [K76.0] 02/10/2017  . Pancreatitis, acute  [K85.90] 12/29/2016  . Atypical chest pain [R07.89] 11/07/2014  . Chest pain [R07.9] 11/07/2014  . GERD (gastroesophageal reflux disease) [K21.9]   . CAD (coronary artery disease) [I25.10]   . Unstable angina ([place]) [I20.0] 05/20/2014  . CKD (chronic kidney disease), stage II [N18.2] 05/23/2013  . Thrombocytopenia ([place]) [D69.6]   . Essential hypertension, benign [I10] 05/19/2013    Past Medical History Past Medical History:  Diagnosis Date  . Arthritis   . CAD (coronary artery disease)    a. 05/19/13: DES x 2 to RCA, staged DES to OM 05/22/13, residual LAD/diag disease for Rx mgmt  b. 05/20/14 [place] s/p DES to LAD  . CKD (chronic kidney disease), stage II   . Essential hypertension, benign   . GERD (gastroesophageal reflux disease)   . Thrombocytopenia [facility])     Past Surgical History Past Surgical History:  Procedure Laterality Date  . Bilateral hip replacements    . CARDIAC CATHETERIZATION    . COLONOSCOPY W/ POLYPECTOMY  05/2016   COLON POLYPS REMOVED(>3) [facility]  . CORONARY ANGIOPLASTY WITH STENT PLACEMENT  05/20/2014   mid lad   . CORONARY STENT PLACEMENT  05/19/13  . FRACTIONAL FLOW RESERVE WIRE Right 05/22/2013   Procedure: FRACTIONAL FLOW RESERVE WIRE;  Surgeon: Burnell Blanks, MD;  Location[facility] CATH LAB;  Service: Cardiovascular;  Laterality: Right;  . LEFT HEART CATHETERIZATION WITH CORONARY ANGIOGRAM N/A 05/19/2013  Procedure: LEFT HEART CATHETERIZATION WITH CORONARY ANGIOGRAM;  Surgeon: Troy Sine, MD;  Location: [facility] CATH LAB;  Service: Cardiovascular;  Laterality: N/A;  . LEFT HEART CATHETERIZATION WITH CORONARY ANGIOGRAM N/A 05/20/2014   Procedure: LEFT HEART CATHETERIZATION WITH CORONARY ANGIOGRAM;  Surgeon: Burnell Blanks, MD;  Location: [facility] CATH LAB;  Service: Cardiovascular;  Laterality: N/A;  . PERCUTANEOUS CORONARY STENT INTERVENTION (PCI-S) N/A 05/22/2013   Procedure: PERCUTANEOUS CORONARY STENT INTERVENTION (PCI-S);  Surgeon: Burnell Blanks,  MD;  Location: [facility] CATH LAB;  Service: Cardiovascular;  Laterality: N/A;    Family History Family History  Problem Relation Age of Onset  . Aneurysm Mother   . Heart disease Sister   . Diabetes Sister   . Diabetes Sister   . Colon polyps Sister   . Colon cancer Neg Hx      Social History  reports that he quit smoking about 15 years ago. His smoking use included cigarettes. He started smoking about 52 years ago. He has a 38.00 pack-year smoking history. He has never used smokeless tobacco. He reports that he drinks alcohol. He reports that he does not use drugs.  Medications  Current Outpatient Medications:  .  aspirin EC 81 MG tablet, Take 81 mg by mouth daily., Disp: , Rfl:  .  atorvastatin (LIPITOR) 40 MG tablet, Take 1 tablet (40 mg total) by mouth every evening., Disp: 30 tablet, Rfl: 6 .  BRILINTA 90 MG TABS tablet, TAKE 1 TABLET TWICE A DAY (NEED APPOINTMENT FOR REFILLS, OVERDUE FOR FOLLOW UP, WAS DUE THIS FEBRUARY), Disp: 180 tablet, Rfl: 3 .  Cholecalciferol (VITAMIN D-3) 1000 UNITS CAPS, Take 1,000 Units by mouth daily. , Disp: , Rfl:  .  lisinopril-hydrochlorothiazide (PRINZIDE,ZESTORETIC) 20-12.5 MG tablet, TAKE 1 TABLET DAILY, Disp: 90 tablet, Rfl: 1 .  metoprolol tartrate (LOPRESSOR) 25 MG tablet, Take 25 mg by mouth 2 (two) times daily., Disp: , Rfl:  .  nitroGLYCERIN (NITROSTAT) 0.4 MG SL tablet, Place 1 tablet (0.4 mg total) under the tongue every 5 (five) minutes as needed for chest pain (up to 3 doses)., Disp: 75 tablet, Rfl: 1 .  omeprazole (PRILOSEC) 40 MG capsule, Take 1 capsule (40 mg total) by mouth at bedtime., Disp: 30 capsule, Rfl: 6 .  vitamin B-12 (CYANOCOBALAMIN) 1000 MCG tablet, Take 1,000 mcg by mouth daily., Disp: , Rfl:   Allergies Patient has no known allergies.  Review of Systems Review of Systems - Oncology ROS negative   Physical Exam  Vitals Wt Readings from Last 3 Encounters:  11/29/17 160 lb (72.6 kg)  07/14/17 157 lb (71.2 kg)   05/12/17 163 lb 8 oz (74.2 kg)   Temp Readings from Last 3 Encounters:  11/29/17 97.9 F (36.6 C) (Oral)  07/14/17 98.2 F (36.8 C) (Oral)  05/12/17 97.9 F (36.6 C) (Oral)   BP Readings from Last 3 Encounters:  11/29/17 (!) 143/81  07/15/17 117/80  05/12/17 (!) 153/80   Pulse Readings from Last 3 Encounters:  11/29/17 61  07/15/17 (!) 51  05/12/17 76   Constitutional: Well-developed, well-nourished, and in no distress.   HENT: Head: Normocephalic and atraumatic.  Mouth/Throat: No oropharyngeal exudate. Mucosa moist. Eyes: Pupils are equal, round, and reactive to light. Conjunctivae are normal. No scleral icterus.  Neck: Normal range of motion. Neck supple. No JVD present.  Cardiovascular: Normal rate, regular rhythm and normal heart sounds.  Exam reveals no gallop and no friction rub.   No murmur heard. Pulmonary/Chest: Effort normal and breath sounds  normal. No respiratory distress. No wheezes.No rales.  Abdominal: Soft. Bowel sounds are normal. No distension. There is no tenderness. There is no guarding.  Musculoskeletal: No edema or tenderness.  Lymphadenopathy:No cervical, axillary or supraclavicular adenopathy.  Neurological: Alert and oriented to person, place, and time. No cranial nerve deficit.  Skin: Skin is warm and dry. No rash noted. No erythema. No pallor.  Psychiatric: Affect and judgment normal.   Labs No visits with results within 3 Day(s) from this visit.  Latest known visit with results is:  Appointment on 11/09/2017  Component Date Value Ref Range Status  . WBC 11/09/2017 6.1  4.0 - 10.5 K/uL Final  . RBC 11/09/2017 4.93  4.22 - 5.81 MIL/uL Final  . Hemoglobin 11/09/2017 15.0  13.0 - 17.0 g/dL Final  . HCT 11/09/2017 44.2  39.0 - 52.0 % Final  . MCV 11/09/2017 89.7  78.0 - 100.0 fL Final  . MCH 11/09/2017 30.4  26.0 - 34.0 pg Final  . MCHC 11/09/2017 33.9  30.0 - 36.0 g/dL Final  . RDW 11/09/2017 13.5  11.5 - 15.5 % Final  . Platelets 11/09/2017  110* 150 - 400 K/uL Final   Comment: SPECIMEN CHECKED FOR CLOTS PLATELET COUNT CONFIRMED BY SMEAR   . Neutrophils Relative % 11/09/2017 48  % Final  . Neutro Abs 11/09/2017 3.0  1.7 - 7.7 K/uL Final  . Lymphocytes Relative 11/09/2017 39  % Final  . Lymphs Abs 11/09/2017 2.4  0.7 - 4.0 K/uL Final  . Monocytes Relative 11/09/2017 8  % Final  . Monocytes Absolute 11/09/2017 0.5  0.1 - 1.0 K/uL Final  . Eosinophils Relative 11/09/2017 4  % Final  . Eosinophils Absolute 11/09/2017 0.2  0.0 - 0.7 K/uL Final  . Basophils Relative 11/09/2017 1  % Final  . Basophils Absolute 11/09/2017 0.0  0.0 - 0.1 K/uL Final   Performed at [facility], [address], [place], [place] [address]  . Sodium 11/09/2017 139  135 - 145 mmol/L Final  . Potassium 11/09/2017 4.1  3.5 - 5.1 mmol/L Final  . Chloride 11/09/2017 100  98 - 111 mmol/L Final  . CO2 11/09/2017 30  22 - 32 mmol/L Final  . Glucose, Bld 11/09/2017 105* 70 - 99 mg/dL Final  . BUN 11/09/2017 11  8 - 23 mg/dL Final  . Creatinine, Ser 11/09/2017 1.56* 0.61 - 1.24 mg/dL Final  . Calcium 11/09/2017 9.7  8.9 - 10.3 mg/dL Final  . Total Protein 11/09/2017 7.2  6.5 - 8.1 g/dL Final  . Albumin 11/09/2017 4.1  3.5 - 5.0 g/dL Final  . AST 11/09/2017 27  15 - 41 U/L Final  . ALT 11/09/2017 21  0 - 44 U/L Final  . Alkaline Phosphatase 11/09/2017 58  38 - 126 U/L Final  . Total Bilirubin 11/09/2017 1.1  0.3 - 1.2 mg/dL Final  . GFR calc non Af Amer 11/09/2017 43* >60 mL/min Final  . GFR calc Af Amer 11/09/2017 50* >60 mL/min Final   Comment: (NOTE) The eGFR has been calculated using the CKD EPI equation. This calculation has not been validated in all clinical situations. eGFR's persistently <60 mL/min signify possible Chronic Kidney Disease.   Georgiann Hahn gap 11/09/2017 9  5 - 15 Final   Performed at [facility], [address], [place], [place] [address]  . LDH 11/09/2017 151  98 - 192 U/L Final   Performed at [facility], [address],  [place], [place] [address]  Pathology No orders of the defined types were placed in this encounter.      Zoila Shutter MD

## 2017-12-15 DIAGNOSIS — I2510 Atherosclerotic heart disease of native coronary artery without angina pectoris: Secondary | ICD-10-CM | POA: Diagnosis not present

## 2017-12-15 DIAGNOSIS — E7800 Pure hypercholesterolemia, unspecified: Secondary | ICD-10-CM | POA: Diagnosis not present

## 2017-12-15 DIAGNOSIS — I10 Essential (primary) hypertension: Secondary | ICD-10-CM | POA: Diagnosis not present

## 2018-01-04 NOTE — Progress Notes (Signed)
Cardiology Office Note  Date: 01/05/2018   ID: [name]., DOB Jan 23, 1946, MRN [id number]  PCP: Kirstie Peri, MD  Primary Cardiologist: Nona Dell, MD   Chief Complaint  Patient presents with  . Coronary Artery Disease    History of Present Illness: Keith Keith. is a 72 y.o. male last seen in March.  He is here today with his wife for a follow-up visit.  He tells me that he has not had any definitive angina symptoms recently, has not used any nitroglycerin.  I reviewed interval records, he was seen in the ER in May with chest pain, described as being fairly atypical based on notes.  ECG showed no acute changes in his troponin I levels were negative.  He declined further inpatient work-up.  We discussed follow-up ischemic testing, however he has not presented for this when arranged.  I went over his medications today which are outlined below.  No change in cardiac regimen.  He prefers to stay on dual antiplatelet therapy, I have given him the option of stopping Brilinta.  He does not report any bleeding problems.  We talked about follow-up ischemic testing again today for surveillance, last DES intervention was in 2016.  He states that he will consider this in December.  Past Medical History:  Diagnosis Date  . Arthritis   . CAD (coronary artery disease)    a. 05/19/13: DES x 2 to RCA, staged DES to OM 05/22/13, residual LAD/diag disease for Rx mgmt  b. 05/20/14 [place] s/p DES to LAD  . CKD (chronic kidney disease), stage II   . Essential hypertension, benign   . GERD (gastroesophageal reflux disease)   . Thrombocytopenia ([facility])     Past Surgical History:  Procedure Laterality Date  . Bilateral hip replacements    . CARDIAC CATHETERIZATION    . COLONOSCOPY W/ POLYPECTOMY  05/2016   [facility]  . CORONARY ANGIOPLASTY WITH STENT PLACEMENT  05/20/2014   mid lad   . CORONARY STENT PLACEMENT  05/19/13  . FRACTIONAL FLOW RESERVE WIRE Right  05/22/2013   Procedure: FRACTIONAL FLOW RESERVE WIRE;  Surgeon: Kathleene Hazel, MD;  Location: [facility] CATH LAB;  Service: Cardiovascular;  Laterality: Right;  . LEFT HEART CATHETERIZATION WITH CORONARY ANGIOGRAM N/A 05/19/2013   Procedure: LEFT HEART CATHETERIZATION WITH CORONARY ANGIOGRAM;  Surgeon: Lennette Bihari, MD;  Location: [facility] CATH LAB;  Service: Cardiovascular;  Laterality: N/A;  . LEFT HEART CATHETERIZATION WITH CORONARY ANGIOGRAM N/A 05/20/2014   Procedure: LEFT HEART CATHETERIZATION WITH CORONARY ANGIOGRAM;  Surgeon: Kathleene Hazel, MD;  Location: [facility] CATH LAB;  Service: Cardiovascular;  Laterality: N/A;  . PERCUTANEOUS CORONARY STENT INTERVENTION (PCI-S) N/A 05/22/2013   Procedure: PERCUTANEOUS CORONARY STENT INTERVENTION (PCI-S);  Surgeon: Kathleene Hazel, MD;  Location: [facility] CATH LAB;  Service: Cardiovascular;  Laterality: N/A;    Current Outpatient Medications  Medication Sig Dispense Refill  . aspirin EC 81 MG tablet Take 81 mg by mouth daily.    Marland Kitchen atorvastatin (LIPITOR) 40 MG tablet Take 1 tablet (40 mg total) by mouth every evening. 30 tablet 6  . BRILINTA 90 MG TABS tablet TAKE 1 TABLET TWICE A DAY (NEED APPOINTMENT FOR REFILLS, OVERDUE FOR FOLLOW UP, WAS DUE THIS FEBRUARY) 180 tablet 3  . Cholecalciferol (VITAMIN D-3) 1000 UNITS CAPS Take 1,000 Units by mouth daily.     Marland Kitchen lisinopril-hydrochlorothiazide (PRINZIDE,ZESTORETIC) 20-12.5 MG tablet TAKE 1 TABLET DAILY 90 tablet 1  . metoprolol  tartrate (LOPRESSOR) 25 MG tablet Take 25 mg by mouth 2 (two) times daily.    . nitroGLYCERIN (NITROSTAT) 0.4 MG SL tablet Place 1 tablet (0.4 mg total) under the tongue every 5 (five) minutes as needed for chest pain (up to 3 doses). 75 tablet 1  . omeprazole (PRILOSEC) 40 MG capsule Take 1 capsule (40 mg total) by mouth at bedtime. 30 capsule 6  . vitamin B-12 (CYANOCOBALAMIN) 1000 MCG tablet Take 1,000 mcg by mouth daily.     No current facility-administered medications for this  visit.    Allergies:  Patient has no known allergies.   Social History: The patient  reports that he quit smoking about 15 years ago. His smoking use included cigarettes. He started smoking about 52 years ago. He has a 38.00 pack-year smoking history. He has never used smokeless tobacco. He reports that he drinks alcohol. He reports that he does not use drugs.   ROS:  Please see the history of present illness. Otherwise, complete review of systems is positive for none.  All other systems are reviewed and negative.   Physical Exam: VS:  BP 138/80   Pulse 85   Ht 5\' 5"  (1.651 m)   Wt 167 lb (75.8 kg)   SpO2 98%   BMI 27.79 kg/m , BMI Body mass index is 27.79 kg/m.  Wt Readings from Last 3 Encounters:  01/05/18 167 lb (75.8 kg)  11/29/17 160 lb (72.6 kg)  07/14/17 157 lb (71.2 kg)    General: Patient appears comfortable at rest. HEENT: Conjunctiva and lids normal, oropharynx clear. Neck: Supple, no elevated JVP or carotid bruits, no thyromegaly. Lungs: Clear to auscultation, nonlabored breathing at rest. Cardiac: Regular rate and rhythm, no S3 or significant systolic murmur. Abdomen: Soft, nontender, bowel sounds present. Extremities: No pitting edema, distal pulses 2+. Skin: Warm and dry. Musculoskeletal: No kyphosis. Neuropsychiatric: Alert and oriented x3, affect grossly appropriate.  ECG: I personally reviewed the tracing from 07/14/2017 which showed sinus rhythm with borderline low voltage.  Recent Labwork: 11/09/2017: ALT 21; AST 27; BUN 11; Creatinine, Ser 1.56; Hemoglobin 15.0; Platelets 110; Potassium 4.1; Sodium 139     Component Value Date/Time   CHOL 128 05/20/2013 0047   TRIG 92 05/20/2013 0047   HDL 42 05/20/2013 0047   CHOLHDL 3.0 05/20/2013 0047   VLDL 18 05/20/2013 0047   LDLCALC 68 05/20/2013 0047    Other Studies Reviewed Today:  Echocardiogram 05/20/2014: Study Conclusions  - Left ventricle: The cavity size was normal. Wall thickness was increased  in a pattern of moderate LVH. Systolic function was normal. The estimated ejection fraction was in the range of 60% to 65%. Wall motion was normal; there were no regional wall motion abnormalities. Doppler parameters are consistent with abnormal left ventricular relaxation (grade 1 diastolic dysfunction). The E/e&' ratio is between 8-15, suggesting indeterminate LV filling pressure. - Aortic valve: Sclerosis without stenosis. There was no regurgitation. - Mitral valve: There was no significant regurgitation. - Left atrium: The atrium was normal in size. - Right ventricle: The cavity size was normal. Wall thickness was normal. Systolic function is reduced. - Right atrium: The atrium was normal in size.  Impressions:  - LVEF 60-65%, moderate LVH, diastolic dysfunction, indeterminate (but likely elevated LV Filling pressure), normal LA size, mildly reduced RV function by TAPSE, no significant TR  Assessment and Plan:  1.  CAD status post DES x2 to the RCA with staged DES to the OM in 2015 followed by DES  to the LAD in 2016.  He prefers to continue dual antiplatelet therapy, does not report any bleeding problems.  We will obtain a follow-up ischemic evaluation, Lexiscan Myoview in December.  He has intermittent chest pain that is largely atypical.  2.  Mixed hyperlipidemia, continues on Lipitor.  He reports interval lab work through the [facility] system and plans to bring this by for review.  3.  CKD stage 3, last creatinine 1.56 in September.   Current medicines were reviewed with the patient today.   Orders Placed This Encounter  Procedures  . NM Myocar Multi W/Spect W/Wall Motion / EF    Disposition: Anticipate follow-up in 6 months if stress testing reassuring.  Signed, Jonelle Sidle, MD, [facility] 01/05/2018 2:23 PM    [facility] at [facility] [address] [place], [place], [place] [address] Phone: [phone]; Fax: [phone]

## 2018-01-05 ENCOUNTER — Ambulatory Visit (INDEPENDENT_AMBULATORY_CARE_PROVIDER_SITE_OTHER): Payer: Medicare Other | Admitting: Cardiology

## 2018-01-05 ENCOUNTER — Encounter: Payer: Self-pay | Admitting: Cardiology

## 2018-01-05 ENCOUNTER — Encounter: Payer: Self-pay | Admitting: *Deleted

## 2018-01-05 VITALS — BP 138/80 | HR 85 | Ht 65.0 in | Wt 167.0 lb

## 2018-01-05 DIAGNOSIS — I25119 Atherosclerotic heart disease of native coronary artery with unspecified angina pectoris: Secondary | ICD-10-CM

## 2018-01-05 DIAGNOSIS — N183 Chronic kidney disease, stage 3 unspecified: Secondary | ICD-10-CM

## 2018-01-05 DIAGNOSIS — E782 Mixed hyperlipidemia: Secondary | ICD-10-CM | POA: Diagnosis not present

## 2018-01-05 NOTE — Patient Instructions (Addendum)
Medication Instructions:   Your physician recommends that you continue on your current medications as directed. Please refer to the Current Medication list given to you today.  Labwork:  NONE  Testing/Procedures: Your physician has requested that you have a lexiscan myoview. For further information please visit [email or web address]. Please follow instruction sheet, as given.  Follow-Up:  Your physician recommends that you schedule a follow-up appointment in: 6 months. You will receive a reminder letter in the mail in about 4 months reminding you to call and schedule your appointment. If you don't receive this letter, please contact our office.  Any Other Special Instructions Will Be Listed Below (If Applicable).  If you need a refill on your cardiac medications before your next appointment, please call your pharmacy. 

## 2018-01-22 DIAGNOSIS — I10 Essential (primary) hypertension: Secondary | ICD-10-CM | POA: Diagnosis not present

## 2018-01-22 DIAGNOSIS — I2510 Atherosclerotic heart disease of native coronary artery without angina pectoris: Secondary | ICD-10-CM | POA: Diagnosis not present

## 2018-01-22 DIAGNOSIS — E7800 Pure hypercholesterolemia, unspecified: Secondary | ICD-10-CM | POA: Diagnosis not present

## 2018-01-26 ENCOUNTER — Encounter (HOSPITAL_COMMUNITY): Payer: Medicare Other

## 2018-02-02 ENCOUNTER — Encounter: Payer: Self-pay | Admitting: Gastroenterology

## 2018-02-26 ENCOUNTER — Encounter (HOSPITAL_BASED_OUTPATIENT_CLINIC_OR_DEPARTMENT_OTHER)
Admission: RE | Admit: 2018-02-26 | Discharge: 2018-02-26 | Disposition: A | Discharge status: Home / Self Care | Payer: Medicare Other | Source: Ambulatory Visit | Attending: Cardiology | Admitting: Cardiology

## 2018-02-26 ENCOUNTER — Encounter (HOSPITAL_COMMUNITY)
Admission: RE | Admit: 2018-02-26 | Discharge: 2018-02-26 | Disposition: A | Discharge status: Home / Self Care | Payer: Medicare Other | Source: Ambulatory Visit | Attending: Cardiology | Admitting: Cardiology

## 2018-02-26 DIAGNOSIS — I25119 Atherosclerotic heart disease of native coronary artery with unspecified angina pectoris: Secondary | ICD-10-CM | POA: Insufficient documentation

## 2018-02-26 LAB — NM MYOCAR MULTI W/SPECT W/WALL MOTION / EF
CHL CUP NUCLEAR SRS: 2
CSEPPHR: 95 {beats}/min
LHR: 0.56
LV dias vol: 52 mL (ref 62–150)
LVSYSVOL: 18 mL
NUC STRESS TID: 0.83
Rest HR: 52 {beats}/min
SDS: 3
SSS: 5

## 2018-02-26 MED ORDER — SODIUM CHLORIDE 0.9% FLUSH
INTRAVENOUS | Status: AC
  Administered 2018-02-26: 10 mL via INTRAVENOUS
  Filled 2018-02-26: qty 10

## 2018-02-26 MED ORDER — TECHNETIUM TC 99M TETROFOSMIN IV KIT
10.0000 | PACK | Freq: Once | INTRAVENOUS | Status: AC | PRN
  Administered 2018-02-26: 11 via INTRAVENOUS

## 2018-02-26 MED ORDER — REGADENOSON 0.4 MG/5ML IV SOLN
INTRAVENOUS | Status: AC
  Administered 2018-02-26: 0.4 mg via INTRAVENOUS
  Filled 2018-02-26: qty 5

## 2018-02-26 MED ORDER — TECHNETIUM TC 99M TETROFOSMIN IV KIT
30.0000 | PACK | Freq: Once | INTRAVENOUS | Status: AC | PRN
  Administered 2018-02-26: 31 via INTRAVENOUS

## 2018-02-27 ENCOUNTER — Telehealth: Payer: Self-pay | Admitting: *Deleted

## 2018-02-27 NOTE — Telephone Encounter (Signed)
-----   Message from Jonelle Sidle, MD sent at 02/26/2018  4:56 PM EST ----- Results reviewed.  Please let him know that the stress test was low risk, no significant ischemic territories to suggest change from medical therapy and observation.  Continue with current follow-up plan. A copy of this test should be forwarded to Kirstie Peri, MD.

## 2018-02-27 NOTE — Telephone Encounter (Signed)
Patient informed. Copy sent to PCP °

## 2018-03-28 DIAGNOSIS — I10 Essential (primary) hypertension: Secondary | ICD-10-CM | POA: Diagnosis not present

## 2018-03-28 DIAGNOSIS — Z299 Encounter for prophylactic measures, unspecified: Secondary | ICD-10-CM | POA: Diagnosis not present

## 2018-03-28 DIAGNOSIS — R509 Fever, unspecified: Secondary | ICD-10-CM | POA: Diagnosis not present

## 2018-03-28 DIAGNOSIS — Z87891 Personal history of nicotine dependence: Secondary | ICD-10-CM | POA: Diagnosis not present

## 2018-03-28 DIAGNOSIS — Z6827 Body mass index (BMI) 27.0-27.9, adult: Secondary | ICD-10-CM | POA: Diagnosis not present

## 2018-03-28 DIAGNOSIS — J069 Acute upper respiratory infection, unspecified: Secondary | ICD-10-CM | POA: Diagnosis not present

## 2018-04-02 DIAGNOSIS — I10 Essential (primary) hypertension: Secondary | ICD-10-CM | POA: Diagnosis not present

## 2018-04-02 DIAGNOSIS — E7800 Pure hypercholesterolemia, unspecified: Secondary | ICD-10-CM | POA: Diagnosis not present

## 2018-04-02 DIAGNOSIS — I2510 Atherosclerotic heart disease of native coronary artery without angina pectoris: Secondary | ICD-10-CM | POA: Diagnosis not present

## 2018-04-30 DIAGNOSIS — R509 Fever, unspecified: Secondary | ICD-10-CM | POA: Diagnosis not present

## 2018-04-30 DIAGNOSIS — K2970 Gastritis, unspecified, without bleeding: Secondary | ICD-10-CM | POA: Diagnosis not present

## 2018-04-30 DIAGNOSIS — Z299 Encounter for prophylactic measures, unspecified: Secondary | ICD-10-CM | POA: Diagnosis not present

## 2018-04-30 DIAGNOSIS — I2510 Atherosclerotic heart disease of native coronary artery without angina pectoris: Secondary | ICD-10-CM | POA: Diagnosis not present

## 2018-04-30 DIAGNOSIS — Z6826 Body mass index (BMI) 26.0-26.9, adult: Secondary | ICD-10-CM | POA: Diagnosis not present

## 2018-04-30 DIAGNOSIS — N183 Chronic kidney disease, stage 3 (moderate): Secondary | ICD-10-CM | POA: Diagnosis not present

## 2018-04-30 DIAGNOSIS — I10 Essential (primary) hypertension: Secondary | ICD-10-CM | POA: Diagnosis not present

## 2018-05-08 ENCOUNTER — Ambulatory Visit (INDEPENDENT_AMBULATORY_CARE_PROVIDER_SITE_OTHER): Payer: Medicare Other | Admitting: Gastroenterology

## 2018-05-08 ENCOUNTER — Encounter: Payer: Self-pay | Admitting: Gastroenterology

## 2018-05-08 ENCOUNTER — Other Ambulatory Visit: Payer: Self-pay

## 2018-05-08 VITALS — BP 103/71 | HR 66 | Temp 98.0°F | Ht 65.0 in | Wt 153.2 lb

## 2018-05-08 DIAGNOSIS — K760 Fatty (change of) liver, not elsewhere classified: Secondary | ICD-10-CM | POA: Diagnosis not present

## 2018-05-08 DIAGNOSIS — I25119 Atherosclerotic heart disease of native coronary artery with unspecified angina pectoris: Secondary | ICD-10-CM

## 2018-05-08 DIAGNOSIS — K219 Gastro-esophageal reflux disease without esophagitis: Secondary | ICD-10-CM | POA: Diagnosis not present

## 2018-05-08 NOTE — Patient Instructions (Signed)
Please continue to monitor for further episodes of nausea and/or vomiting, abdominal discomfort after meals. If you have any, please call and let me know. At that time we would consider upper endoscopy as next step versus gallbladder function test.   Continue omeprazole as before.   Return to the office in one year or call sooner if needed.

## 2018-05-08 NOTE — Progress Notes (Signed)
Primary Care Physician: Kirstie Peri, MD  Primary Gastroenterologist:  Jonette Eva, MD   Chief Complaint  Patient presents with  . Vomiting    1 week ago - was only liquids that he was vomiting. Has not had episode since. He ate a BLT that caused this    HPI: Keith Keith. is a 73 y.o. male here for one-year follow-up.  History of GERD.  On omeprazole daily.  Last colonoscopy in [place] in 2018.  Patient with history of uncomplicated pancreatitis in 2018.  No evidence of cirrhosis.  CT from 08/2016 was personally reviewed by Dr. Darrick Penna with Dr. Tyron Russell in 12/2016, showed uncomplicated pancreatitis. Abd u/s done showing normal gallbladder without gallstones, CBD 3.6 mm.  Patient overall has been feeling well. Over a week ago, he had some vomiting after eating a BLT. Ate at [employer]. No one else got sick and no one else ate what he did. Symptoms lasted for about 24 hours. No vomiting since last Wednesday. Has been able to get back to a normal diet. He thinks the greasy bacon caused his symptoms.   No bowel concerns, no melena, brbpr.   No EGD.     Current Outpatient Medications  Medication Sig Dispense Refill  . aspirin EC 81 MG tablet Take 81 mg by mouth daily.    Marland Kitchen atorvastatin (LIPITOR) 40 MG tablet Take 1 tablet (40 mg total) by mouth every evening. 30 tablet 6  . BRILINTA 90 MG TABS tablet TAKE 1 TABLET TWICE A DAY (NEED APPOINTMENT FOR REFILLS, OVERDUE FOR FOLLOW UP, WAS DUE THIS FEBRUARY) 180 tablet 3  . lisinopril-hydrochlorothiazide (PRINZIDE,ZESTORETIC) 20-12.5 MG tablet TAKE 1 TABLET DAILY 90 tablet 1  . metoprolol tartrate (LOPRESSOR) 25 MG tablet Take 25 mg by mouth 2 (two) times daily.    . nitroGLYCERIN (NITROSTAT) 0.4 MG SL tablet Place 1 tablet (0.4 mg total) under the tongue every 5 (five) minutes as needed for chest pain (up to 3 doses). 75 tablet 1  . omeprazole (PRILOSEC) 40 MG capsule Take 1 capsule (40 mg total) by mouth at bedtime. 30 capsule 6   . promethazine (PHENERGAN) 25 MG tablet as needed.     No current facility-administered medications for this visit.     Allergies as of 05/08/2018  . (No Known Allergies)    ROS:  General: Negative for anorexia, weight loss, fever, chills, fatigue, weakness. ENT: Negative for hoarseness, difficulty swallowing , nasal congestion. CV: Negative for chest pain, angina, palpitations, dyspnea on exertion, peripheral edema.  Respiratory: Negative for dyspnea at rest, dyspnea on exertion, cough, sputum, wheezing.  GI: See history of present illness. GU:  Negative for dysuria, hematuria, urinary incontinence, urinary frequency, nocturnal urination.  Endo: Negative for unusual weight change.    Physical Examination:   BP 103/71   Pulse 66   Temp 98 F (36.7 C) (Oral)   Ht 5\' 5"  (1.651 m)   Wt 153 lb 3.2 oz (69.5 kg)   BMI 25.49 kg/m   General: Well-nourished, well-developed in no acute distress.  Eyes: No icterus. Mouth: Oropharyngeal mucosa moist and pink , no lesions erythema or exudate. Lungs: Clear to auscultation bilaterally.  Heart: Regular rate and rhythm, no murmurs rubs or gallops.  Abdomen: Bowel sounds are normal, nontender, nondistended, no hepatosplenomegaly or masses, no abdominal bruits or hernia , no rebound or guarding.   Extremities: No lower extremity edema. No clubbing or deformities. Neuro: Alert and oriented x 4  Skin: Warm and dry, no jaundice.   Psych: Alert and cooperative, normal mood and affect.  Labs:  Lab Results  Component Value Date   CREATININE 1.56 (H) 11/09/2017   BUN 11 11/09/2017   NA 139 11/09/2017   K 4.1 11/09/2017   CL 100 11/09/2017   CO2 30 11/09/2017   Lab Results  Component Value Date   ALT 21 11/09/2017   AST 27 11/09/2017   ALKPHOS 58 11/09/2017   BILITOT 1.1 11/09/2017   Lab Results  Component Value Date   WBC 6.1 11/09/2017   HGB 15.0 11/09/2017   HCT 44.2 11/09/2017   MCV 89.7 11/09/2017   PLT 110 (L) 11/09/2017     Imaging Studies: No results found.

## 2018-05-09 NOTE — Assessment & Plan Note (Signed)
Symptoms well controlled.  Recent episode of nausea and vomiting possibly foodborne illness.  Cannot exclude biliary etiology such as gallbladder dysfunction, no gallstones noted less than 2 years ago on ultrasound.  Also with history of idiopathic pancreatitis as mentioned in 2018, noted via CT.  Patient was evaluated at outside facility but CT reviewed with Dr. fields/Dr. Tyron Russell.  Subsequent ultrasound with no cholelithiasis.  No known prior history of alcohol abuse.  Continue to monitor symptoms.  If any recurrence, he will let me know.  At that time would consider further work-up.  Continue omeprazole daily.  Otherwise see him back in a year.

## 2018-05-09 NOTE — Assessment & Plan Note (Signed)
Normal LFTs 6 months ago.  BMI of 25.  Return to the office in 1 year.

## 2018-05-10 NOTE — Progress Notes (Signed)
Idiopathic pancreatitis. Discussed with Dr. Darrick Penna. She reports MRI/MRCP offered in 2018 but patient declined. U/S done to evaluate for gallstones and was unremarkable.

## 2018-05-10 NOTE — Progress Notes (Signed)
cc'd to pcp 

## 2018-05-11 DIAGNOSIS — E7800 Pure hypercholesterolemia, unspecified: Secondary | ICD-10-CM | POA: Diagnosis not present

## 2018-05-11 DIAGNOSIS — I2510 Atherosclerotic heart disease of native coronary artery without angina pectoris: Secondary | ICD-10-CM | POA: Diagnosis not present

## 2018-05-11 DIAGNOSIS — I10 Essential (primary) hypertension: Secondary | ICD-10-CM | POA: Diagnosis not present

## 2018-05-22 ENCOUNTER — Other Ambulatory Visit: Payer: Self-pay

## 2018-05-22 ENCOUNTER — Inpatient Hospital Stay (HOSPITAL_COMMUNITY): Payer: Medicare Other | Attending: Hematology

## 2018-05-22 DIAGNOSIS — N182 Chronic kidney disease, stage 2 (mild): Secondary | ICD-10-CM

## 2018-05-22 DIAGNOSIS — I129 Hypertensive chronic kidney disease with stage 1 through stage 4 chronic kidney disease, or unspecified chronic kidney disease: Secondary | ICD-10-CM | POA: Insufficient documentation

## 2018-05-22 DIAGNOSIS — D696 Thrombocytopenia, unspecified: Secondary | ICD-10-CM | POA: Diagnosis not present

## 2018-05-22 LAB — CBC WITH DIFFERENTIAL/PLATELET
Abs Immature Granulocytes: 0.03 10*3/uL (ref 0.00–0.07)
BASOS PCT: 1 %
Basophils Absolute: 0 10*3/uL (ref 0.0–0.1)
Eosinophils Absolute: 0.2 10*3/uL (ref 0.0–0.5)
Eosinophils Relative: 5 %
HCT: 43.3 % (ref 39.0–52.0)
Hemoglobin: 14 g/dL (ref 13.0–17.0)
Immature Granulocytes: 1 %
Lymphocytes Relative: 38 %
Lymphs Abs: 2 10*3/uL (ref 0.7–4.0)
MCH: 29.7 pg (ref 26.0–34.0)
MCHC: 32.3 g/dL (ref 30.0–36.0)
MCV: 91.7 fL (ref 80.0–100.0)
MONOS PCT: 8 %
Monocytes Absolute: 0.4 10*3/uL (ref 0.1–1.0)
NEUTROS ABS: 2.5 10*3/uL (ref 1.7–7.7)
Neutrophils Relative %: 47 %
PLATELETS: 112 10*3/uL — AB (ref 150–400)
RBC: 4.72 MIL/uL (ref 4.22–5.81)
RDW: 14.5 % (ref 11.5–15.5)
WBC: 5.3 10*3/uL (ref 4.0–10.5)
nRBC: 0 % (ref 0.0–0.2)

## 2018-05-22 LAB — COMPREHENSIVE METABOLIC PANEL
ALT: 23 U/L (ref 0–44)
AST: 24 U/L (ref 15–41)
Albumin: 3.7 g/dL (ref 3.5–5.0)
Alkaline Phosphatase: 66 U/L (ref 38–126)
Anion gap: 9 (ref 5–15)
BUN: 11 mg/dL (ref 8–23)
CHLORIDE: 104 mmol/L (ref 98–111)
CO2: 27 mmol/L (ref 22–32)
CREATININE: 1.56 mg/dL — AB (ref 0.61–1.24)
Calcium: 9.4 mg/dL (ref 8.9–10.3)
GFR calc Af Amer: 51 mL/min — ABNORMAL LOW (ref >60)
GFR, EST NON AFRICAN AMERICAN: 44 mL/min — AB (ref >60)
Glucose, Bld: 108 mg/dL — ABNORMAL HIGH (ref 70–99)
Potassium: 4.4 mmol/L (ref 3.5–5.1)
SODIUM: 140 mmol/L (ref 135–145)
Total Bilirubin: 0.7 mg/dL (ref 0.3–1.2)
Total Protein: 7 g/dL (ref 6.5–8.1)

## 2018-05-22 LAB — LACTATE DEHYDROGENASE: LDH: 149 U/L (ref 98–192)

## 2018-05-23 LAB — PROTEIN ELECTROPHORESIS, SERUM
A/G Ratio: 1.2 (ref 0.7–1.7)
ALBUMIN ELP: 3.5 g/dL (ref 2.9–4.4)
ALPHA-1-GLOBULIN: 0.2 g/dL (ref 0.0–0.4)
Alpha-2-Globulin: 1 g/dL (ref 0.4–1.0)
Beta Globulin: 0.9 g/dL (ref 0.7–1.3)
GAMMA GLOBULIN: 0.8 g/dL (ref 0.4–1.8)
Globulin, Total: 3 g/dL (ref 2.2–3.9)
TOTAL PROTEIN ELP: 6.5 g/dL (ref 6.0–8.5)

## 2018-05-24 ENCOUNTER — Other Ambulatory Visit (HOSPITAL_COMMUNITY): Payer: Medicare Other

## 2018-05-24 ENCOUNTER — Other Ambulatory Visit: Payer: Self-pay | Admitting: Cardiology

## 2018-05-29 ENCOUNTER — Inpatient Hospital Stay (HOSPITAL_COMMUNITY): Payer: Medicare Other | Attending: Hematology | Admitting: Hematology

## 2018-05-29 ENCOUNTER — Other Ambulatory Visit: Payer: Self-pay

## 2018-05-29 ENCOUNTER — Encounter (HOSPITAL_COMMUNITY): Payer: Self-pay | Admitting: Hematology

## 2018-05-29 VITALS — BP 148/80 | HR 69 | Temp 98.5°F | Resp 18 | Wt 155.4 lb

## 2018-05-29 DIAGNOSIS — Z7982 Long term (current) use of aspirin: Secondary | ICD-10-CM | POA: Insufficient documentation

## 2018-05-29 DIAGNOSIS — Z79899 Other long term (current) drug therapy: Secondary | ICD-10-CM | POA: Diagnosis not present

## 2018-05-29 DIAGNOSIS — Z87891 Personal history of nicotine dependence: Secondary | ICD-10-CM | POA: Diagnosis not present

## 2018-05-29 DIAGNOSIS — M1990 Unspecified osteoarthritis, unspecified site: Secondary | ICD-10-CM | POA: Diagnosis not present

## 2018-05-29 DIAGNOSIS — I129 Hypertensive chronic kidney disease with stage 1 through stage 4 chronic kidney disease, or unspecified chronic kidney disease: Secondary | ICD-10-CM | POA: Diagnosis not present

## 2018-05-29 DIAGNOSIS — N182 Chronic kidney disease, stage 2 (mild): Secondary | ICD-10-CM | POA: Insufficient documentation

## 2018-05-29 DIAGNOSIS — Z955 Presence of coronary angioplasty implant and graft: Secondary | ICD-10-CM | POA: Diagnosis not present

## 2018-05-29 DIAGNOSIS — D696 Thrombocytopenia, unspecified: Secondary | ICD-10-CM | POA: Insufficient documentation

## 2018-05-29 DIAGNOSIS — I2510 Atherosclerotic heart disease of native coronary artery without angina pectoris: Secondary | ICD-10-CM | POA: Insufficient documentation

## 2018-05-29 DIAGNOSIS — K219 Gastro-esophageal reflux disease without esophagitis: Secondary | ICD-10-CM | POA: Diagnosis not present

## 2018-05-29 NOTE — Progress Notes (Signed)
[facility] [address], [place] [address]   CLINIC:  Medical Oncology/Hematology  PCP:  Kirstie Peri, MD [address] [place] [address] [phone]   REASON FOR VISIT:  Follow-up for thrombocytopenia.    HISTORY OF PRESENT ILLNESS:  [name] is seen for follow-up of thrombocytopenia.  In the previous 6 months, he denies any fevers, night sweats or weight loss.  Denies any infections or hospitalizations.  Denies any ER visits.  Appetite and energy levels are 100%.  His actively playing golf.  He denies any easy bruising or bleeding including nosebleeds, hematuria or hematochezia.    REVIEW OF SYSTEMS:  Review of Systems  All other systems reviewed and are negative.    PAST MEDICAL/SURGICAL HISTORY:  Past Medical History:  Diagnosis Date  . Arthritis   . CAD (coronary artery disease)    a. 05/19/13: DES x 2 to RCA, staged DES to OM 05/22/13, residual LAD/diag disease for Rx mgmt  b. 05/20/14 [place] s/p DES to LAD  . CKD (chronic kidney disease), stage II   . Essential hypertension, benign   . GERD (gastroesophageal reflux disease)   . Thrombocytopenia (HCC)    Past Surgical History:  Procedure Laterality Date  . Bilateral hip replacements    . CARDIAC CATHETERIZATION    . COLONOSCOPY W/ POLYPECTOMY  05/2016   COLON POLYPS REMOVED(>3) [facility]  . CORONARY ANGIOPLASTY WITH STENT PLACEMENT  05/20/2014   mid lad   . CORONARY STENT PLACEMENT  05/19/13  . FRACTIONAL FLOW RESERVE WIRE Right 05/22/2013   Procedure: FRACTIONAL FLOW RESERVE WIRE;  Surgeon: Kathleene Hazel, MD;  Location: [facility] CATH LAB;  Service: Cardiovascular;  Laterality: Right;  . LEFT HEART CATHETERIZATION WITH CORONARY ANGIOGRAM N/A 05/19/2013   Procedure: LEFT HEART CATHETERIZATION WITH CORONARY ANGIOGRAM;  Surgeon: Lennette Bihari, MD;  Location: [facility] CATH LAB;  Service: Cardiovascular;  Laterality: N/A;  . LEFT HEART CATHETERIZATION WITH CORONARY ANGIOGRAM N/A 05/20/2014   Procedure: LEFT HEART CATHETERIZATION WITH CORONARY ANGIOGRAM;  Surgeon: Kathleene Hazel, MD;  Location: [facility] CATH LAB;  Service: Cardiovascular;  Laterality: N/A;  . PERCUTANEOUS CORONARY STENT INTERVENTION (PCI-S) N/A 05/22/2013   Procedure: PERCUTANEOUS CORONARY STENT INTERVENTION (PCI-S);  Surgeon: Kathleene Hazel, MD;  Location: [facility] CATH LAB;  Service: Cardiovascular;  Laterality: N/A;     SOCIAL HISTORY:  Social History   Socioeconomic History  . Marital status: Married    Spouse name: Not on file  . Number of children: Not on file  . Years of education: Not on file  . Highest education level: Not on file  Occupational History  . Not on file  Social Needs  . Financial resource strain: Not on file  . Food insecurity:    Worry: Not on file    Inability: Not on file  . Transportation needs:    Medical: Not on file    Non-medical: Not on file  Tobacco Use  . Smoking status: Former Smoker    Packs/day: 1.00    Years: 38.00    Pack years: 38.00    Types: Cigarettes    Start date: 10/16/1965    Last attempt to quit: 02/21/2002    Years since quitting: 16.2  . Smokeless tobacco: Never Used  Substance and Sexual Activity  . Alcohol use: Yes    Comment: occ beer  . Drug use: No  . Sexual activity: Not on file  Lifestyle  . Physical activity:    Days per  week: Not on file    Minutes per session: Not on file  . Stress: Not on file  Relationships  . Social connections:    Talks on phone: Not on file    Gets together: Not on file    Attends religious service: Not on file    Active member of club or organization: Not on file    Attends meetings of clubs or organizations: Not on file    Relationship status: Not on file  . Intimate partner violence:    Fear of current or ex partner: Not on file    Emotionally abused: Not on file    Physically abused: Not on file    Forced sexual activity: Not on file  Other Topics Concern  . 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 [place] [employer].    FAMILY HISTORY:  Family History  Problem Relation Age of Onset  . Aneurysm Mother   . Heart disease Sister   . Diabetes Sister   . Diabetes Sister   . Colon polyps Sister   . Colon cancer Neg Hx     CURRENT MEDICATIONS:  Outpatient Encounter Medications as of 05/29/2018  Medication Sig  . aspirin EC 81 MG tablet Take 81 mg by mouth daily.  Marland Kitchen atorvastatin (LIPITOR) 40 MG tablet Take 1 tablet (40 mg total) by mouth every evening.  Marland Kitchen BRILINTA 90 MG TABS tablet TAKE 1 TABLET TWICE A DAY (NEED APPOINTMENT FOR REFILLS, OVERDUE FOR FOLLOW UP, WAS DUE THIS FEBRUARY)  . lisinopril-hydrochlorothiazide (PRINZIDE,ZESTORETIC) 20-12.5 MG tablet TAKE 1 TABLET DAILY  . metoprolol tartrate (LOPRESSOR) 25 MG tablet Take 25 mg by mouth 2 (two) times daily.  . nitroGLYCERIN (NITROSTAT) 0.4 MG SL tablet DISSOLVE 1 TABLET UNDER THE TONGUE EVERY 5 MINUTES AS NEEDED FOR CHEST PAIN (UP TO 3 DOSES)  . omeprazole (PRILOSEC) 40 MG capsule Take 1 capsule (40 mg total) by mouth at bedtime.  . promethazine (PHENERGAN) 25 MG tablet as needed.   No facility-administered encounter medications on file as of 05/29/2018.     ALLERGIES:  No Known Allergies   PHYSICAL EXAM:  ECOG Performance status: 1  Vitals:   05/29/18 0808  BP: (!) 148/80  Pulse: 69  Resp: 18  Temp: 98.5 F (36.9 C)  SpO2: 100%   Filed Weights   05/29/18 0808  Weight: 155 lb 6.4 oz (70.5 kg)    Physical Exam Vitals signs reviewed.  Constitutional:      Appearance: Normal appearance.  Cardiovascular:     Rate and Rhythm: Normal rate and regular rhythm.     Heart sounds: Normal heart sounds.  Pulmonary:     Effort: Pulmonary effort is normal.     Breath sounds: Normal breath sounds.  Abdominal:     General: Bowel sounds are normal. There is no distension.     Palpations: Abdomen is soft.  Musculoskeletal:        General: No swelling.  Skin:    General: Skin is warm.  Neurological:      General: No focal deficit present.     Mental Status: He is alert and oriented to person, place, and time.  Psychiatric:        Mood and Affect: Mood normal.        Behavior: Behavior normal.      LABORATORY DATA:  I have reviewed the labs as listed.  CBC    Component Value Date/Time   WBC 5.3 05/22/2018  0922   RBC 4.72 05/22/2018 0922   HGB 14.0 05/22/2018 0922   HCT 43.3 05/22/2018 0922   PLT 112 (L) 05/22/2018 0922   MCV 91.7 05/22/2018 0922   MCH 29.7 05/22/2018 0922   MCHC 32.3 05/22/2018 0922   RDW 14.5 05/22/2018 0922   LYMPHSABS 2.0 05/22/2018 0922   MONOABS 0.4 05/22/2018 0922   EOSABS 0.2 05/22/2018 0922   BASOSABS 0.0 05/22/2018 0922   CMP Latest Ref Rng & Units 05/22/2018 11/09/2017 07/14/2017  Glucose 70 - 99 mg/dL 299(M) 426(S) 341(D)  BUN 8 - 23 mg/dL 11 11 15   Creatinine 0.61 - 1.24 mg/dL 6.22(W) 9.79(G) 9.21(J)  Sodium 135 - 145 mmol/L 140 139 136  Potassium 3.5 - 5.1 mmol/L 4.4 4.1 3.7  Chloride 98 - 111 mmol/L 104 100 99(L)  CO2 22 - 32 mmol/L 27 30 27   Calcium 8.9 - 10.3 mg/dL 9.4 9.7 9.7  Total Protein 6.5 - 8.1 g/dL 7.0 7.2 -  Total Bilirubin 0.3 - 1.2 mg/dL 0.7 1.1 -  Alkaline Phos 38 - 126 U/L 66 58 -  AST 15 - 41 U/L 24 27 -  ALT 0 - 44 U/L 23 21 -       ASSESSMENT & PLAN:   Thrombocytopenia (HCC) Mild to moderate thrombocytopenia: - He has history of thrombocytopenia since March 2015. - Ultrasound of the abdomen from 02/02/2017 shows normal-sized spleen with increased hepatic echotexture compatible with fatty infiltrative change. -Nutritional deficiency and infectious causes were ruled out. -Etiology includes immune thrombocytopenia versus MDS. -He denies any fevers, night sweats or weight loss.  Denies any infections or hospitalizations. - He is on Brilinta for CAD for last several years.  Denies any bleeding complications. - We reviewed his blood work.  Platelet count is 112. -We will see him back in 6 months for follow-up.    CKD: -His creatinine is stable around 1.5.     Orders placed this encounter:  Orders Placed This Encounter  Procedures  . Comprehensive metabolic panel  . CBC with Differential      Doreatha Massed, MD [facility] [phone]

## 2018-05-29 NOTE — Assessment & Plan Note (Signed)
Mild to moderate thrombocytopenia: - He has history of thrombocytopenia since March 2015. - Ultrasound of the abdomen from 02/02/2017 shows normal-sized spleen with increased hepatic echotexture compatible with fatty infiltrative change. -Nutritional deficiency and infectious causes were ruled out. -Etiology includes immune thrombocytopenia versus MDS. -He denies any fevers, night sweats or weight loss.  Denies any infections or hospitalizations. - He is on Brilinta for CAD for last several years.  Denies any bleeding complications. - We reviewed his blood work.  Platelet count is 112. -We will see him back in 6 months for follow-up.   CKD: -His creatinine is stable around 1.5.

## 2018-05-31 ENCOUNTER — Ambulatory Visit (HOSPITAL_COMMUNITY): Payer: Medicare Other | Admitting: Hematology

## 2018-06-01 ENCOUNTER — Ambulatory Visit (HOSPITAL_COMMUNITY): Payer: Medicare Other | Admitting: Hematology

## 2018-06-21 DIAGNOSIS — I2510 Atherosclerotic heart disease of native coronary artery without angina pectoris: Secondary | ICD-10-CM | POA: Diagnosis not present

## 2018-06-21 DIAGNOSIS — E7800 Pure hypercholesterolemia, unspecified: Secondary | ICD-10-CM | POA: Diagnosis not present

## 2018-06-21 DIAGNOSIS — I10 Essential (primary) hypertension: Secondary | ICD-10-CM | POA: Diagnosis not present

## 2018-06-29 ENCOUNTER — Telehealth: Payer: Self-pay | Admitting: *Deleted

## 2018-06-29 NOTE — Telephone Encounter (Signed)
Patient verbally consented for telehealth visits with [facility] and understands that his insurance company will be billed for the encounter.   Aware to have vitals available 

## 2018-07-03 NOTE — Progress Notes (Signed)
Virtual Visit via Telephone Note   This visit type was conducted due to national recommendations for restrictions regarding the COVID-19 Pandemic (e.g. social distancing) in an effort to limit this patient's exposure and mitigate transmission in our community.  Due to his co-morbid illnesses, this patient is at least at moderate risk for complications without adequate follow up.  This format is felt to be most appropriate for this patient at this time.  The patient did not have access to video technology/had technical difficulties with video requiring transitioning to audio format only (telephone).  All issues noted in this document were discussed and addressed.  No physical exam could be performed with this format.  Please refer to the patient's chart for his  consent to telehealth for [facility].   Date:  07/04/2018   ID:  Keith Journey., DOB [birth year], MRN [id number]  Patient Location: Home Provider Location: Office  PCP:  Kirstie Peri, MD  Cardiologist:  Nona Dell, MD  Evaluation Performed:  Follow-Up Visit  Chief Complaint:   Cardiac follow-up  History of Present Illness:    Keith Camden. is a 73 y.o. male last seen in November 2019.  He did not have video access today and we spoke by phone.  He does not report any active angina symptoms or nitroglycerin use since last encounter.  He has been playing golf and walking for exercise.  Reports NYHA class II dyspnea, no palpitations or syncope.  Follow-up Lexiscan Myoview in January of this year was low risk as outlined below.  We discussed the results again today.  I reviewed his medications which are outlined below and stable from a cardiac perspective.  He continues to get his lipids checked through the [facility] medical system, remains on Lipitor.  The patient does not have symptoms concerning for COVID-19 infection (fever, chills, cough, or new shortness of breath).    Past Medical History:  Diagnosis Date  .  Arthritis   . CAD (coronary artery disease)    a. 05/19/13: DES x 2 to RCA, staged DES to OM 05/22/13, residual LAD/diag disease for Rx mgmt  b. 05/20/14 [place] s/p DES to LAD  . CKD (chronic kidney disease), stage II   . Essential hypertension, benign   . GERD (gastroesophageal reflux disease)   . Thrombocytopenia (HCC)    Past Surgical History:  Procedure Laterality Date  . Bilateral hip replacements    . CARDIAC CATHETERIZATION    . COLONOSCOPY W/ POLYPECTOMY  05/2016   COLON POLYPS REMOVED(>3) [facility]  . CORONARY ANGIOPLASTY WITH STENT PLACEMENT  05/20/2014   mid lad   . CORONARY STENT PLACEMENT  05/19/13  . FRACTIONAL FLOW RESERVE WIRE Right 05/22/2013   Procedure: FRACTIONAL FLOW RESERVE WIRE;  Surgeon: Kathleene Hazel, MD;  Location: [facility] CATH LAB;  Service: Cardiovascular;  Laterality: Right;  . LEFT HEART CATHETERIZATION WITH CORONARY ANGIOGRAM N/A 05/19/2013   Procedure: LEFT HEART CATHETERIZATION WITH CORONARY ANGIOGRAM;  Surgeon: Lennette Bihari, MD;  Location: [facility] CATH LAB;  Service: Cardiovascular;  Laterality: N/A;  . LEFT HEART CATHETERIZATION WITH CORONARY ANGIOGRAM N/A 05/20/2014   Procedure: LEFT HEART CATHETERIZATION WITH CORONARY ANGIOGRAM;  Surgeon: Kathleene Hazel, MD;  Location: [facility] CATH LAB;  Service: Cardiovascular;  Laterality: N/A;  . PERCUTANEOUS CORONARY STENT INTERVENTION (PCI-S) N/A 05/22/2013   Procedure: PERCUTANEOUS CORONARY STENT INTERVENTION (PCI-S);  Surgeon: Kathleene Hazel, MD;  Location: [facility] CATH LAB;  Service: Cardiovascular;  Laterality: N/A;     Current  Meds  Medication Sig  . aspirin EC 81 MG tablet Take 81 mg by mouth daily.  Marland Kitchen atorvastatin (LIPITOR) 40 MG tablet Take 1 tablet (40 mg total) by mouth every evening.  Marland Kitchen BRILINTA 90 MG TABS tablet TAKE 1 TABLET TWICE A DAY (NEED APPOINTMENT FOR REFILLS, OVERDUE FOR FOLLOW UP, WAS DUE THIS FEBRUARY)  . Cholecalciferol (VITAMIN D3) 25 MCG (1000 UT) CAPS Take 1 capsule by mouth daily.  Marland Kitchen  lisinopril-hydrochlorothiazide (PRINZIDE,ZESTORETIC) 20-12.5 MG tablet TAKE 1 TABLET DAILY  . metoprolol tartrate (LOPRESSOR) 25 MG tablet Take 25 mg by mouth 2 (two) times daily.  . nitroGLYCERIN (NITROSTAT) 0.4 MG SL tablet DISSOLVE 1 TABLET UNDER THE TONGUE EVERY 5 MINUTES AS NEEDED FOR CHEST PAIN (UP TO 3 DOSES)  . omeprazole (PRILOSEC) 40 MG capsule Take 1 capsule (40 mg total) by mouth at bedtime.  . promethazine (PHENERGAN) 25 MG tablet Take 25 mg by mouth as needed.   . vitamin B-12 (CYANOCOBALAMIN) 1000 MCG tablet Take 1,000 mcg by mouth daily.     Allergies:   Patient has no known allergies.   Social History   Tobacco Use  . Smoking status: Former Smoker    Packs/day: 1.00    Years: 38.00    Pack years: 38.00    Types: Cigarettes    Start date: 10/16/1965    Last attempt to quit: 02/21/2002    Years since quitting: 16.3  . Smokeless tobacco: Never Used  Substance Use Topics  . Alcohol use: Yes    Comment: occ beer  . Drug use: No     Family Hx: The patient's family history includes Aneurysm in his mother; Colon polyps in his sister; Diabetes in his sister and sister; Heart disease in his sister. There is no history of Colon cancer.  ROS:   Please see the history of present illness.    All other systems reviewed and are negative.   Prior CV studies:   The following studies were reviewed today:  Echocardiogram 05/20/2014: Study Conclusions  - Left ventricle: The cavity size was normal. Wall thickness was increased in a pattern of moderate LVH. Systolic function was normal. The estimated ejection fraction was in the range of 60% to 65%. Wall motion was normal; there were no regional wall motion abnormalities. Doppler parameters are consistent with abnormal left ventricular relaxation (grade 1 diastolic dysfunction). The E/e&' ratio is between 8-15, suggesting indeterminate LV filling pressure. - Aortic valve: Sclerosis without stenosis. There was no  regurgitation. - Mitral valve: There was no significant regurgitation. - Left atrium: The atrium was normal in size. - Right ventricle: The cavity size was normal. Wall thickness was normal. Systolic function is reduced. - Right atrium: The atrium was normal in size.  Impressions:  - LVEF 60-65%, moderate LVH, diastolic dysfunction, indeterminate (but likely elevated LV Filling pressure), normal LA size, mildly reduced RV function by TAPSE, no significant TR  Lexiscan Myoview 02/26/2018:  There was no ST segment deviation noted during stress.  Findings consistent with inferior/inferoseptal/apical prior myocardial infarction. There is no current myocardium at jeopardy.  The left ventricular ejection fraction is hyperdynamic (>65%).  This is a low risk study.  Labs/Other Tests and Data Reviewed:    EKG:  An ECG dated 07/14/2017 was personally reviewed today and demonstrated:  Sinus rhythm with borderline low voltage.  Recent Labs: 05/22/2018: ALT 23; BUN 11; Creatinine, Ser 1.56; Hemoglobin 14.0; Platelets 112; Potassium 4.4; Sodium 140   Recent Lipid Panel Lab Results  Component  Value Date/Time   CHOL 128 05/20/2013 12:47 AM   TRIG 92 05/20/2013 12:47 AM   HDL 42 05/20/2013 12:47 AM   CHOLHDL 3.0 05/20/2013 12:47 AM   LDLCALC 68 05/20/2013 12:47 AM    Wt Readings from Last 3 Encounters:  07/04/18 155 lb (70.3 kg)  05/29/18 155 lb 6.4 oz (70.5 kg)  05/08/18 153 lb 3.2 oz (69.5 kg)     Objective:    Vital Signs:  BP 123/68   Pulse 68   Temp 98.6 F (37 C)   Ht  (1.651 m)   Wt 155 lb (70.3 kg)   BMI 25.79 kg/m    Patient spoke in full sentences on the phone, no breathlessness. Voice tone and speech pattern were normal. No audible wheezing.  ASSESSMENT & PLAN:    1.  CAD status post DES x2 to the RCA with staged to DES to the OM in 2015 followed by DES to the LAD in 2016.  He reports no active angina symptoms on medical therapy and underwent a low  risk Myoview study earlier this year.  No changes were made today.  2.  Mixed hyperlipidemia, he remains on Lipitor with follow-up lab work through the [facility] system.  3.  CKD stage III, creatinine 1.56.  4.  Essential hypertension, blood pressure is well controlled today.  No changes made to current regimen.  COVID-19 Education: The signs and symptoms of COVID-19 were discussed with the patient and how to seek care for testing (follow up with PCP or arrange E-visit).  The importance of social distancing was discussed today.  Time:   Today, I have spent 6 minutes with the patient with telehealth technology discussing the above problems.     Medication Adjustments/Labs and Tests Ordered: Current medicines are reviewed at length with the patient today.  Concerns regarding medicines are outlined above.   Tests Ordered: No orders of the defined types were placed in this encounter.   Medication Changes: No orders of the defined types were placed in this encounter.   Disposition:  Follow up 6 months in [place] office.  Signed, Nona Dell, MD  07/04/2018 11:11 AM    [facility]

## 2018-07-04 ENCOUNTER — Encounter: Payer: Self-pay | Admitting: Cardiology

## 2018-07-04 ENCOUNTER — Telehealth (INDEPENDENT_AMBULATORY_CARE_PROVIDER_SITE_OTHER): Payer: Medicare Other | Admitting: Cardiology

## 2018-07-04 ENCOUNTER — Other Ambulatory Visit: Payer: Self-pay | Admitting: Cardiology

## 2018-07-04 VITALS — BP 123/68 | HR 68 | Temp 98.6°F | Ht 65.0 in | Wt 155.0 lb

## 2018-07-04 DIAGNOSIS — N183 Chronic kidney disease, stage 3 unspecified: Secondary | ICD-10-CM

## 2018-07-04 DIAGNOSIS — Z7189 Other specified counseling: Secondary | ICD-10-CM

## 2018-07-04 DIAGNOSIS — I25119 Atherosclerotic heart disease of native coronary artery with unspecified angina pectoris: Secondary | ICD-10-CM | POA: Diagnosis not present

## 2018-07-04 DIAGNOSIS — I10 Essential (primary) hypertension: Secondary | ICD-10-CM

## 2018-07-04 DIAGNOSIS — E782 Mixed hyperlipidemia: Secondary | ICD-10-CM | POA: Diagnosis not present

## 2018-07-04 NOTE — Patient Instructions (Addendum)

## 2018-07-20 DIAGNOSIS — Z6826 Body mass index (BMI) 26.0-26.9, adult: Secondary | ICD-10-CM | POA: Diagnosis not present

## 2018-07-20 DIAGNOSIS — E7800 Pure hypercholesterolemia, unspecified: Secondary | ICD-10-CM | POA: Diagnosis not present

## 2018-07-20 DIAGNOSIS — H6121 Impacted cerumen, right ear: Secondary | ICD-10-CM | POA: Diagnosis not present

## 2018-07-20 DIAGNOSIS — M7710 Lateral epicondylitis, unspecified elbow: Secondary | ICD-10-CM | POA: Diagnosis not present

## 2018-07-20 DIAGNOSIS — Z299 Encounter for prophylactic measures, unspecified: Secondary | ICD-10-CM | POA: Diagnosis not present

## 2018-07-20 DIAGNOSIS — I2510 Atherosclerotic heart disease of native coronary artery without angina pectoris: Secondary | ICD-10-CM | POA: Diagnosis not present

## 2018-07-20 DIAGNOSIS — I10 Essential (primary) hypertension: Secondary | ICD-10-CM | POA: Diagnosis not present

## 2018-08-07 DIAGNOSIS — I10 Essential (primary) hypertension: Secondary | ICD-10-CM | POA: Diagnosis not present

## 2018-08-07 DIAGNOSIS — M7710 Lateral epicondylitis, unspecified elbow: Secondary | ICD-10-CM | POA: Diagnosis not present

## 2018-08-07 DIAGNOSIS — Z299 Encounter for prophylactic measures, unspecified: Secondary | ICD-10-CM | POA: Diagnosis not present

## 2018-08-07 DIAGNOSIS — I2510 Atherosclerotic heart disease of native coronary artery without angina pectoris: Secondary | ICD-10-CM | POA: Diagnosis not present

## 2018-08-07 DIAGNOSIS — Z6826 Body mass index (BMI) 26.0-26.9, adult: Secondary | ICD-10-CM | POA: Diagnosis not present

## 2018-08-07 DIAGNOSIS — N183 Chronic kidney disease, stage 3 (moderate): Secondary | ICD-10-CM | POA: Diagnosis not present

## 2018-08-22 DIAGNOSIS — I2510 Atherosclerotic heart disease of native coronary artery without angina pectoris: Secondary | ICD-10-CM | POA: Diagnosis not present

## 2018-08-22 DIAGNOSIS — I10 Essential (primary) hypertension: Secondary | ICD-10-CM | POA: Diagnosis not present

## 2018-08-22 DIAGNOSIS — E7800 Pure hypercholesterolemia, unspecified: Secondary | ICD-10-CM | POA: Diagnosis not present

## 2018-11-12 DIAGNOSIS — Z1331 Encounter for screening for depression: Secondary | ICD-10-CM | POA: Diagnosis not present

## 2018-11-12 DIAGNOSIS — R5383 Other fatigue: Secondary | ICD-10-CM | POA: Diagnosis not present

## 2018-11-12 DIAGNOSIS — Z7189 Other specified counseling: Secondary | ICD-10-CM | POA: Diagnosis not present

## 2018-11-12 DIAGNOSIS — Z299 Encounter for prophylactic measures, unspecified: Secondary | ICD-10-CM | POA: Diagnosis not present

## 2018-11-12 DIAGNOSIS — E7800 Pure hypercholesterolemia, unspecified: Secondary | ICD-10-CM | POA: Diagnosis not present

## 2018-11-12 DIAGNOSIS — Z6827 Body mass index (BMI) 27.0-27.9, adult: Secondary | ICD-10-CM | POA: Diagnosis not present

## 2018-11-12 DIAGNOSIS — N183 Chronic kidney disease, stage 3 (moderate): Secondary | ICD-10-CM | POA: Diagnosis not present

## 2018-11-12 DIAGNOSIS — Z0000 Encounter for general adult medical examination without abnormal findings: Secondary | ICD-10-CM | POA: Diagnosis not present

## 2018-11-12 DIAGNOSIS — Z1339 Encounter for screening examination for other mental health and behavioral disorders: Secondary | ICD-10-CM | POA: Diagnosis not present

## 2018-11-15 DIAGNOSIS — E7800 Pure hypercholesterolemia, unspecified: Secondary | ICD-10-CM | POA: Diagnosis not present

## 2018-11-15 DIAGNOSIS — Z79899 Other long term (current) drug therapy: Secondary | ICD-10-CM | POA: Diagnosis not present

## 2018-11-15 DIAGNOSIS — Z125 Encounter for screening for malignant neoplasm of prostate: Secondary | ICD-10-CM | POA: Diagnosis not present

## 2018-11-15 DIAGNOSIS — R5383 Other fatigue: Secondary | ICD-10-CM | POA: Diagnosis not present

## 2018-11-19 DIAGNOSIS — Z23 Encounter for immunization: Secondary | ICD-10-CM | POA: Diagnosis not present

## 2018-11-21 ENCOUNTER — Other Ambulatory Visit (HOSPITAL_COMMUNITY): Payer: Self-pay | Admitting: *Deleted

## 2018-11-22 ENCOUNTER — Inpatient Hospital Stay (HOSPITAL_COMMUNITY): Payer: Medicare Other | Attending: Hematology

## 2018-11-22 DIAGNOSIS — Z87891 Personal history of nicotine dependence: Secondary | ICD-10-CM | POA: Insufficient documentation

## 2018-11-22 DIAGNOSIS — N189 Chronic kidney disease, unspecified: Secondary | ICD-10-CM | POA: Insufficient documentation

## 2018-11-22 DIAGNOSIS — Z7982 Long term (current) use of aspirin: Secondary | ICD-10-CM | POA: Insufficient documentation

## 2018-11-22 DIAGNOSIS — I2510 Atherosclerotic heart disease of native coronary artery without angina pectoris: Secondary | ICD-10-CM | POA: Insufficient documentation

## 2018-11-22 DIAGNOSIS — K219 Gastro-esophageal reflux disease without esophagitis: Secondary | ICD-10-CM | POA: Insufficient documentation

## 2018-11-22 DIAGNOSIS — M1990 Unspecified osteoarthritis, unspecified site: Secondary | ICD-10-CM | POA: Insufficient documentation

## 2018-11-22 DIAGNOSIS — D696 Thrombocytopenia, unspecified: Secondary | ICD-10-CM | POA: Insufficient documentation

## 2018-11-22 DIAGNOSIS — Z79899 Other long term (current) drug therapy: Secondary | ICD-10-CM | POA: Insufficient documentation

## 2018-11-22 DIAGNOSIS — I129 Hypertensive chronic kidney disease with stage 1 through stage 4 chronic kidney disease, or unspecified chronic kidney disease: Secondary | ICD-10-CM | POA: Insufficient documentation

## 2018-11-28 ENCOUNTER — Ambulatory Visit (HOSPITAL_COMMUNITY): Payer: Medicare Other | Admitting: Hematology

## 2018-12-05 ENCOUNTER — Inpatient Hospital Stay (HOSPITAL_COMMUNITY): Payer: Medicare Other

## 2018-12-05 ENCOUNTER — Other Ambulatory Visit: Payer: Self-pay

## 2018-12-05 DIAGNOSIS — I2510 Atherosclerotic heart disease of native coronary artery without angina pectoris: Secondary | ICD-10-CM | POA: Diagnosis not present

## 2018-12-05 DIAGNOSIS — Z87891 Personal history of nicotine dependence: Secondary | ICD-10-CM | POA: Diagnosis not present

## 2018-12-05 DIAGNOSIS — D696 Thrombocytopenia, unspecified: Secondary | ICD-10-CM | POA: Diagnosis not present

## 2018-12-05 DIAGNOSIS — M1990 Unspecified osteoarthritis, unspecified site: Secondary | ICD-10-CM | POA: Diagnosis not present

## 2018-12-05 DIAGNOSIS — Z79899 Other long term (current) drug therapy: Secondary | ICD-10-CM | POA: Diagnosis not present

## 2018-12-05 DIAGNOSIS — Z7982 Long term (current) use of aspirin: Secondary | ICD-10-CM | POA: Diagnosis not present

## 2018-12-05 DIAGNOSIS — K219 Gastro-esophageal reflux disease without esophagitis: Secondary | ICD-10-CM | POA: Diagnosis not present

## 2018-12-05 DIAGNOSIS — N189 Chronic kidney disease, unspecified: Secondary | ICD-10-CM | POA: Diagnosis not present

## 2018-12-05 DIAGNOSIS — I129 Hypertensive chronic kidney disease with stage 1 through stage 4 chronic kidney disease, or unspecified chronic kidney disease: Secondary | ICD-10-CM | POA: Diagnosis not present

## 2018-12-05 LAB — CBC WITH DIFFERENTIAL/PLATELET
Abs Immature Granulocytes: 0.03 10*3/uL (ref 0.00–0.07)
Basophils Absolute: 0 10*3/uL (ref 0.0–0.1)
Basophils Relative: 1 %
Eosinophils Absolute: 0.2 10*3/uL (ref 0.0–0.5)
Eosinophils Relative: 3 %
HCT: 43.4 % (ref 39.0–52.0)
Hemoglobin: 13.8 g/dL (ref 13.0–17.0)
Immature Granulocytes: 0 %
Lymphocytes Relative: 30 %
Lymphs Abs: 2.1 10*3/uL (ref 0.7–4.0)
MCH: 28.8 pg (ref 26.0–34.0)
MCHC: 31.8 g/dL (ref 30.0–36.0)
MCV: 90.4 fL (ref 80.0–100.0)
Monocytes Absolute: 0.6 10*3/uL (ref 0.1–1.0)
Monocytes Relative: 8 %
Neutro Abs: 4.2 10*3/uL (ref 1.7–7.7)
Neutrophils Relative %: 58 %
Platelets: 126 10*3/uL — ABNORMAL LOW (ref 150–400)
RBC: 4.8 MIL/uL (ref 4.22–5.81)
RDW: 14.3 % (ref 11.5–15.5)
WBC: 7.2 10*3/uL (ref 4.0–10.5)
nRBC: 0 % (ref 0.0–0.2)

## 2018-12-05 LAB — COMPREHENSIVE METABOLIC PANEL
ALT: 21 U/L (ref 0–44)
AST: 27 U/L (ref 15–41)
Albumin: 4 g/dL (ref 3.5–5.0)
Alkaline Phosphatase: 55 U/L (ref 38–126)
Anion gap: 9 (ref 5–15)
BUN: 16 mg/dL (ref 8–23)
CO2: 26 mmol/L (ref 22–32)
Calcium: 9.5 mg/dL (ref 8.9–10.3)
Chloride: 101 mmol/L (ref 98–111)
Creatinine, Ser: 1.79 mg/dL — ABNORMAL HIGH (ref 0.61–1.24)
GFR calc Af Amer: 43 mL/min — ABNORMAL LOW (ref >60)
GFR calc non Af Amer: 37 mL/min — ABNORMAL LOW (ref >60)
Glucose, Bld: 125 mg/dL — ABNORMAL HIGH (ref 70–99)
Potassium: 4.1 mmol/L (ref 3.5–5.1)
Sodium: 136 mmol/L (ref 135–145)
Total Bilirubin: 0.6 mg/dL (ref 0.3–1.2)
Total Protein: 7.3 g/dL (ref 6.5–8.1)

## 2018-12-07 ENCOUNTER — Other Ambulatory Visit (HOSPITAL_COMMUNITY): Payer: Medicare Other

## 2018-12-12 ENCOUNTER — Other Ambulatory Visit: Payer: Self-pay

## 2018-12-12 ENCOUNTER — Inpatient Hospital Stay (HOSPITAL_BASED_OUTPATIENT_CLINIC_OR_DEPARTMENT_OTHER): Payer: Medicare Other | Admitting: Hematology

## 2018-12-12 VITALS — BP 139/77 | HR 75 | Temp 97.8°F | Resp 18 | Wt 157.9 lb

## 2018-12-12 DIAGNOSIS — M1990 Unspecified osteoarthritis, unspecified site: Secondary | ICD-10-CM | POA: Diagnosis not present

## 2018-12-12 DIAGNOSIS — K219 Gastro-esophageal reflux disease without esophagitis: Secondary | ICD-10-CM | POA: Diagnosis not present

## 2018-12-12 DIAGNOSIS — D696 Thrombocytopenia, unspecified: Secondary | ICD-10-CM | POA: Diagnosis not present

## 2018-12-12 DIAGNOSIS — I129 Hypertensive chronic kidney disease with stage 1 through stage 4 chronic kidney disease, or unspecified chronic kidney disease: Secondary | ICD-10-CM | POA: Diagnosis not present

## 2018-12-12 DIAGNOSIS — N189 Chronic kidney disease, unspecified: Secondary | ICD-10-CM | POA: Diagnosis not present

## 2018-12-12 DIAGNOSIS — I2510 Atherosclerotic heart disease of native coronary artery without angina pectoris: Secondary | ICD-10-CM | POA: Diagnosis not present

## 2018-12-12 NOTE — Progress Notes (Signed)
[facility] [address], [place] [address]   CLINIC:  Medical Oncology/Hematology  PCP:  Kirstie Peri, MD [address] [place] [address] [phone]   REASON FOR VISIT:  Follow-up for thrombocytopenia.    HISTORY OF PRESENT ILLNESS:  Mr. Brackenbury is being seen for follow-up of thrombocytopenia.  Appetite and energy levels are reported 100%.  No recent infections or hospitalizations.  Denies any easy bruising, nosebleeds, hematuria, bleeding per rectum or melena.  No new medications reported.    REVIEW OF SYSTEMS:  Review of Systems  All other systems reviewed and are negative.    PAST MEDICAL/SURGICAL HISTORY:  Past Medical History:  Diagnosis Date  . Arthritis   . CAD (coronary artery disease)    a. 05/19/13: DES x 2 to RCA, staged DES to OM 05/22/13, residual LAD/diag disease for Rx mgmt  b. 05/20/14 [place] s/p DES to LAD  . CKD (chronic kidney disease), stage II   . Essential hypertension, benign   . GERD (gastroesophageal reflux disease)   . Thrombocytopenia (HCC)    Past Surgical History:  Procedure Laterality Date  . Bilateral hip replacements    . CARDIAC CATHETERIZATION    . COLONOSCOPY W/ POLYPECTOMY  05/2016   COLON POLYPS REMOVED(>3) [facility]  . CORONARY ANGIOPLASTY WITH STENT PLACEMENT  05/20/2014   mid lad   . CORONARY STENT PLACEMENT  05/19/13  . FRACTIONAL FLOW RESERVE WIRE Right 05/22/2013   Procedure: FRACTIONAL FLOW RESERVE WIRE;  Surgeon: Kathleene Hazel, MD;  Location: [facility] CATH LAB;  Service: Cardiovascular;  Laterality: Right;  . LEFT HEART CATHETERIZATION WITH CORONARY ANGIOGRAM N/A 05/19/2013   Procedure: LEFT HEART CATHETERIZATION WITH CORONARY ANGIOGRAM;  Surgeon: Lennette Bihari, MD;  Location: [facility] CATH LAB;  Service: Cardiovascular;  Laterality: N/A;  . LEFT HEART CATHETERIZATION WITH CORONARY ANGIOGRAM N/A 05/20/2014   Procedure: LEFT HEART CATHETERIZATION WITH CORONARY ANGIOGRAM;  Surgeon: Kathleene Hazel, MD;   Location: [facility] CATH LAB;  Service: Cardiovascular;  Laterality: N/A;  . PERCUTANEOUS CORONARY STENT INTERVENTION (PCI-S) N/A 05/22/2013   Procedure: PERCUTANEOUS CORONARY STENT INTERVENTION (PCI-S);  Surgeon: Kathleene Hazel, MD;  Location: [facility] CATH LAB;  Service: Cardiovascular;  Laterality: N/A;     SOCIAL HISTORY:  Social History   Socioeconomic History  . Marital status: Married    Spouse name: Not on file  . Number of children: Not on file  . Years of education: Not on file  . Highest education level: Not on file  Occupational History  . Not on file  Social Needs  . Financial resource strain: Not on file  . Food insecurity    Worry: Not on file    Inability: Not on file  . Transportation needs    Medical: Not on file    Non-medical: Not on file  Tobacco Use  . Smoking status: Former Smoker    Packs/day: 1.00    Years: 38.00    Pack years: 38.00    Types: Cigarettes    Start date: 10/16/1965    Quit date: 02/21/2002    Years since quitting: 16.8  . Smokeless tobacco: Never Used  Substance and Sexual Activity  . Alcohol use: Yes    Comment: occ beer  . Drug use: No  . Sexual activity: Not on file  Lifestyle  . Physical activity    Days per week: Not on file    Minutes per session: Not on file  . Stress: Not on file  Relationships  . Social Musician on phone: Not on file    Gets together: Not on file    Attends religious service: Not on file    Active member of club or organization: Not on file    Attends meetings of clubs or organizations: Not on file    Relationship status: Not on file  . Intimate partner violence    Fear of current or ex partner: Not on file    Emotionally abused: Not on file    Physically abused: Not on file    Forced sexual activity: Not on file  Other Topics Concern  . 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    FAMILY HISTORY:  Family History  Problem Relation Age of Onset  .  Aneurysm Mother   . Heart disease Sister   . Diabetes Sister   . Diabetes Sister   . Colon polyps Sister   . Colon cancer Neg Hx     CURRENT MEDICATIONS:  Outpatient Encounter Medications as of 12/12/2018  Medication Sig  . aspirin EC 81 MG tablet Take 81 mg by mouth daily.  Marland Kitchen atorvastatin (LIPITOR) 40 MG tablet Take 1 tablet (40 mg total) by mouth every evening.  Marland Kitchen BRILINTA 90 MG TABS tablet TAKE 1 TABLET TWICE A DAY (NEED APPOINTMENT FOR REFILLS, OVERDUE FOR FOLLOW UP, WAS DUE THIS FEBRUARY)  . Cholecalciferol (VITAMIN D3) 25 MCG (1000 UT) CAPS Take 1 capsule by mouth daily.  Marland Kitchen lisinopril-hydrochlorothiazide (PRINZIDE,ZESTORETIC) 20-12.5 MG tablet TAKE 1 TABLET DAILY  . metoprolol tartrate (LOPRESSOR) 25 MG tablet Take 25 mg by mouth 2 (two) times daily.  Marland Kitchen omeprazole (PRILOSEC) 40 MG capsule Take 1 capsule (40 mg total) by mouth at bedtime.  . promethazine (PHENERGAN) 25 MG tablet Take 25 mg by mouth as needed.   . vitamin B-12 (CYANOCOBALAMIN) 1000 MCG tablet Take 1,000 mcg by mouth daily.  . nitroGLYCERIN (NITROSTAT) 0.4 MG SL tablet DISSOLVE 1 TABLET UNDER THE TONGUE EVERY 5 MINUTES AS NEEDED FOR CHEST PAIN (UP TO 3 DOSES) (Patient not taking: Reported on 12/12/2018)   No facility-administered encounter medications on file as of 12/12/2018.     ALLERGIES:  No Known Allergies   PHYSICAL EXAM:  ECOG Performance status: 1  Vitals:   12/12/18 1051  BP: 139/77  Pulse: 75  Resp: 18  Temp: 97.8 F (36.6 C)  SpO2: 100%   Filed Weights   12/12/18 1051  Weight: 157 lb 14.4 oz (71.6 kg)    Physical Exam Vitals signs reviewed.  Constitutional:      Appearance: Normal appearance.  Cardiovascular:     Rate and Rhythm: Normal rate and regular rhythm.     Heart sounds: Normal heart sounds.  Pulmonary:     Effort: Pulmonary effort is normal.     Breath sounds: Normal breath sounds.  Abdominal:     General: Bowel sounds are normal. There is no distension.     Palpations:  Abdomen is soft.  Musculoskeletal:        General: No swelling.  Skin:    General: Skin is warm.  Neurological:     General: No focal deficit present.     Mental Status: He is alert and oriented to person, place, and time.  Psychiatric:        Mood and Affect: Mood normal.        Behavior: Behavior normal.      LABORATORY DATA:  I have reviewed the labs as listed.  CBC    Component Value Date/Time   WBC 7.2 12/05/2018 1451   RBC 4.80 12/05/2018 1451   HGB 13.8 12/05/2018 1451   HCT 43.4 12/05/2018 1451   PLT 126 (L) 12/05/2018 1451   MCV 90.4 12/05/2018 1451   MCH 28.8 12/05/2018 1451   MCHC 31.8 12/05/2018 1451   RDW 14.3 12/05/2018 1451   LYMPHSABS 2.1 12/05/2018 1451   MONOABS 0.6 12/05/2018 1451   EOSABS 0.2 12/05/2018 1451   BASOSABS 0.0 12/05/2018 1451   CMP Latest Ref Rng & Units 12/05/2018 05/22/2018 11/09/2017  Glucose 70 - 99 mg/dL 125(H) 108(H) 105(H)  BUN 8 - 23 mg/dL 16 11 11   Creatinine 0.61 - 1.24 mg/dL 1.79(H) 1.56(H) 1.56(H)  Sodium 135 - 145 mmol/L 136 140 139  Potassium 3.5 - 5.1 mmol/L 4.1 4.4 4.1  Chloride 98 - 111 mmol/L 101 104 100  CO2 22 - 32 mmol/L 26 27 30   Calcium 8.9 - 10.3 mg/dL 9.5 9.4 9.7  Total Protein 6.5 - 8.1 g/dL 7.3 7.0 7.2  Total Bilirubin 0.3 - 1.2 mg/dL 0.6 0.7 1.1  Alkaline Phos 38 - 126 U/L 55 66 58  AST 15 - 41 U/L 27 24 27   ALT 0 - 44 U/L 21 23 21        ASSESSMENT & PLAN:   Thrombocytopenia (HCC) Mild to moderate thrombocytopenia: - He has history of thrombocytopenia since March 2015. - Ultrasound of the abdomen from 02/02/2017 shows normal-sized spleen with increased hepatic echotexture compatible with fatty infiltrative change. -Nutritional deficiency and infectious causes were ruled out. -Etiology includes immune thrombocytopenia versus MDS. -Denies any major bleeding issues.  Denies any B symptoms.  No infections or hospitalizations. - He is on Brilinta for CAD for last several years.  Denies any bleeding  complications. -We reviewed his labs.  Platelet count is 126 today.  Hemoglobin and white cells are normal. -As his platelet count has been staying stable, I have recommended follow-up visit in 1 year with labs.   CKD: -His creatinine ranges between 1.5-1.7.     Orders placed this encounter:  Orders Placed This Encounter  Procedures  . CBC with Differential  . Lactate dehydrogenase      Derek Jack, MD [place] [phone]

## 2018-12-17 DIAGNOSIS — I2510 Atherosclerotic heart disease of native coronary artery without angina pectoris: Secondary | ICD-10-CM | POA: Diagnosis not present

## 2018-12-17 DIAGNOSIS — E7800 Pure hypercholesterolemia, unspecified: Secondary | ICD-10-CM | POA: Diagnosis not present

## 2018-12-17 DIAGNOSIS — I10 Essential (primary) hypertension: Secondary | ICD-10-CM | POA: Diagnosis not present

## 2018-12-22 ENCOUNTER — Encounter (HOSPITAL_COMMUNITY): Payer: Self-pay | Admitting: Hematology

## 2018-12-22 NOTE — Assessment & Plan Note (Signed)
Mild to moderate thrombocytopenia: - He has history of thrombocytopenia since March 2015. - Ultrasound of the abdomen from 02/02/2017 shows normal-sized spleen with increased hepatic echotexture compatible with fatty infiltrative change. -Nutritional deficiency and infectious causes were ruled out. -Etiology includes immune thrombocytopenia versus MDS. -Denies any major bleeding issues.  Denies any B symptoms.  No infections or hospitalizations. - He is on Brilinta for CAD for last several years.  Denies any bleeding complications. -We reviewed his labs.  Platelet count is 126 today.  Hemoglobin and white cells are normal. -As his platelet count has been staying stable, I have recommended follow-up visit in 1 year with labs.   CKD: -His creatinine ranges between 1.5-1.7.

## 2018-12-31 NOTE — Progress Notes (Signed)
Virtual Visit via Telephone Note   This visit type was conducted due to national recommendations for restrictions regarding the COVID-19 Pandemic (e.g. social distancing) in an effort to limit this patient's exposure and mitigate transmission in our community.  Due to his co-morbid illnesses, this patient is at least at moderate risk for complications without adequate follow up.  This format is felt to be most appropriate for this patient at this time.  The patient did not have access to video technology/had technical difficulties with video requiring transitioning to audio format only (telephone).  All issues noted in this document were discussed and addressed.  No physical exam could be performed with this format.  Please refer to the patient's chart for his  consent to telehealth for [facility].   Date:  01/01/2019   ID:  Keith Dancer., DOB 1945-09-08, MRN [id number]  Patient Location: Home Provider Location: Office  PCP:  [name], MD  Cardiologist:  Rozann Lesches, MD Electrophysiologist:  None   Evaluation Performed:  Follow-Up Visit  Chief Complaint:   Cardiac follow-up  History of Present Illness:    Keith Kleve. is a 73 y.o. male last assessed via telehealth encounter in May.  We spoke by phone today.  He tells me that he has been feeling well.  Walking for exercise and also playing golf.  He does not report any angina symptoms or nitroglycerin use, stable NYHA class II dyspnea.  He tells me that he got a Covid test just to be safe since he was playing golf with friends, not having any symptoms, this was negative.  He wears a mask when he goes out typically.  I reviewed his medications which are stable from a cardiac perspective and outlined below.  He continues to follow with Dr. Manuella Ghazi.  I reviewed his recent lab work from October.  He does have CKD stage IIIb.   Past Medical History:  Diagnosis Date  . Arthritis   . CAD (coronary artery disease)    a.  05/19/13: DES x 2 to RCA, staged DES to OM 05/22/13, residual LAD/diag disease for Rx mgmt  b. 05/20/14 [place] s/p DES to LAD  . CKD (chronic kidney disease), stage III   . Essential hypertension   . GERD (gastroesophageal reflux disease)   . Thrombocytopenia ([place])    Past Surgical History:  Procedure Laterality Date  . Bilateral hip replacements    . CARDIAC CATHETERIZATION    . COLONOSCOPY W/ POLYPECTOMY  05/2016   [facility](>3[facility]  . CORONARY ANGIOPLASTY WITH STENT PLACEMENT  05/20/2014   mid lad   . CORONARY STENT PLACEMENT  05/19/13  . FRACTIONAL FLOW RESERVE WIRE Right 05/22/2013   Procedure: FRACTIONAL FLOW RESERVE WIRE;  Surgeon: Burnell Blanks, MD;  Location: [facility] CATH LAB;  Service: Cardiovascular;  Laterality: Right;  . LEFT HEART CATHETERIZATION WITH CORONARY ANGIOGRAM N/A 05/19/2013   Procedure: LEFT HEART CATHETERIZATION WITH CORONARY ANGIOGRAM;  Surgeon: Troy Sine, MD;  Location: [facility] CATH LAB;  Service: Cardiovascular;  Laterality: N/A;  . LEFT HEART CATHETERIZATION WITH CORONARY ANGIOGRAM N/A 05/20/2014   Procedure: LEFT HEART CATHETERIZATION WITH CORONARY ANGIOGRAM;  Surgeon: Burnell Blanks, MD;  Location: [facility] CATH LAB;  Service: Cardiovascular;  Laterality: N/A;  . PERCUTANEOUS CORONARY STENT INTERVENTION (PCI-S) N/A 05/22/2013   Procedure: PERCUTANEOUS CORONARY STENT INTERVENTION (PCI-S);  Surgeon: Burnell Blanks, MD;  Location: [facility] CATH LAB;  Service: Cardiovascular;  Laterality: N/A;  Current Meds  Medication Sig  . aspirin EC 81 MG tablet Take 81 mg by mouth daily.  Marland Kitchen atorvastatin (LIPITOR) 40 MG tablet Take 1 tablet (40 mg total) by mouth every evening.  Marland Kitchen BRILINTA 90 MG TABS tablet TAKE 1 TABLET TWICE A DAY (NEED APPOINTMENT FOR REFILLS, OVERDUE FOR FOLLOW UP, WAS DUE THIS FEBRUARY)  . Cholecalciferol (VITAMIN D3) 25 MCG (1000 UT) CAPS Take 1 capsule by mouth daily.  Marland Kitchen lisinopril-hydrochlorothiazide (PRINZIDE,ZESTORETIC) 20-12.5 MG  tablet TAKE 1 TABLET DAILY  . metoprolol tartrate (LOPRESSOR) 25 MG tablet Take 25 mg by mouth 2 (two) times daily.  . nitroGLYCERIN (NITROSTAT) 0.4 MG SL tablet DISSOLVE 1 TABLET UNDER THE TONGUE EVERY 5 MINUTES AS NEEDED FOR CHEST PAIN (UP TO 3 DOSES)  . omeprazole (PRILOSEC) 40 MG capsule Take 1 capsule (40 mg total) by mouth at bedtime.  . promethazine (PHENERGAN) 25 MG tablet Take 25 mg by mouth as needed.   . vitamin B-12 (CYANOCOBALAMIN) 1000 MCG tablet Take 1,000 mcg by mouth daily.     Allergies:   Patient has no known allergies.   Social History   Tobacco Use  . Smoking status: Former Smoker    Packs/day: 1.00    Years: 38.00    Pack years: 38.00    Types: Cigarettes    Start date: 10/16/1965    Quit date: 02/21/2002    Years since quitting: 16.8  . Smokeless tobacco: Never Used  Substance Use Topics  . Alcohol use: Yes    Comment: occ beer  . Drug use: No     Family Hx: The patient's family history includes Aneurysm in his mother; Colon polyps in his sister; Diabetes in his sister and sister; Heart disease in his sister. There is no history of Colon cancer.  ROS:   Please see the history of present illness. All other systems reviewed and are negative.   Prior CV studies:   The following studies were reviewed today:  Echocardiogram 05/20/2014: Study Conclusions  - Left ventricle: The cavity size was normal. Wall thickness was increased in a pattern of moderate LVH. Systolic function was normal. The estimated ejection fraction was in the range of 60% to 65%. Wall motion was normal; there were no regional wall motion abnormalities. Doppler parameters are consistent with abnormal left ventricular relaxation (grade 1 diastolic dysfunction). The E/e&' ratio is between 8-15, suggesting indeterminate LV filling pressure. - Aortic valve: Sclerosis without stenosis. There was no regurgitation. - Mitral valve: There was no significant regurgitation. -  Left atrium: The atrium was normal in size. - Right ventricle: The cavity size was normal. Wall thickness was normal. Systolic function is reduced. - Right atrium: The atrium was normal in size.  Impressions:  - LVEF 60-65%, moderate LVH, diastolic dysfunction, indeterminate (but likely elevated LV Filling pressure), normal LA size, mildly reduced RV function by TAPSE, no significant TR  Lexiscan Myoview 02/26/2018:  There was no ST segment deviation noted during stress.  Findings consistent with inferior/inferoseptal/apical prior myocardial infarction. There is no current myocardium at jeopardy.  The left ventricular ejection fraction is hyperdynamic (>65%).  This is a low risk study.  Labs/Other Tests and Data Reviewed:    EKG:  An ECG dated 07/14/2017 was personally reviewed today and demonstrated:  Sinus rhythm with borderline low voltage.  Recent Labs: 12/05/2018: ALT 21; BUN 16; Creatinine, Ser 1.79; Hemoglobin 13.8; Platelets 126; Potassium 4.1; Sodium 136    Wt Readings from Last 3 Encounters:  01/01/19 155 lb (  70.3 kg)  12/12/18 157 lb 14.4 oz (71.6 kg)  07/04/18 155 lb (70.3 kg)     Objective:    Vital Signs:  BP 128/71   Pulse 60   Temp (!) 97 F (36.1 C)   Ht 5\' 5"  (1.651 m)   Wt 155 lb (70.3 kg)   BMI 25.79 kg/m    Patient spoke in full sentences, not short of breath. No audible wheezing or coughing. Speech pattern normal.  ASSESSMENT & PLAN:    1.  CAD status post DES x2 to the RCA and staged DES to the OM in 2015 followed by DES to the LAD in 2016.  He is stable at this time without active angina and we will continue medical therapy and observation.  He did undergo a low risk Myoview in January.  2.  Mixed hyperlipidemia, he continues on moderate dose Lipitor with follow-up lipids per the [facility].  3.  CKD stage IIIb, recent creatinine 1.79, potassium normal.  4.  Essential hypertension, systolic is in the 120s today.  No changes  to current medication.  COVID-19 Education: The signs and symptoms of COVID-19 were discussed with the patient and how to seek care for testing (follow up with PCP or arrange E-visit).  The importance of social distancing was discussed today.  Time:   Today, I have spent 7 minutes with the patient with telehealth technology discussing the above problems.     Medication Adjustments/Labs and Tests Ordered: Current medicines are reviewed at length with the patient today.  Concerns regarding medicines are outlined above.   Tests Ordered: No orders of the defined types were placed in this encounter.   Medication Changes: No orders of the defined types were placed in this encounter.   Follow Up:  Either In Person or Virtual 6 months.  Signed, Nona Dell, MD  01/01/2019 10:26 AM    [facility]

## 2019-01-01 ENCOUNTER — Telehealth (INDEPENDENT_AMBULATORY_CARE_PROVIDER_SITE_OTHER): Payer: Medicare Other | Admitting: Cardiology

## 2019-01-01 ENCOUNTER — Encounter: Payer: Self-pay | Admitting: Cardiology

## 2019-01-01 VITALS — BP 128/71 | HR 60 | Temp 97.0°F | Ht 65.0 in | Wt 155.0 lb

## 2019-01-01 DIAGNOSIS — I10 Essential (primary) hypertension: Secondary | ICD-10-CM

## 2019-01-01 DIAGNOSIS — I25119 Atherosclerotic heart disease of native coronary artery with unspecified angina pectoris: Secondary | ICD-10-CM | POA: Diagnosis not present

## 2019-01-01 DIAGNOSIS — N1832 Chronic kidney disease, stage 3b: Secondary | ICD-10-CM | POA: Diagnosis not present

## 2019-01-01 DIAGNOSIS — E782 Mixed hyperlipidemia: Secondary | ICD-10-CM

## 2019-01-01 NOTE — Patient Instructions (Addendum)

## 2019-02-11 IMAGING — DX DG CHEST 2V
2 series · 2 of 2 positions shown · non-contrast
Comparison: 12/01/2016

CLINICAL DATA: Chest pain

EXAM:
CHEST - 2 VIEW

[chest pa]
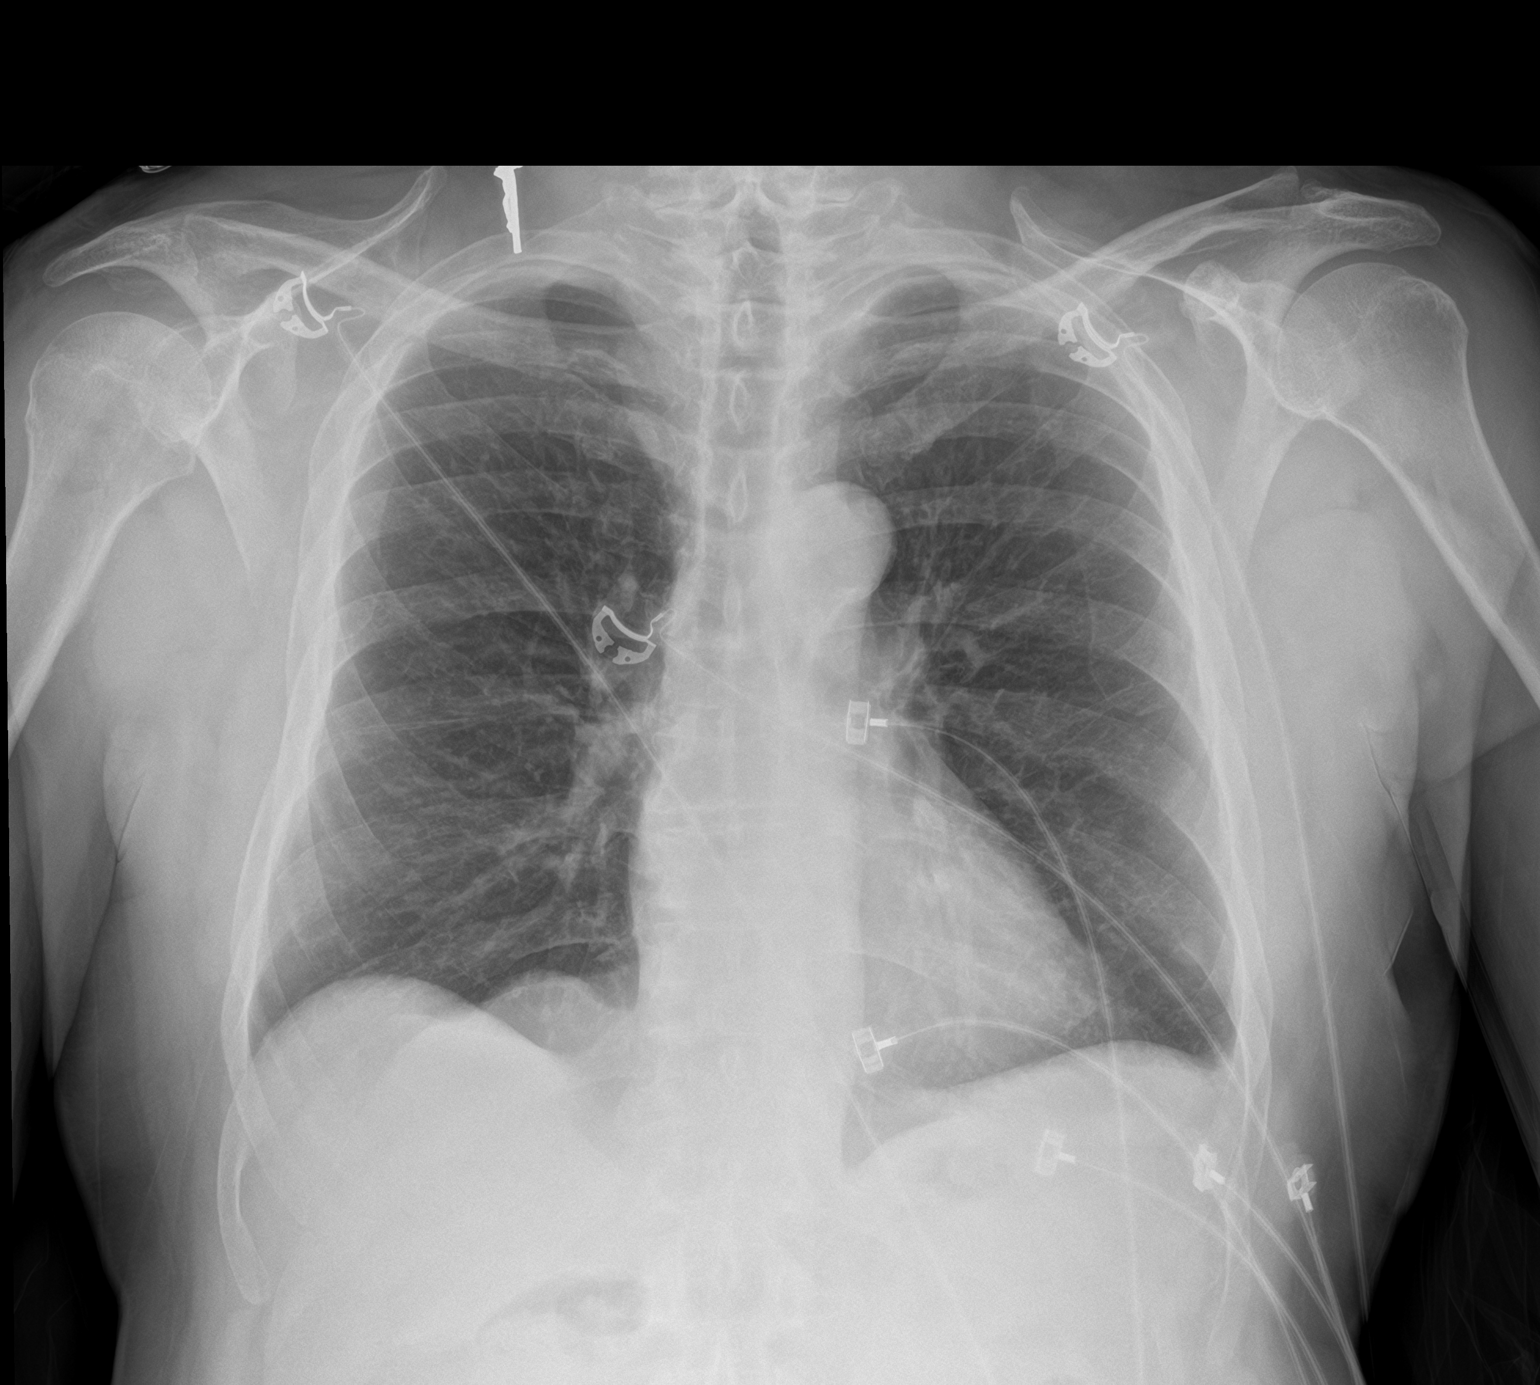

[chest lat]
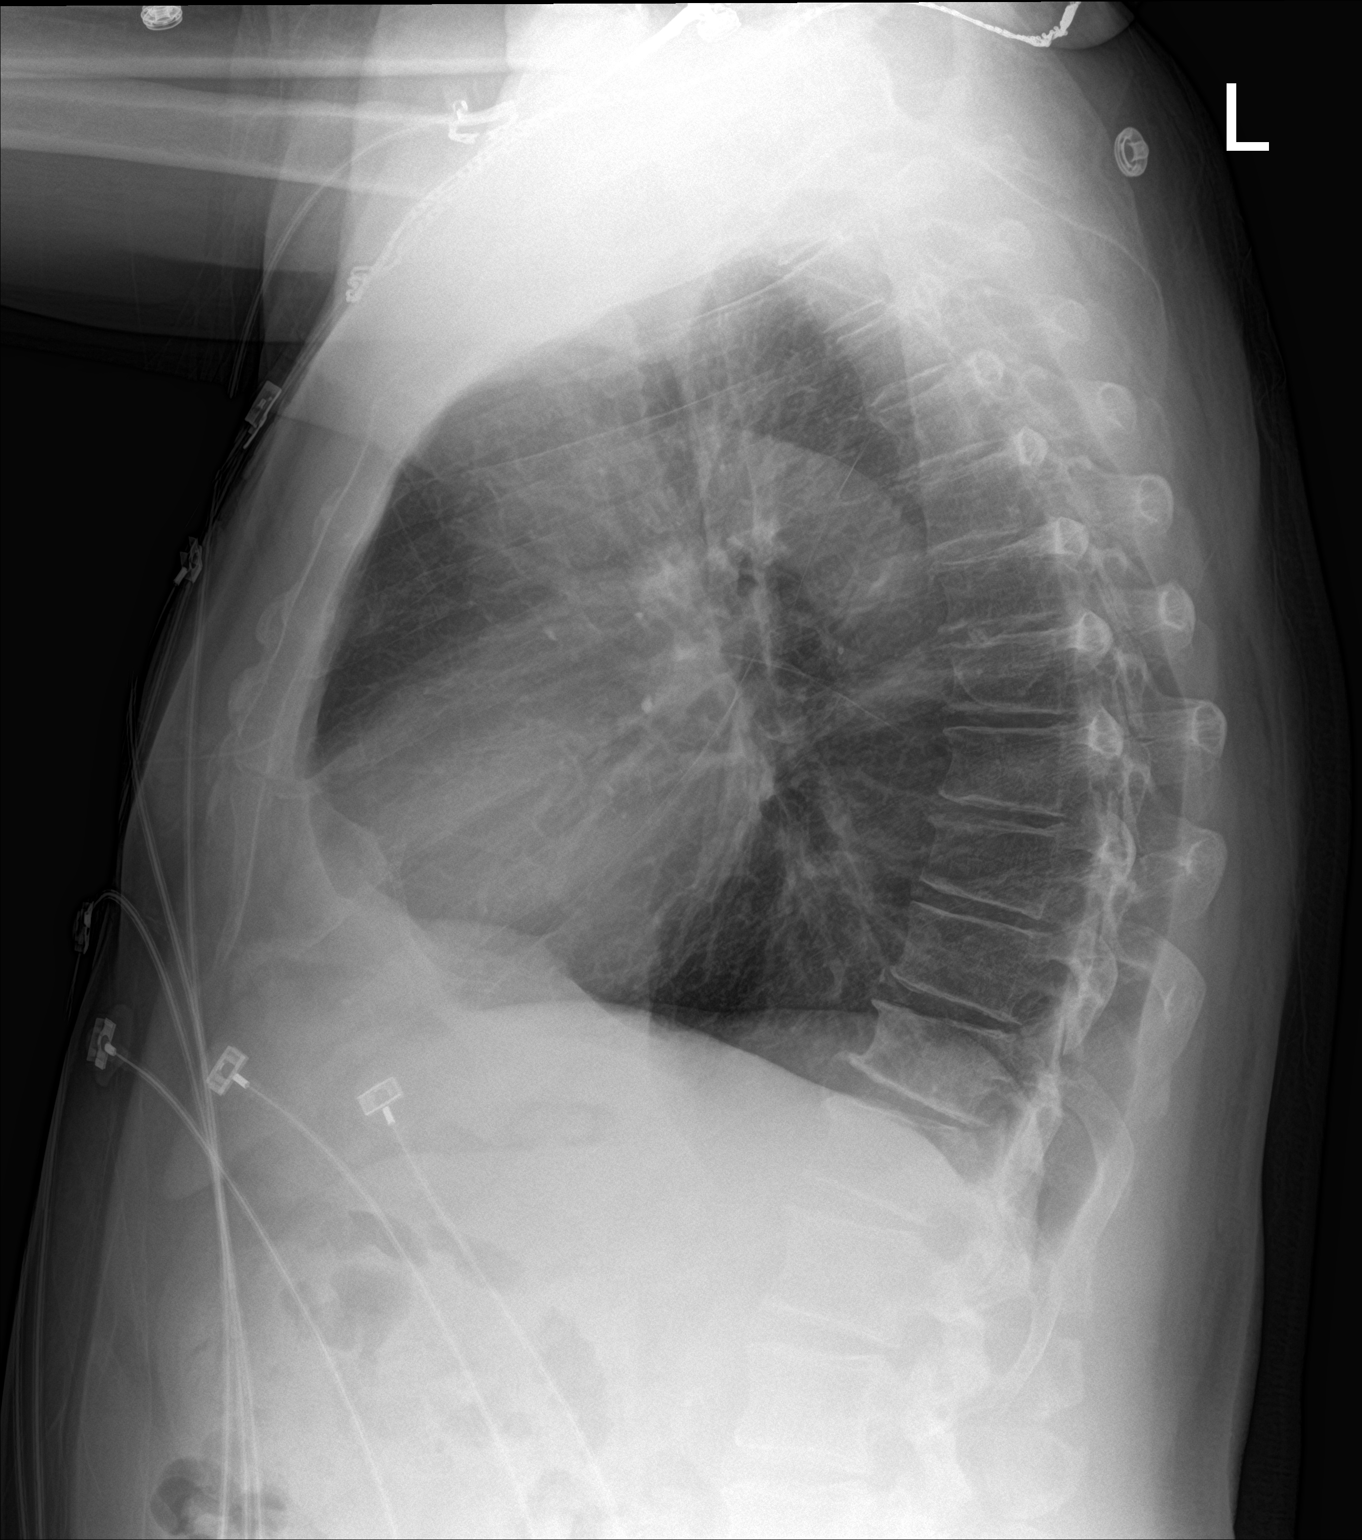

[2 of 2 positions shown; findings below may reference images not displayed]

FINDINGS: The heart size and mediastinal contours are within normal limits.
Both lungs are clear. Chronic wedging of lower thoracic vertebra.
Probable nipple shadow left lower lung.
IMPRESSION: No active cardiopulmonary disease. Probable left lower lung nipple
shadow, short interval follow-up radiograph with nipple markers
suggested.

## 2019-02-26 DIAGNOSIS — I10 Essential (primary) hypertension: Secondary | ICD-10-CM | POA: Diagnosis not present

## 2019-02-26 DIAGNOSIS — I2510 Atherosclerotic heart disease of native coronary artery without angina pectoris: Secondary | ICD-10-CM | POA: Diagnosis not present

## 2019-02-26 DIAGNOSIS — E7800 Pure hypercholesterolemia, unspecified: Secondary | ICD-10-CM | POA: Diagnosis not present

## 2019-03-18 ENCOUNTER — Other Ambulatory Visit: Payer: Self-pay | Admitting: Cardiology

## 2019-04-01 ENCOUNTER — Telehealth: Payer: Self-pay | Admitting: *Deleted

## 2019-04-01 ENCOUNTER — Encounter: Payer: Self-pay | Admitting: Gastroenterology

## 2019-04-01 NOTE — Telephone Encounter (Signed)
Reports intermittent chest pain rated 5/10, that started yesterday afternoon. Reported some sob with chest pain yesterday x's 1 that only lasted a few minutes. Has not used nitroglycerin. Denies dizziness. Denies, cough, congestion, fever, headache, body aches, or sudden loss of taste or smell. Appointment given to patient per his request to see Dr. Diona Browner tomorrow @8 :40 am [place] office. Advised if symptoms get worse, to go to the ED for an evaluation. Verbalized understanding of plan.

## 2019-04-02 ENCOUNTER — Ambulatory Visit (INDEPENDENT_AMBULATORY_CARE_PROVIDER_SITE_OTHER): Payer: Medicare Other | Admitting: Cardiology

## 2019-04-02 ENCOUNTER — Encounter: Payer: Self-pay | Admitting: Cardiology

## 2019-04-02 VITALS — BP 135/78 | HR 71 | Temp 98.5°F | Ht 65.0 in | Wt 155.0 lb

## 2019-04-02 DIAGNOSIS — I10 Essential (primary) hypertension: Secondary | ICD-10-CM | POA: Diagnosis not present

## 2019-04-02 DIAGNOSIS — I25119 Atherosclerotic heart disease of native coronary artery with unspecified angina pectoris: Secondary | ICD-10-CM | POA: Diagnosis not present

## 2019-04-02 DIAGNOSIS — E782 Mixed hyperlipidemia: Secondary | ICD-10-CM | POA: Diagnosis not present

## 2019-04-02 DIAGNOSIS — N1832 Chronic kidney disease, stage 3b: Secondary | ICD-10-CM

## 2019-04-02 NOTE — Progress Notes (Signed)
Cardiology Office Note  Date: 04/02/2019   ID: Keith Pusey., DOB [birth year], MRN [id number]  PCP:  Kirstie Peri, MD  Cardiologist:  Nona Dell, MD Electrophysiologist:  None   Chief Complaint  Patient presents with  . Chest discomfort    History of Present Illness: Keith Keith. is a 74 y.o. male last assessed via telehealth encounter in November 2020.  He called to schedule a follow-up visit for discussion of chest discomfort.  He states that he got the first dose of the coronavirus vaccine on Sunday through the [place] in [place].  He went out to the golf course to hit some balls thereafter and noticed a relatively mild discomfort in his left upper chest, not necessarily shoulder.  This lasted for about 10 minutes and did not limit his activity.  He felt somewhat sluggish when walking to get the balls but did not report typical angina or progressive shortness of breath.  Since then he has had no further symptoms.  He has not used any nitroglycerin.  I personally reviewed his ECG today which shows normal sinus rhythm with sinus arrhythmia.  Cardiac regimen is stable and includes aspirin, Brilinta, Prinzide, Lipitor, Lopressor, and as needed nitroglycerin.  He had a follow-up stress test in January 2020, low risk Lexiscan Myoview showing evidence of inferior/inferoseptal/apical scar without ischemia.  Past Medical History:  Diagnosis Date  . Arthritis   . CAD (coronary artery disease)    a. 05/19/13: DES x 2 to RCA, staged DES to OM 05/22/13, residual LAD/diag disease for Rx mgmt  b. 05/20/14 [place] s/p DES to LAD  . CKD (chronic kidney disease), stage III   . Essential hypertension   . GERD (gastroesophageal reflux disease)   . Thrombocytopenia (HCC)     Past Surgical History:  Procedure Laterality Date  . Bilateral hip replacements    . CARDIAC CATHETERIZATION    . COLONOSCOPY W/ POLYPECTOMY  05/2016   COLON POLYPS REMOVED(>3) [facility]  . CORONARY ANGIOPLASTY WITH  STENT PLACEMENT  05/20/2014   mid lad   . CORONARY STENT PLACEMENT  05/19/13  . FRACTIONAL FLOW RESERVE WIRE Right 05/22/2013   Procedure: FRACTIONAL FLOW RESERVE WIRE;  Surgeon: Kathleene Hazel, MD;  Location: [facility] CATH LAB;  Service: Cardiovascular;  Laterality: Right;  . LEFT HEART CATHETERIZATION WITH CORONARY ANGIOGRAM N/A 05/19/2013   Procedure: LEFT HEART CATHETERIZATION WITH CORONARY ANGIOGRAM;  Surgeon: Lennette Bihari, MD;  Location: [facility] CATH LAB;  Service: Cardiovascular;  Laterality: N/A;  . LEFT HEART CATHETERIZATION WITH CORONARY ANGIOGRAM N/A 05/20/2014   Procedure: LEFT HEART CATHETERIZATION WITH CORONARY ANGIOGRAM;  Surgeon: Kathleene Hazel, MD;  Location: [facility] CATH LAB;  Service: Cardiovascular;  Laterality: N/A;  . PERCUTANEOUS CORONARY STENT INTERVENTION (PCI-S) N/A 05/22/2013   Procedure: PERCUTANEOUS CORONARY STENT INTERVENTION (PCI-S);  Surgeon: Kathleene Hazel, MD;  Location: [facility] CATH LAB;  Service: Cardiovascular;  Laterality: N/A;    Current Outpatient Medications  Medication Sig Dispense Refill  . aspirin EC 81 MG tablet Take 81 mg by mouth daily.    Marland Kitchen atorvastatin (LIPITOR) 40 MG tablet Take 1 tablet (40 mg total) by mouth every evening. 30 tablet 6  . BRILINTA 90 MG TABS tablet TAKE 1 TABLET TWICE A DAY (NEED APPOINTMENT FOR REFILLS, OVERDUE FOR FOLLOW UP, WAS DUE THIS FEBRUARY) 180 tablet 3  . Cholecalciferol (VITAMIN D3) 25 MCG (1000 UT) CAPS Take 1 capsule by mouth daily.    Marland Kitchen lisinopril-hydrochlorothiazide (PRINZIDE,ZESTORETIC) 20-12.5 MG  tablet TAKE 1 TABLET DAILY 90 tablet 1  . metoprolol tartrate (LOPRESSOR) 25 MG tablet Take 25 mg by mouth 2 (two) times daily.    . nitroGLYCERIN (NITROSTAT) 0.4 MG SL tablet DISSOLVE 1 TABLET UNDER THE TONGUE EVERY 5 MINUTES AS NEEDED FOR CHEST PAIN UP TO 3 DOSES 25 tablet 11  . omeprazole (PRILOSEC) 40 MG capsule Take 1 capsule (40 mg total) by mouth at bedtime. 30 capsule 6  . promethazine (PHENERGAN) 25 MG tablet  Take 25 mg by mouth as needed.     . vitamin B-12 (CYANOCOBALAMIN) 1000 MCG tablet Take 1,000 mcg by mouth daily.     No current facility-administered medications for this visit.   Allergies:  Patient has no known allergies.   ROS: No reported bleeding episodes or changes in stool.  Physical Exam: VS:  BP 135/78   Pulse 71   Temp 98.5 F (36.9 C)   Ht 5\' 5"  (1.651 m)   Wt 155 lb (70.3 kg)   BMI 25.79 kg/m , BMI Body mass index is 25.79 kg/m.  Wt Readings from Last 3 Encounters:  04/02/19 155 lb (70.3 kg)  01/01/19 155 lb (70.3 kg)  12/12/18 157 lb 14.4 oz (71.6 kg)    General: Patient appears comfortable at rest. HEENT: Conjunctiva and lids normal, wearing a mask. Neck: Supple, no elevated JVP or carotid bruits, no thyromegaly. Lungs: Clear to auscultation, nonlabored breathing at rest. Cardiac: Regular rate and rhythm, no S3 or significant systolic murmur, no pericardial rub. Extremities: No pitting edema, distal pulses 2+.  ECG:  An ECG dated 07/14/2017 was personally reviewed today and demonstrated:  Sinus rhythm with borderline low voltage.  Recent Labwork: 12/05/2018: ALT 21; AST 27; BUN 16; Creatinine, Ser 1.79; Hemoglobin 13.8; Platelets 126; Potassium 4.1; Sodium 136   Other Studies Reviewed Today:  Echocardiogram 05/20/2014: Study Conclusions  - Left ventricle: The cavity size was normal. Wall thickness was increased in a pattern of moderate LVH. Systolic function was normal. The estimated ejection fraction was in the range of 60% to 65%. Wall motion was normal; there were no regional wall motion abnormalities. Doppler parameters are consistent with abnormal left ventricular relaxation (grade 1 diastolic dysfunction). The E/e&' ratio is between 8-15, suggesting indeterminate LV filling pressure. - Aortic valve: Sclerosis without stenosis. There was no regurgitation. - Mitral valve: There was no significant regurgitation. - Left atrium: The  atrium was normal in size. - Right ventricle: The cavity size was normal. Wall thickness was normal. Systolic function is reduced. - Right atrium: The atrium was normal in size.  Impressions:  - LVEF 60-65%, moderate LVH, diastolic dysfunction, indeterminate (but likely elevated LV Filling pressure), normal LA size, mildly reduced RV function by TAPSE, no significant TR  Lexiscan Myoview 02/26/2018:  There was no ST segment deviation noted during stress.  Findings consistent with inferior/inferoseptal/apical prior myocardial infarction. There is no current myocardium at jeopardy.  The left ventricular ejection fraction is hyperdynamic (>65%).  This is a low risk study.  Assessment and Plan:  1.  Recent episode of limited, atypical chest discomfort.  Doubt angina at this time.  ECG reviewed and stable.  Continue observation for change in pattern that might warrant further evaluation.  He underwent follow-up ischemic testing last year which was low risk.  2.  CAD status post DES x2 to the RCA and staged DES to the OM in 2015 followed by DES to the LAD in 2016.  Plan is to continue observation on medical  therapy which includes aspirin, Brilinta, Lipitor, Prinzide, Lopressor, and as needed nitroglycerin.  3.  Essential hypertension, systolic pressure in the 130s today.  No changes made to current regimen.  Medication Adjustments/Labs and Tests Ordered: Current medicines are reviewed at length with the patient today.  Concerns regarding medicines are outlined above.   Tests Ordered: No orders of the defined types were placed in this encounter.   Medication Changes: No orders of the defined types were placed in this encounter.   Disposition:  Follow up 6 months in the [place] office.  Signed, Jonelle Sidle, MD, [facility] 04/02/2019 8:59 AM    [facility] at [facility] [address], [place], [place] [address] Phone: [phone]; Fax: [phone]

## 2019-04-02 NOTE — Patient Instructions (Signed)
Medication Instructions:  Your physician recommends that you continue on your current medications as directed. Please refer to the Current Medication list given to you today.  *If you need a refill on your cardiac medications before your next appointment, please call your pharmacy*  Lab Work: NONE  If you have labs (blood work) drawn today and your tests are completely normal, you will receive your results only by: . MyChart Message (if you have MyChart) OR . A paper copy in the mail If you have any lab test that is abnormal or we need to change your treatment, we will call you to review the results.  Testing/Procedures: NONE   Follow-Up: At [facility], you and your health needs are our priority.  As part of our continuing mission to provide you with exceptional heart care, we have created designated Provider Care Teams.  These Care Teams include your primary Cardiologist (physician) and Advanced Practice Providers (APPs -  Physician Assistants and Nurse Practitioners) who all work together to provide you with the care you need, when you need it.  Your next appointment:   6 month(s)  The format for your next appointment:   In Person  Provider:   Samuel McDowell, MD  Other Instructions Thank you for choosing [facility]!    

## 2019-04-03 ENCOUNTER — Other Ambulatory Visit (INDEPENDENT_AMBULATORY_CARE_PROVIDER_SITE_OTHER): Payer: Medicare Other

## 2019-04-03 DIAGNOSIS — I10 Essential (primary) hypertension: Secondary | ICD-10-CM

## 2019-05-14 DIAGNOSIS — K219 Gastro-esophageal reflux disease without esophagitis: Secondary | ICD-10-CM | POA: Diagnosis not present

## 2019-05-14 DIAGNOSIS — Z6827 Body mass index (BMI) 27.0-27.9, adult: Secondary | ICD-10-CM | POA: Diagnosis not present

## 2019-05-14 DIAGNOSIS — Z87891 Personal history of nicotine dependence: Secondary | ICD-10-CM | POA: Diagnosis not present

## 2019-05-14 DIAGNOSIS — Z299 Encounter for prophylactic measures, unspecified: Secondary | ICD-10-CM | POA: Diagnosis not present

## 2019-05-14 DIAGNOSIS — I10 Essential (primary) hypertension: Secondary | ICD-10-CM | POA: Diagnosis not present

## 2019-05-16 DIAGNOSIS — I10 Essential (primary) hypertension: Secondary | ICD-10-CM | POA: Diagnosis not present

## 2019-05-16 DIAGNOSIS — E7800 Pure hypercholesterolemia, unspecified: Secondary | ICD-10-CM | POA: Diagnosis not present

## 2019-05-16 DIAGNOSIS — I2510 Atherosclerotic heart disease of native coronary artery without angina pectoris: Secondary | ICD-10-CM | POA: Diagnosis not present

## 2019-06-29 ENCOUNTER — Other Ambulatory Visit: Payer: Self-pay | Admitting: Cardiology

## 2019-07-21 DIAGNOSIS — I2510 Atherosclerotic heart disease of native coronary artery without angina pectoris: Secondary | ICD-10-CM | POA: Diagnosis not present

## 2019-07-21 DIAGNOSIS — I10 Essential (primary) hypertension: Secondary | ICD-10-CM | POA: Diagnosis not present

## 2019-07-21 DIAGNOSIS — E7800 Pure hypercholesterolemia, unspecified: Secondary | ICD-10-CM | POA: Diagnosis not present

## 2019-09-20 DIAGNOSIS — I2510 Atherosclerotic heart disease of native coronary artery without angina pectoris: Secondary | ICD-10-CM | POA: Diagnosis not present

## 2019-09-20 DIAGNOSIS — I10 Essential (primary) hypertension: Secondary | ICD-10-CM | POA: Diagnosis not present

## 2019-09-20 DIAGNOSIS — E7800 Pure hypercholesterolemia, unspecified: Secondary | ICD-10-CM | POA: Diagnosis not present

## 2019-10-10 DIAGNOSIS — I10 Essential (primary) hypertension: Secondary | ICD-10-CM | POA: Diagnosis not present

## 2019-10-10 DIAGNOSIS — I2510 Atherosclerotic heart disease of native coronary artery without angina pectoris: Secondary | ICD-10-CM | POA: Diagnosis not present

## 2019-10-10 DIAGNOSIS — E7800 Pure hypercholesterolemia, unspecified: Secondary | ICD-10-CM | POA: Diagnosis not present

## 2019-10-17 ENCOUNTER — Encounter: Payer: Self-pay | Admitting: Cardiology

## 2019-10-17 ENCOUNTER — Telehealth: Payer: Self-pay | Admitting: Cardiology

## 2019-10-17 ENCOUNTER — Other Ambulatory Visit: Payer: Self-pay

## 2019-10-17 ENCOUNTER — Ambulatory Visit (INDEPENDENT_AMBULATORY_CARE_PROVIDER_SITE_OTHER): Payer: Medicare Other | Admitting: Cardiology

## 2019-10-17 VITALS — BP 110/64 | HR 81 | Ht 65.0 in | Wt 154.6 lb

## 2019-10-17 DIAGNOSIS — I25119 Atherosclerotic heart disease of native coronary artery with unspecified angina pectoris: Secondary | ICD-10-CM

## 2019-10-17 DIAGNOSIS — I10 Essential (primary) hypertension: Secondary | ICD-10-CM

## 2019-10-17 DIAGNOSIS — E782 Mixed hyperlipidemia: Secondary | ICD-10-CM

## 2019-10-17 NOTE — Patient Instructions (Addendum)

## 2019-10-17 NOTE — Telephone Encounter (Signed)
Patient had requested to ask Dr. Diona Browner if he could recommend something to help him sleep at night.

## 2019-10-17 NOTE — Progress Notes (Signed)
Cardiology Office Note  Date: 10/17/2019   ID: Evo Aderman., DOB 1945/08/17, MRN [id number]  PCP:  Kirstie Peri, MD  Cardiologist:  Nona Dell, MD Electrophysiologist:  None   Chief Complaint  Patient presents with  . Cardiac follow-up    History of Present Illness: Keith Keith. is a 74 y.o. male last seen in February.  He presents for a routine visit.  Today is his 74rd birthday.  He states that he has been doing well overall.  Still enjoys golf, he plays regularly.  Has had trouble with the high heat and humidity.  No definite angina symptoms however or nitroglycerin use.  He reports occasional "quenches" in his chest that are very brief and atypical for a cardiac etiology.  I reviewed his medications.  He reports compliance, no intolerances.  He will have lab work with Dr. Sherryll Burger next month.  Blood pressure is well controlled today.  Past Medical History:  Diagnosis Date  . Arthritis   . CAD (coronary artery disease)    a. 05/19/13: DES x 2 to RCA, staged DES to OM 05/22/13, residual LAD/diag disease for Rx mgmt  b. 05/20/14 [place] s/p DES to LAD  . CKD (chronic kidney disease), stage III   . Essential hypertension   . GERD (gastroesophageal reflux disease)   . Thrombocytopenia (HCC)     Past Surgical History:  Procedure Laterality Date  . Bilateral hip replacements    . CARDIAC CATHETERIZATION    . COLONOSCOPY W/ POLYPECTOMY  05/2016   COLON POLYPS REMOVED(>3) [facility]  . CORONARY ANGIOPLASTY WITH STENT PLACEMENT  05/20/2014   mid lad   . CORONARY STENT PLACEMENT  05/19/13  . FRACTIONAL FLOW RESERVE WIRE Right 05/22/2013   Procedure: FRACTIONAL FLOW RESERVE WIRE;  Surgeon: Kathleene Hazel, MD;  Location: [facility] CATH LAB;  Service: Cardiovascular;  Laterality: Right;  . LEFT HEART CATHETERIZATION WITH CORONARY ANGIOGRAM N/A 05/19/2013   Procedure: LEFT HEART CATHETERIZATION WITH CORONARY ANGIOGRAM;  Surgeon: Lennette Bihari, MD;  Location: [facility] CATH LAB;   Service: Cardiovascular;  Laterality: N/A;  . LEFT HEART CATHETERIZATION WITH CORONARY ANGIOGRAM N/A 05/20/2014   Procedure: LEFT HEART CATHETERIZATION WITH CORONARY ANGIOGRAM;  Surgeon: Kathleene Hazel, MD;  Location: [facility] CATH LAB;  Service: Cardiovascular;  Laterality: N/A;  . PERCUTANEOUS CORONARY STENT INTERVENTION (PCI-S) N/A 05/22/2013   Procedure: PERCUTANEOUS CORONARY STENT INTERVENTION (PCI-S);  Surgeon: Kathleene Hazel, MD;  Location: [facility] CATH LAB;  Service: Cardiovascular;  Laterality: N/A;    Current Outpatient Medications  Medication Sig Dispense Refill  . aspirin EC 81 MG tablet Take 81 mg by mouth daily.    Marland Kitchen atorvastatin (LIPITOR) 40 MG tablet Take 1 tablet (40 mg total) by mouth every evening. 30 tablet 6  . BRILINTA 90 MG TABS tablet TAKE 1 TABLET TWICE A DAY (NEED APPOINTMENT FOR REFILLS, OVERDUE FOR FOLLOW UP, WAS DUE THIS FEBRUARY) 180 tablet 3  . Cholecalciferol (VITAMIN D3) 25 MCG (1000 UT) CAPS Take 1 capsule by mouth daily.    Marland Kitchen lisinopril-hydrochlorothiazide (PRINZIDE,ZESTORETIC) 20-12.5 MG tablet TAKE 1 TABLET DAILY 90 tablet 1  . metoprolol tartrate (LOPRESSOR) 25 MG tablet Take 25 mg by mouth 2 (two) times daily.    . nitroGLYCERIN (NITROSTAT) 0.4 MG SL tablet DISSOLVE 1 TABLET UNDER THE TONGUE EVERY 5 MINUTES AS NEEDED FOR CHEST PAIN UP TO 3 DOSES 25 tablet 11  . omeprazole (PRILOSEC) 40 MG capsule Take 1 capsule (40 mg total) by mouth  at bedtime. 30 capsule 6  . vitamin B-12 (CYANOCOBALAMIN) 1000 MCG tablet Take 1,000 mcg by mouth daily.     No current facility-administered medications for this visit.   Allergies:  Patient has no known allergies.   ROS:   No palpitations or syncope.  Physical Exam: VS:  BP 110/64   Pulse 81   Ht 5\' 5"  (1.651 m)   Wt 154 lb 9.6 oz (70.1 kg)   SpO2 98%   BMI 25.73 kg/m , BMI Body mass index is 25.73 kg/m.  Wt Readings from Last 3 Encounters:  10/17/19 154 lb 9.6 oz (70.1 kg)  04/02/19 155 lb (70.3 kg)    01/01/19 155 lb (70.3 kg)    General: Patient appears comfortable at rest. HEENT: Conjunctiva and lids normal, wearing a mask. Neck: Supple, no elevated JVP or carotid bruits, no thyromegaly. Lungs: Clear to auscultation, nonlabored breathing at rest. Cardiac: Regular rate and rhythm, no S3 or significant systolic murmur. Extremities: No pitting edema, distal pulses 2+.  ECG:  An ECG dated 04/02/2019 was personally reviewed today and demonstrated:  Sinus rhythm with sinus arrhythmia.  Recent Labwork: 12/05/2018: ALT 21; AST 27; BUN 16; Creatinine, Ser 1.79; Hemoglobin 13.8; Platelets 126; Potassium 4.1; Sodium 136   Other Studies Reviewed Today:  Lexiscan Myoview 02/26/2018:  There was no ST segment deviation noted during stress.  Findings consistent with inferior/inferoseptal/apical prior myocardial infarction. There is no current myocardium at jeopardy.  The left ventricular ejection fraction is hyperdynamic (>65%).  This is a low risk study.  Assessment and Plan:  1.  CAD post DES x2 to the RCA and staged DES to the OM in 2015 followed by DES to the LAD in 2016.  He reports no definite angina symptoms or nitroglycerin use, plan is to continue observation at this point.  ECG from February as noted above.  Continue aspirin, Brilinta, Lipitor, Lopressor, lisinopril HCTZ, and as needed nitroglycerin.  Follow-up with Dr. March for lab work next month as scheduled.  2.  Essential hypertension, blood pressure is very well controlled today.  No changes were made.  Medication Adjustments/Labs and Tests Ordered: Current medicines are reviewed at length with the patient today.  Concerns regarding medicines are outlined above.   Tests Ordered: No orders of the defined types were placed in this encounter.   Medication Changes: No orders of the defined types were placed in this encounter.   Disposition:  Follow up 6 months in the [place] office.  Signed, [place], MD,  [facility] 10/17/2019 4:22 PM     [facility] [address] [place], [place], [place] Phone: [phone]; Fax: [phone]

## 2019-10-18 NOTE — Telephone Encounter (Signed)
Advised that he can try benadryl or melatonin and if this doesn't work, he can contact his PCP for management. Verbalized understanding.

## 2019-11-19 DIAGNOSIS — Z125 Encounter for screening for malignant neoplasm of prostate: Secondary | ICD-10-CM | POA: Diagnosis not present

## 2019-11-19 DIAGNOSIS — Z1339 Encounter for screening examination for other mental health and behavioral disorders: Secondary | ICD-10-CM | POA: Diagnosis not present

## 2019-11-19 DIAGNOSIS — I10 Essential (primary) hypertension: Secondary | ICD-10-CM | POA: Diagnosis not present

## 2019-11-19 DIAGNOSIS — Z6826 Body mass index (BMI) 26.0-26.9, adult: Secondary | ICD-10-CM | POA: Diagnosis not present

## 2019-11-19 DIAGNOSIS — Z0000 Encounter for general adult medical examination without abnormal findings: Secondary | ICD-10-CM | POA: Diagnosis not present

## 2019-11-19 DIAGNOSIS — Z7189 Other specified counseling: Secondary | ICD-10-CM | POA: Diagnosis not present

## 2019-11-19 DIAGNOSIS — M25511 Pain in right shoulder: Secondary | ICD-10-CM | POA: Diagnosis not present

## 2019-11-19 DIAGNOSIS — E7800 Pure hypercholesterolemia, unspecified: Secondary | ICD-10-CM | POA: Diagnosis not present

## 2019-11-19 DIAGNOSIS — Z299 Encounter for prophylactic measures, unspecified: Secondary | ICD-10-CM | POA: Diagnosis not present

## 2019-11-19 DIAGNOSIS — R5383 Other fatigue: Secondary | ICD-10-CM | POA: Diagnosis not present

## 2019-11-19 DIAGNOSIS — Z79899 Other long term (current) drug therapy: Secondary | ICD-10-CM | POA: Diagnosis not present

## 2019-11-19 DIAGNOSIS — Z1331 Encounter for screening for depression: Secondary | ICD-10-CM | POA: Diagnosis not present

## 2019-11-21 DIAGNOSIS — E7800 Pure hypercholesterolemia, unspecified: Secondary | ICD-10-CM | POA: Diagnosis not present

## 2019-11-21 DIAGNOSIS — I10 Essential (primary) hypertension: Secondary | ICD-10-CM | POA: Diagnosis not present

## 2019-11-21 DIAGNOSIS — I2510 Atherosclerotic heart disease of native coronary artery without angina pectoris: Secondary | ICD-10-CM | POA: Diagnosis not present

## 2019-12-10 ENCOUNTER — Inpatient Hospital Stay (HOSPITAL_COMMUNITY): Payer: Medicare Other

## 2019-12-12 ENCOUNTER — Other Ambulatory Visit: Payer: Self-pay

## 2019-12-12 ENCOUNTER — Inpatient Hospital Stay (HOSPITAL_COMMUNITY): Payer: Medicare Other | Attending: Hematology

## 2019-12-12 DIAGNOSIS — N183 Chronic kidney disease, stage 3 unspecified: Secondary | ICD-10-CM | POA: Diagnosis not present

## 2019-12-12 DIAGNOSIS — Z87891 Personal history of nicotine dependence: Secondary | ICD-10-CM | POA: Insufficient documentation

## 2019-12-12 DIAGNOSIS — K219 Gastro-esophageal reflux disease without esophagitis: Secondary | ICD-10-CM | POA: Insufficient documentation

## 2019-12-12 DIAGNOSIS — Z7982 Long term (current) use of aspirin: Secondary | ICD-10-CM | POA: Diagnosis not present

## 2019-12-12 DIAGNOSIS — D696 Thrombocytopenia, unspecified: Secondary | ICD-10-CM | POA: Diagnosis not present

## 2019-12-12 DIAGNOSIS — M1990 Unspecified osteoarthritis, unspecified site: Secondary | ICD-10-CM | POA: Diagnosis not present

## 2019-12-12 DIAGNOSIS — Z79899 Other long term (current) drug therapy: Secondary | ICD-10-CM | POA: Insufficient documentation

## 2019-12-12 DIAGNOSIS — I129 Hypertensive chronic kidney disease with stage 1 through stage 4 chronic kidney disease, or unspecified chronic kidney disease: Secondary | ICD-10-CM | POA: Diagnosis not present

## 2019-12-12 DIAGNOSIS — I2510 Atherosclerotic heart disease of native coronary artery without angina pectoris: Secondary | ICD-10-CM | POA: Diagnosis not present

## 2019-12-12 LAB — CBC WITH DIFFERENTIAL/PLATELET
Abs Immature Granulocytes: 0.02 10*3/uL (ref 0.00–0.07)
Basophils Absolute: 0 10*3/uL (ref 0.0–0.1)
Basophils Relative: 1 %
Eosinophils Absolute: 0.2 10*3/uL (ref 0.0–0.5)
Eosinophils Relative: 3 %
HCT: 40.5 % (ref 39.0–52.0)
Hemoglobin: 12.9 g/dL — ABNORMAL LOW (ref 13.0–17.0)
Immature Granulocytes: 0 %
Lymphocytes Relative: 29 %
Lymphs Abs: 1.7 10*3/uL (ref 0.7–4.0)
MCH: 29.1 pg (ref 26.0–34.0)
MCHC: 31.9 g/dL (ref 30.0–36.0)
MCV: 91.2 fL (ref 80.0–100.0)
Monocytes Absolute: 0.6 10*3/uL (ref 0.1–1.0)
Monocytes Relative: 10 %
Neutro Abs: 3.4 10*3/uL (ref 1.7–7.7)
Neutrophils Relative %: 57 %
Platelets: 124 10*3/uL — ABNORMAL LOW (ref 150–400)
RBC: 4.44 MIL/uL (ref 4.22–5.81)
RDW: 13.7 % (ref 11.5–15.5)
WBC: 5.9 10*3/uL (ref 4.0–10.5)
nRBC: 0 % (ref 0.0–0.2)

## 2019-12-12 LAB — LACTATE DEHYDROGENASE: LDH: 146 U/L (ref 98–192)

## 2019-12-17 ENCOUNTER — Other Ambulatory Visit: Payer: Self-pay

## 2019-12-17 ENCOUNTER — Inpatient Hospital Stay (HOSPITAL_BASED_OUTPATIENT_CLINIC_OR_DEPARTMENT_OTHER): Payer: Medicare Other | Admitting: Hematology

## 2019-12-17 VITALS — BP 150/66 | HR 56 | Temp 96.8°F | Resp 18 | Wt 159.6 lb

## 2019-12-17 DIAGNOSIS — K219 Gastro-esophageal reflux disease without esophagitis: Secondary | ICD-10-CM | POA: Diagnosis not present

## 2019-12-17 DIAGNOSIS — I129 Hypertensive chronic kidney disease with stage 1 through stage 4 chronic kidney disease, or unspecified chronic kidney disease: Secondary | ICD-10-CM | POA: Diagnosis not present

## 2019-12-17 DIAGNOSIS — D696 Thrombocytopenia, unspecified: Secondary | ICD-10-CM | POA: Diagnosis not present

## 2019-12-17 DIAGNOSIS — M1990 Unspecified osteoarthritis, unspecified site: Secondary | ICD-10-CM | POA: Diagnosis not present

## 2019-12-17 DIAGNOSIS — I2510 Atherosclerotic heart disease of native coronary artery without angina pectoris: Secondary | ICD-10-CM | POA: Diagnosis not present

## 2019-12-17 DIAGNOSIS — N183 Chronic kidney disease, stage 3 unspecified: Secondary | ICD-10-CM | POA: Diagnosis not present

## 2019-12-17 NOTE — Progress Notes (Signed)
[facility] [address], [place] [address]   CLINIC:  Medical Oncology/Hematology  PCP:  Kirstie Peri, MD [address] [place] [address]  [phone]  REASON FOR VISIT:  Follow-up for thrombocytopenia  PRIOR THERAPY: None  CURRENT THERAPY: Observation  INTERVAL HISTORY:  [name]., a 74 y.o. male, returns for routine follow-up for his thrombocytopenia. Keith Keith was last seen on 12/12/2018.  Today he reports feeling well. He denies easy bruising, nose bleeds, melena, hematochezia or hematuria. He is still taking Brilinta and ASA 81 and tolerating it well.  He will see his PCP, Dr. Sherryll Burger, in January.  REVIEW OF SYSTEMS:  Review of Systems  Constitutional: Positive for appetite change (75%).  HENT:   Negative for nosebleeds.   Cardiovascular: Positive for chest pain.  Gastrointestinal: Negative for blood in stool.  Genitourinary: Negative for hematuria.   Musculoskeletal: Positive for back pain (back and shoulder pain).  Hematological: Does not bruise/bleed easily.  All other systems reviewed and are negative.   PAST MEDICAL/SURGICAL HISTORY:  Past Medical History:  Diagnosis Date  . Arthritis   . CAD (coronary artery disease)    a. 05/19/13: DES x 2 to RCA, staged DES to OM 05/22/13, residual LAD/diag disease for Rx mgmt  b. 05/20/14 [place] s/p DES to LAD  . CKD (chronic kidney disease), stage III   . Essential hypertension   . GERD (gastroesophageal reflux disease)   . Thrombocytopenia (HCC)    Past Surgical History:  Procedure Laterality Date  . Bilateral hip replacements    . CARDIAC CATHETERIZATION    . COLONOSCOPY W/ POLYPECTOMY  05/2016   COL[facility] POL[facility]PS REMOVED(>3) [facility]  . CORONARY ANGIOPLASTY WITH STENT PLACEMENT  05/20/2014   mid lad   . CORONARY STENT PLACEMENT  05/19/13  . FRACTIONAL FLOW RESERVE WIRE Right 05/22/2013   Procedure: FRACTIONAL FLOW RESERVE WIRE;  Surgeon: Kathleene Hazel, MD;  Location: [facility] CATH LAB;   Service: Cardiovascular;  Laterality: Right;  . LEFT HEART CATHETERIZATION WITH CORONARY ANGIOGRAM N/A 05/19/2013   Procedure: LEFT HEART CATHETERIZATION WITH CORONARY ANGIOGRAM;  Surgeon: Lennette Bihari, MD;  Location: [facility] CATH LAB;  Service: Cardiovascular;  Laterality: N/A;  . LEFT HEART CATHETERIZATION WITH CORONARY ANGIOGRAM N/A 05/20/2014   Procedure: LEFT HEART CATHETERIZATION WITH CORONARY ANGIOGRAM;  Surgeon: Kathleene Hazel, MD;  Location: [facility] CATH LAB;  Service: Cardiovascular;  Laterality: N/A;  . PERCUTANEOUS CORONARY STENT INTERVENTION (PCI-S) N/A 05/22/2013   Procedure: PERCUTANEOUS CORONARY STENT INTERVENTION (PCI-S);  Surgeon: Kathleene Hazel, MD;  Location: [facility] CATH LAB;  Service: Cardiovascular;  Laterality: N/A;    SOCIAL HISTORY:  Social History   Socioeconomic History  . Marital status: Married    Spouse name: Not on file  . Number of children: Not on file  . Years of education: Not on file  . Highest education level: Not on file  Occupational History  . Not on file  Tobacco Use  . Smoking status: Former Smoker    Packs/day: 1.00    Years: 38.00    Pack years: 38.00    Types: Cigarettes    Start date: 10/16/1965    Quit date: 02/21/2002    Years since quitting: 17.8  . Smokeless tobacco: Never Used  Vaping Use  . Vaping Use: Never used  Substance and Sexual Activity  . Alcohol use: Yes    Comment: occ beer  . Drug use: No  . Sexual activity: Not on  file  Other Topics Concern  . 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   Social Determinants of Health   Financial Resource Strain:   . Difficulty of Paying Living Expenses: Not on file  Food Insecurity:   . Worried About Programme researcher, broadcasting/film/video in the Last Year: Not on file  . Ran Out of Food in the Last Year: Not on file  Transportation Needs:   . Lack of Transportation (Medical): Not on file  . Lack of Transportation (Non-Medical): Not on file  Physical  Activity:   . Days of Exercise per Week: Not on file  . Minutes of Exercise per Session: Not on file  Stress:   . Feeling of Stress : Not on file  Social Connections:   . Frequency of Communication with Friends and Family: Not on file  . Frequency of Social Gatherings with Friends and Family: Not on file  . Attends Religious Services: Not on file  . Active Member of Clubs or Organizations: Not on file  . Attends Banker Meetings: Not on file  . Marital Status: Not on file  Intimate Partner Violence:   . Fear of Current or Ex-Partner: Not on file  . Emotionally Abused: Not on file  . Physically Abused: Not on file  . Sexually Abused: Not on file    FAMILY HISTORY:  Family History  Problem Relation Age of Onset  . Aneurysm Mother   . Heart disease Sister   . Diabetes Sister   . Diabetes Sister   . Colon polyps Sister   . Colon cancer Neg Hx     CURRENT MEDICATIONS:  Current Outpatient Medications  Medication Sig Dispense Refill  . aspirin EC 81 MG tablet Take 81 mg by mouth daily.    Marland Kitchen atorvastatin (LIPITOR) 40 MG tablet Take 1 tablet (40 mg total) by mouth every evening. 30 tablet 6  . BRILINTA 90 MG TABS tablet TAKE 1 TABLET TWICE A DAY (NEED APPOINTMENT FOR REFILLS, OVERDUE FOR FOLLOW UP, WAS DUE THIS FEBRUARY) 180 tablet 3  . Cholecalciferol (VITAMIN D3) 25 MCG (1000 UT) CAPS Take 1 capsule by mouth daily.    Marland Kitchen lisinopril-hydrochlorothiazide (PRINZIDE,ZESTORETIC) 20-12.5 MG tablet TAKE 1 TABLET DAILY 90 tablet 1  . metoprolol tartrate (LOPRESSOR) 25 MG tablet Take 25 mg by mouth 2 (two) times daily.    Marland Kitchen omeprazole (PRILOSEC) 40 MG capsule Take 1 capsule (40 mg total) by mouth at bedtime. 30 capsule 6  . vitamin B-12 (CYANOCOBALAMIN) 1000 MCG tablet Take 1,000 mcg by mouth daily.    . nitroGLYCERIN (NITROSTAT) 0.4 MG SL tablet DISSOLVE 1 TABLET UNDER THE TONGUE EVERY 5 MINUTES AS NEEDED FOR CHEST PAIN UP TO 3 DOSES (Patient not taking: Reported on 12/17/2019)  25 tablet 11   No current facility-administered medications for this visit.    ALLERGIES:  No Known Allergies  PHYSICAL EXAM:  Performance status (ECOG): 1 - Symptomatic but completely ambulatory  Vitals:   12/17/19 1116  BP: (!) 150/66  Pulse: (!) 56  Resp: 18  Temp: (!) 96.8 F (36 C)  SpO2: 100%   Wt Readings from Last 3 Encounters:  12/17/19 159 lb 9.6 oz (72.4 kg)  10/17/19 154 lb 9.6 oz (70.1 kg)  04/02/19 155 lb (70.3 kg)   Physical Exam Vitals reviewed.  Constitutional:      Appearance: Normal appearance.  Cardiovascular:     Rate and Rhythm: Normal rate and regular  rhythm.     Pulses: Normal pulses.     Heart sounds: Normal heart sounds.  Pulmonary:     Effort: Pulmonary effort is normal.     Breath sounds: Normal breath sounds.  Abdominal:     Palpations: Abdomen is soft. There is no hepatomegaly, splenomegaly or mass.     Tenderness: There is no abdominal tenderness.     Hernia: No hernia is present.  Musculoskeletal:     Right lower leg: No edema.     Left lower leg: No edema.  Lymphadenopathy:     Lower Body: No right inguinal adenopathy. No left inguinal adenopathy.  Neurological:     General: No focal deficit present.     Mental Status: He is alert and oriented to person, place, and time.  Psychiatric:        Mood and Affect: Mood normal.        Behavior: Behavior normal.     LABORATORY DATA:  I have reviewed the labs as listed.  CBC Latest Ref Rng & Units 12/12/2019 12/05/2018 05/22/2018  WBC 4.0 - 10.5 K/uL 5.9 7.2 5.3  Hemoglobin 13.0 - 17.0 g/dL 12.9(L) 13.8 14.0  Hematocrit 39 - 52 % 40.5 43.4 43.3  Platelets 150 - 400 K/uL 124(L) 126(L) 112(L)   CMP Latest Ref Rng & Units 12/05/2018 05/22/2018 11/09/2017  Glucose 70 - 99 mg/dL 376(E) 831(D) 176(H)  BUN 8 - 23 mg/dL 16 11 11   Creatinine 0.61 - 1.24 mg/dL 6.07(P) 7.10(G) 2.69(S)  Sodium 135 - 145 mmol/L 136 140 139  Potassium 3.5 - 5.1 mmol/L 4.1 4.4 4.1  Chloride 98 - 111 mmol/L 101  104 100  CO2 22 - 32 mmol/L 26 27 30   Calcium 8.9 - 10.3 mg/dL 9.5 9.4 9.7  Total Protein 6.5 - 8.1 g/dL 7.3 7.0 7.2  Total Bilirubin 0.3 - 1.2 mg/dL 0.6 0.7 1.1  Alkaline Phos 38 - 126 U/L 55 66 58  AST 15 - 41 U/L 27 24 27   ALT 0 - 44 U/L 21 23 21       Component Value Date/Time   RBC 4.44 12/12/2019 0959   MCV 91.2 12/12/2019 0959   MCH 29.1 12/12/2019 0959   MCHC 31.9 12/12/2019 0959   RDW 13.7 12/12/2019 0959   LYMPHSABS 1.7 12/12/2019 0959   MONOABS 0.6 12/12/2019 0959   EOSABS 0.2 12/12/2019 0959   BASOSABS 0.0 12/12/2019 0959   Lab Results  Component Value Date   LDH 146 12/12/2019   LDH 149 05/22/2018   LDH 151 11/09/2017    DIAGNOSTIC IMAGING:  I have independently reviewed the scans and discussed with the patient. No results found.   ASSESSMENT:  1.  Mild to moderate thrombocytopenia: - He has history of thrombocytopenia since March 2015. - Ultrasound of the abdomen from 02/02/2017 shows normal-sized spleen with increased hepatic echotexture compatible with fatty infiltrative change. -Nutritional deficiency and infectious causes were ruled out. -Etiology includes immune thrombocytopenia versus MDS. -Denies any major bleeding issues.  Denies any B symptoms.  No infections or hospitalizations. - He is on Brilinta for CAD for last several years.  Denies any bleeding complications.  2.  CKD: -His creatinine ranges between 1.5-1.7.   PLAN:  1.  Mild to moderate thrombocytopenia: -Reviewed labs from 12/12/2019. Hemoglobin slightly decreased to 12.9. Denies any bleeding per rectum or melena. -Platelet count is stable at 124. He continues to be on Brilinta. Reports some minor bleeding when he shaves. -Advised him to check CBC with  his PMD in 6 months. I will follow him in 1 year.  2.  CKD: -Creatinine today stable at 1.79.   Orders placed this encounter:  No orders of the defined types were placed in this encounter.    Doreatha Massed, MD [facility]  Cancer Center [phone]   I, Drue Second, am acting as a scribe for Dr. Payton Mccallum.  I, Doreatha Massed MD, have reviewed the above documentation for accuracy and completeness, and I agree with the above.

## 2019-12-17 NOTE — Patient Instructions (Signed)
[facility] at [facility] Discharge Instructions  You were seen today by Dr. Katragadda. He went over your recent results. Your next appointment will be with the nurse practitioner in 1 year for labs and follow up.   Thank you for choosing [facility] at [facility] to provide your oncology and hematology care.  To afford each patient quality time with our provider, please arrive at least 15 minutes before your scheduled appointment time.   If you have a lab appointment with the Cancer Center please come in thru the Main Entrance and check in at the main information desk  You need to re-schedule your appointment should you arrive 10 or more minutes late.  We strive to give you quality time with our providers, and arriving late affects you and other patients whose appointments are after yours.  Also, if you no show three or more times for appointments you may be dismissed from the clinic at the providers discretion.     Again, thank you for choosing [facility].  Our hope is that these requests will decrease the amount of time that you wait before being seen by our physicians.       _____________________________________________________________  Should you have questions after your visit to [facility], please contact our office at [phone] between the hours of 8:00 a.m. and 4:30 p.m.  Voicemails left after 4:00 p.m. will not be returned until the following business day.  For prescription refill requests, have your pharmacy contact our office and allow 72 hours.    Cancer Center Support Programs:   > Cancer Support Group  2nd Tuesday of the month 1pm-2pm, Journey Room    

## 2020-01-28 DIAGNOSIS — Z23 Encounter for immunization: Secondary | ICD-10-CM | POA: Diagnosis not present

## 2020-02-04 DIAGNOSIS — Z299 Encounter for prophylactic measures, unspecified: Secondary | ICD-10-CM | POA: Diagnosis not present

## 2020-02-04 DIAGNOSIS — J019 Acute sinusitis, unspecified: Secondary | ICD-10-CM | POA: Diagnosis not present

## 2020-02-04 DIAGNOSIS — Z87891 Personal history of nicotine dependence: Secondary | ICD-10-CM | POA: Diagnosis not present

## 2020-02-04 DIAGNOSIS — Z6826 Body mass index (BMI) 26.0-26.9, adult: Secondary | ICD-10-CM | POA: Diagnosis not present

## 2020-02-04 DIAGNOSIS — I2510 Atherosclerotic heart disease of native coronary artery without angina pectoris: Secondary | ICD-10-CM | POA: Diagnosis not present

## 2020-04-22 NOTE — Progress Notes (Signed)
Cardiology Office Note  Date: 04/23/2020   ID: Keith Saltzman., DOB [birth year], MRN [id number]  PCP:  Kirstie Peri, MD  Cardiologist:  Nona Dell, MD Electrophysiologist:  None   Chief Complaint  Patient presents with  . Cardiac follow-up    History of Present Illness: Keith Jolley. is a 75 y.o. male last seen in August 2021.  He is here for a routine visit.  Doing well, reports no active angina or nitroglycerin use in the interim.  He has been playing golf 3 days a week.  I reviewed his medications which are stable from a cardiac perspective.  No spontaneous bleeding problems on aspirin and Brilinta.  Requesting interval lab work from Dr. Sherryll Burger for review.  I personally reviewed his ECG today which shows sinus bradycardia.  Past Medical History:  Diagnosis Date  . Arthritis   . CAD (coronary artery disease)    a. 05/19/13: DES x 2 to RCA, staged DES to OM 05/22/13, residual LAD/diag disease for Rx mgmt  b. 05/20/14 [place] s/p DES to LAD  . CKD (chronic kidney disease), stage III (HCC)   . Essential hypertension   . GERD (gastroesophageal reflux disease)   . Thrombocytopenia (HCC)     Past Surgical History:  Procedure Laterality Date  . Bilateral hip replacements    . CARDIAC CATHETERIZATION    . COLONOSCOPY W/ POLYPECTOMY  05/2016   COL[facility] POL[facility]PS REM[facility]ED(>3) [facility]  . CORONARY ANGIOPLASTY WITH STENT PLACEMENT  05/20/2014   mid lad   . CORONARY STENT PLACEMENT  05/19/13  . FRACTIONAL FLOW RESERVE WIRE Right 05/22/2013   Procedure: FRACTIONAL FLOW RESERVE WIRE;  Surgeon: Kathleene Hazel, MD;  Location: [facility] CATH LAB;  Service: Cardiovascular;  Laterality: Right;  . LEFT HEART CATHETERIZATION WITH CORONARY ANGIOGRAM N/A 05/19/2013   Procedure: LEFT HEART CATHETERIZATION WITH CORONARY ANGIOGRAM;  Surgeon: Lennette Bihari, MD;  Location: [facility] CATH LAB;  Service: Cardiovascular;  Laterality: N/A;  . LEFT HEART CATHETERIZATION WITH CORONARY ANGIOGRAM N/A 05/20/2014    Procedure: LEFT HEART CATHETERIZATION WITH CORONARY ANGIOGRAM;  Surgeon: Kathleene Hazel, MD;  Location: [facility] CATH LAB;  Service: Cardiovascular;  Laterality: N/A;  . PERCUTANEOUS CORONARY STENT INTERVENTION (PCI-S) N/A 05/22/2013   Procedure: PERCUTANEOUS CORONARY STENT INTERVENTION (PCI-S);  Surgeon: Kathleene Hazel, MD;  Location: [facility] CATH LAB;  Service: Cardiovascular;  Laterality: N/A;    Current Outpatient Medications  Medication Sig Dispense Refill  . aspirin EC 81 MG tablet Take 81 mg by mouth daily.    Marland Kitchen atorvastatin (LIPITOR) 40 MG tablet Take 1 tablet (40 mg total) by mouth every evening. 30 tablet 6  . BRILINTA 90 MG TABS tablet TAKE 1 TABLET TWICE A DAY (NEED APPOINTMENT FOR REFILLS, OVERDUE FOR FOLLOW UP, WAS DUE THIS FEBRUARY) 180 tablet 3  . Cholecalciferol (VITAMIN D3) 25 MCG (1000 UT) CAPS Take 1 capsule by mouth daily.    Marland Kitchen lisinopril-hydrochlorothiazide (PRINZIDE,ZESTORETIC) 20-12.5 MG tablet TAKE 1 TABLET DAILY 90 tablet 1  . metoprolol tartrate (LOPRESSOR) 25 MG tablet Take 25 mg by mouth 2 (two) times daily.    Marland Kitchen omeprazole (PRILOSEC) 40 MG capsule Take 1 capsule (40 mg total) by mouth at bedtime. 30 capsule 6  . vitamin B-12 (CYANOCOBALAMIN) 1000 MCG tablet Take 1,000 mcg by mouth daily.    . nitroGLYCERIN (NITROSTAT) 0.4 MG SL tablet Place 1 tablet (0.4 mg total) under the tongue every 5 (five) minutes x 3 doses as needed for chest pain (  if no relief after 2nd dose, proceed to the ED for an evaluation or call 911). 25 tablet 11   No current facility-administered medications for this visit.   Allergies:  Patient has no known allergies.   ROS: No palpitations or syncope.  Physical Exam: VS:  BP (!) 144/62   Pulse (!) 55   Ht 5\' 5"  (1.651 m)   Wt 160 lb (72.6 kg)   SpO2 96%   BMI 26.63 kg/m , BMI Body mass index is 26.63 kg/m.  Wt Readings from Last 3 Encounters:  04/23/20 160 lb (72.6 kg)  12/17/19 159 lb 9.6 oz (72.4 kg)  10/17/19 154 lb 9.6 oz  (70.1 kg)    General: Patient appears comfortable at rest. HEENT: Conjunctiva and lids normal. Neck: Supple, no elevated JVP or carotid bruits, no thyromegaly. Lungs: Clear to auscultation, nonlabored breathing at rest. Cardiac: Regular rate and rhythm, no S3 or significant systolic murmur. Extremities: No pitting edema.  ECG:  An ECG dated 04/02/2019 was personally reviewed today and demonstrated:  Sinus arrhythmia.  Recent Labwork: 12/12/2019: Hemoglobin 12.9; Platelets 124   Other Studies Reviewed Today:  Lexiscan Myoview 02/26/2018:  There was no ST segment deviation noted during stress.  Findings consistent with inferior/inferoseptal/apical prior myocardial infarction. There is no current myocardium at jeopardy.  The left ventricular ejection fraction is hyperdynamic (>65%).  This is a low risk study.  Assessment and Plan:  1.  CAD status post DES x2 to the RCA and staged DES to the OM in 2015 as well as DES to the LAD in 2016.  He is doing well without active angina at this time on medical therapy.  ECG reviewed and stable.  Continue aspirin, Brilinta, Lipitor, Prinzide, Lopressor, and as needed nitroglycerin which will be refilled.  2.  Essential hypertension, systolic in the 140s today.  He reports compliance with medical therapy.  No changes were made today.  Medication Adjustments/Labs and Tests Ordered: Current medicines are reviewed at length with the patient today.  Concerns regarding medicines are outlined above.   Tests Ordered: Orders Placed This Encounter  Procedures  . EKG 12-Lead    Medication Changes: Meds ordered this encounter  Medications  . nitroGLYCERIN (NITROSTAT) 0.4 MG SL tablet    Sig: Place 1 tablet (0.4 mg total) under the tongue every 5 (five) minutes x 3 doses as needed for chest pain (if no relief after 2nd dose, proceed to the ED for an evaluation or call 911).    Dispense:  25 tablet    Refill:  11    Disposition:  Follow up 6  months.  Signed, 2017, MD, [facility] 04/23/2020 10:02 AM    [facility] at [facility] [address] [place], [place], [place] Phone: [phone]; Fax: [phone]

## 2020-04-23 ENCOUNTER — Encounter: Payer: Self-pay | Admitting: Cardiology

## 2020-04-23 ENCOUNTER — Ambulatory Visit (INDEPENDENT_AMBULATORY_CARE_PROVIDER_SITE_OTHER): Payer: Medicare Other | Admitting: Cardiology

## 2020-04-23 VITALS — BP 144/62 | HR 55 | Ht 65.0 in | Wt 160.0 lb

## 2020-04-23 DIAGNOSIS — I25119 Atherosclerotic heart disease of native coronary artery with unspecified angina pectoris: Secondary | ICD-10-CM

## 2020-04-23 DIAGNOSIS — I10 Essential (primary) hypertension: Secondary | ICD-10-CM

## 2020-04-23 MED ORDER — NITROGLYCERIN 0.4 MG SL SUBL: 0.4000 mg | SUBLINGUAL_TABLET | SUBLINGUAL | 11 refills | Status: DC | PRN

## 2020-04-23 NOTE — Patient Instructions (Addendum)

## 2020-06-25 ENCOUNTER — Other Ambulatory Visit: Payer: Self-pay | Admitting: Cardiology

## 2020-10-27 ENCOUNTER — Encounter: Payer: Self-pay | Admitting: *Deleted

## 2020-10-27 ENCOUNTER — Ambulatory Visit (INDEPENDENT_AMBULATORY_CARE_PROVIDER_SITE_OTHER): Payer: Medicare Other | Admitting: Cardiology

## 2020-10-27 ENCOUNTER — Encounter: Payer: Self-pay | Admitting: Cardiology

## 2020-10-27 ENCOUNTER — Other Ambulatory Visit: Payer: Self-pay

## 2020-10-27 VITALS — BP 130/62 | HR 76 | Ht 65.0 in | Wt 153.0 lb

## 2020-10-27 DIAGNOSIS — I10 Essential (primary) hypertension: Secondary | ICD-10-CM

## 2020-10-27 DIAGNOSIS — I25119 Atherosclerotic heart disease of native coronary artery with unspecified angina pectoris: Secondary | ICD-10-CM

## 2020-10-27 MED ORDER — NITROGLYCERIN 0.4 MG SL SUBL: 0.4000 mg | SUBLINGUAL_TABLET | SUBLINGUAL | 3 refills | Status: DC | PRN

## 2020-10-27 NOTE — Progress Notes (Signed)
Cardiology Office Note  Date: 10/27/2020   ID: Keith Romanoski., DOB 15-Aug-1945, MRN [id number]  PCP:  Kirstie Peri, MD  Cardiologist:  Nona Dell, MD Electrophysiologist:  None   Chief Complaint  Patient presents with   Cardiac follow-up    History of Present Illness: Keith Keith. is a 75 y.o. male last seen in March.  He is here for a follow-up visit.  He tells me that he got back from a trip to [place] in August, had experienced an episode of significant fatigue followed by nausea and emesis while he was playing golf, only made it through 13 holes.  He thought that he might of gotten overheated.  Since coming home he was also helping clean out a shed with his wife, lifting boxes, and had similar symptoms.  He has not used any nitroglycerin.  His last ischemic testing was in January 2020 as noted below.  We went over his current medications.  Past Medical History:  Diagnosis Date   Arthritis    CAD (coronary artery disease)    a. 05/19/13: DES x 2 to RCA, staged DES to OM 05/22/13, residual LAD/diag disease for Rx mgmt  b. 05/20/14 [place] s/p DES to LAD   CKD (chronic kidney disease), stage III (HCC)    Essential hypertension    GERD (gastroesophageal reflux disease)    Thrombocytopenia (HCC)     Past Surgical History:  Procedure Laterality Date   Bilateral hip replacements     CARDIAC CATHETERIZATION     COLONOSCOPY W/ POLYPECTOMY  05/2016   [facility]PS [facility](>3[facility]   CORONARY ANGIOPLASTY WITH STENT PLACEMENT  05/20/2014   mid lad    CORONARY STENT PLACEMENT  05/19/13   FRACTIONAL FLOW RESERVE WIRE Right 05/22/2013   Procedure: FRACTIONAL FLOW RESERVE WIRE;  Surgeon: Kathleene Hazel, MD;  Location: [facility] CATH LAB;  Service: Cardiovascular;  Laterality: Right;   LEFT HEART CATHETERIZATION WITH CORONARY ANGIOGRAM N/A 05/19/2013   Procedure: LEFT HEART CATHETERIZATION WITH CORONARY ANGIOGRAM;  Surgeon: Lennette Bihari, MD;  Location: [facility] CATH LAB;  Service:  Cardiovascular;  Laterality: N/A;   LEFT HEART CATHETERIZATION WITH CORONARY ANGIOGRAM N/A 05/20/2014   Procedure: LEFT HEART CATHETERIZATION WITH CORONARY ANGIOGRAM;  Surgeon: Kathleene Hazel, MD;  Location: [facility] CATH LAB;  Service: Cardiovascular;  Laterality: N/A;   PERCUTANEOUS CORONARY STENT INTERVENTION (PCI-S) N/A 05/22/2013   Procedure: PERCUTANEOUS CORONARY STENT INTERVENTION (PCI-S);  Surgeon: Kathleene Hazel, MD;  Location: [facility] CATH LAB;  Service: Cardiovascular;  Laterality: N/A;    Current Outpatient Medications  Medication Sig Dispense Refill   aspirin EC 81 MG tablet Take 81 mg by mouth daily.     atorvastatin (LIPITOR) 40 MG tablet Take 1 tablet (40 mg total) by mouth every evening. 30 tablet 6   Cholecalciferol (VITAMIN D3) 25 MCG (1000 UT) CAPS Take 1 capsule by mouth daily.     lisinopril-hydrochlorothiazide (PRINZIDE,ZESTORETIC) 20-12.5 MG tablet TAKE 1 TABLET DAILY 90 tablet 1   metoprolol tartrate (LOPRESSOR) 25 MG tablet Take 25 mg by mouth 2 (two) times daily.     nitroGLYCERIN (NITROSTAT) 0.4 MG SL tablet Place 1 tablet (0.4 mg total) under the tongue every 5 (five) minutes x 3 doses as needed for chest pain (if no relief after 2nd dose, proceed to the ED for an evaluation or call 911). 25 tablet 11   omeprazole (PRILOSEC) 40 MG capsule Take 1 capsule (40 mg total) by mouth at bedtime.  30 capsule 6   ticagrelor (BRILINTA) 90 MG TABS tablet Take 1 tablet (90 mg total) by mouth 2 (two) times daily. 180 tablet 1   vitamin B-12 (CYANOCOBALAMIN) 1000 MCG tablet Take 1,000 mcg by mouth daily.     No current facility-administered medications for this visit.   Allergies:  Patient has no known allergies.   ROS: No palpitations or syncope.  Physical Exam: VS:  BP 130/62   Pulse 76   Ht 5\' 5"  (1.651 m)   Wt 153 lb (69.4 kg)   SpO2 100%   BMI 25.46 kg/m , BMI Body mass index is 25.46 kg/m.  Wt Readings from Last 3 Encounters:  10/27/20 153 lb (69.4 kg)   04/23/20 160 lb (72.6 kg)  12/17/19 159 lb 9.6 oz (72.4 kg)    General: Patient appears comfortable at rest. HEENT: Conjunctiva and lids normal, wearing a mask. Neck: Supple, no elevated JVP or carotid bruits, no thyromegaly. Lungs: Clear to auscultation, nonlabored breathing at rest. Cardiac: Regular rate and rhythm, no S3 or significant systolic murmur, no pericardial rub. Extremities: No pitting edema.  ECG:  An ECG dated 04/23/2020 was personally reviewed today and demonstrated:  Sinus bradycardia.  Recent Labwork: 12/12/2019: Hemoglobin 12.9; Platelets 124   Other Studies Reviewed Today:  Lexiscan Myoview 02/26/2018: There was no ST segment deviation noted during stress. Findings consistent with inferior/inferoseptal/apical prior myocardial infarction. There is no current myocardium at jeopardy. The left ventricular ejection fraction is hyperdynamic (>65%). This is a low risk study.  Assessment and Plan:  1.  CAD status post DES x2 to the RCA and staged DES to the OM in 2015 followed by DES to the LAD in 2016.  He has had recent symptoms concerning for angina on medical therapy.  Last ischemic work-up was in 2020.  We will proceed with a Lexiscan Myoview and reevaluate ischemic burden.  Continue aspirin, Lipitor, Lopressor, Prinzide, Brilinta, and as needed nitroglycerin.  2.  Essential hypertension, blood pressure control is adequate today.  No changes made to present regimen.  Medication Adjustments/Labs and Tests Ordered: Current medicines are reviewed at length with the patient today.  Concerns regarding medicines are outlined above.   Tests Ordered: No orders of the defined types were placed in this encounter.   Medication Changes: No orders of the defined types were placed in this encounter.   Disposition:  Follow up  test results.  Signed, 2021, MD, [facility] 10/27/2020 4:22 PM    [facility] at [facility] [address] [place], [place],  [place] Phone: [phone]; Fax: [phone]

## 2020-10-27 NOTE — Patient Instructions (Addendum)

## 2020-10-29 ENCOUNTER — Telehealth: Payer: Self-pay | Admitting: Cardiology

## 2020-10-29 NOTE — Telephone Encounter (Signed)
Checking percert on the following patient for testing scheduled at [facility].    LEXISCAN   11/05/2020

## 2020-11-05 ENCOUNTER — Telehealth: Payer: Self-pay | Admitting: *Deleted

## 2020-11-05 ENCOUNTER — Other Ambulatory Visit: Payer: Self-pay

## 2020-11-05 ENCOUNTER — Ambulatory Visit (HOSPITAL_COMMUNITY)
Admission: RE | Admit: 2020-11-05 | Discharge: 2020-11-05 | Disposition: A | Discharge status: Home / Self Care | Payer: Medicare Other | Source: Ambulatory Visit | Attending: Cardiology | Admitting: Cardiology

## 2020-11-05 DIAGNOSIS — I25119 Atherosclerotic heart disease of native coronary artery with unspecified angina pectoris: Secondary | ICD-10-CM | POA: Diagnosis not present

## 2020-11-05 LAB — NM MYOCAR MULTI W/SPECT W/WALL MOTION / EF
LV dias vol: 70 mL (ref 62–150)
LV sys vol: 24 mL
Nuc Stress EF: 66 %
Peak HR: 99 {beats}/min
RATE: 0.3
Rest HR: 57 {beats}/min
Rest Nuclear Isotope Dose: 9.2 mCi
SDS: 0
SRS: 7
SSS: 2
ST Depression (mm): 0 mm
Stress Nuclear Isotope Dose: 30 mCi
TID: 1.01

## 2020-11-05 MED ORDER — TECHNETIUM TC 99M TETROFOSMIN IV KIT
10.0000 | PACK | Freq: Once | INTRAVENOUS | Status: AC | PRN
  Administered 2020-11-05: 9.2 via INTRAVENOUS

## 2020-11-05 MED ORDER — SODIUM CHLORIDE FLUSH 0.9 % IV SOLN
INTRAVENOUS | Status: AC
  Administered 2020-11-05: 10 mL via INTRAVENOUS
  Filled 2020-11-05: qty 10

## 2020-11-05 MED ORDER — TECHNETIUM TC 99M TETROFOSMIN IV KIT
30.0000 | PACK | Freq: Once | INTRAVENOUS | Status: AC | PRN
  Administered 2020-11-05: 30 via INTRAVENOUS

## 2020-11-05 MED ORDER — REGADENOSON 0.4 MG/5ML IV SOLN
INTRAVENOUS | Status: AC
  Administered 2020-11-05: 0.4 mg via INTRAVENOUS
  Filled 2020-11-05: qty 5

## 2020-11-05 NOTE — Telephone Encounter (Signed)
-----   Message from Jonelle Sidle, MD sent at 11/05/2020  3:38 PM EDT ----- Results reviewed.  Please let Mr. Carnell know that his stress test was reassuring, low risk, does not show any significant ischemic territories to suggest progressive obstructive CAD.  I suspect he did have some angina symptoms with the activities he described to me at the office visit.  For now would continue with medical therapy unless symptoms escalate.  We should see him back within the next 6 months.

## 2020-11-05 NOTE — Telephone Encounter (Signed)
Patient informed. Copy sent to PCP °

## 2020-11-10 DIAGNOSIS — Z23 Encounter for immunization: Secondary | ICD-10-CM | POA: Diagnosis not present

## 2020-11-24 DIAGNOSIS — R5383 Other fatigue: Secondary | ICD-10-CM | POA: Diagnosis not present

## 2020-11-24 DIAGNOSIS — I10 Essential (primary) hypertension: Secondary | ICD-10-CM | POA: Diagnosis not present

## 2020-11-24 DIAGNOSIS — I25119 Atherosclerotic heart disease of native coronary artery with unspecified angina pectoris: Secondary | ICD-10-CM | POA: Diagnosis not present

## 2020-11-24 DIAGNOSIS — Z1331 Encounter for screening for depression: Secondary | ICD-10-CM | POA: Diagnosis not present

## 2020-11-24 DIAGNOSIS — E7800 Pure hypercholesterolemia, unspecified: Secondary | ICD-10-CM | POA: Diagnosis not present

## 2020-11-24 DIAGNOSIS — Z79899 Other long term (current) drug therapy: Secondary | ICD-10-CM | POA: Diagnosis not present

## 2020-11-24 DIAGNOSIS — Z6826 Body mass index (BMI) 26.0-26.9, adult: Secondary | ICD-10-CM | POA: Diagnosis not present

## 2020-11-24 DIAGNOSIS — Z125 Encounter for screening for malignant neoplasm of prostate: Secondary | ICD-10-CM | POA: Diagnosis not present

## 2020-11-24 DIAGNOSIS — Z0000 Encounter for general adult medical examination without abnormal findings: Secondary | ICD-10-CM | POA: Diagnosis not present

## 2020-11-24 DIAGNOSIS — Z299 Encounter for prophylactic measures, unspecified: Secondary | ICD-10-CM | POA: Diagnosis not present

## 2020-11-24 DIAGNOSIS — Z7189 Other specified counseling: Secondary | ICD-10-CM | POA: Diagnosis not present

## 2020-11-24 DIAGNOSIS — Z1339 Encounter for screening examination for other mental health and behavioral disorders: Secondary | ICD-10-CM | POA: Diagnosis not present

## 2020-12-22 ENCOUNTER — Other Ambulatory Visit: Payer: Self-pay | Admitting: Cardiology

## 2020-12-22 ENCOUNTER — Inpatient Hospital Stay (HOSPITAL_COMMUNITY): Payer: Medicare Other

## 2020-12-28 ENCOUNTER — Other Ambulatory Visit (HOSPITAL_COMMUNITY): Payer: Self-pay

## 2020-12-28 DIAGNOSIS — D696 Thrombocytopenia, unspecified: Secondary | ICD-10-CM

## 2020-12-29 ENCOUNTER — Inpatient Hospital Stay (HOSPITAL_COMMUNITY): Payer: Medicare Other | Attending: Hematology

## 2020-12-29 ENCOUNTER — Other Ambulatory Visit: Payer: Self-pay

## 2020-12-29 ENCOUNTER — Ambulatory Visit (HOSPITAL_COMMUNITY): Payer: Medicare Other | Admitting: Hematology

## 2020-12-29 DIAGNOSIS — I129 Hypertensive chronic kidney disease with stage 1 through stage 4 chronic kidney disease, or unspecified chronic kidney disease: Secondary | ICD-10-CM | POA: Diagnosis not present

## 2020-12-29 DIAGNOSIS — N183 Chronic kidney disease, stage 3 unspecified: Secondary | ICD-10-CM | POA: Diagnosis not present

## 2020-12-29 DIAGNOSIS — D696 Thrombocytopenia, unspecified: Secondary | ICD-10-CM | POA: Diagnosis not present

## 2020-12-29 DIAGNOSIS — I2510 Atherosclerotic heart disease of native coronary artery without angina pectoris: Secondary | ICD-10-CM | POA: Diagnosis not present

## 2020-12-29 DIAGNOSIS — Z87891 Personal history of nicotine dependence: Secondary | ICD-10-CM | POA: Insufficient documentation

## 2020-12-29 LAB — IRON AND TIBC
Iron: 44 ug/dL — ABNORMAL LOW (ref 45–182)
Saturation Ratios: 10 % — ABNORMAL LOW (ref 17.9–39.5)
TIBC: 429 ug/dL (ref 250–450)
UIBC: 385 ug/dL

## 2020-12-29 LAB — COMPREHENSIVE METABOLIC PANEL
ALT: 16 U/L (ref 0–44)
AST: 20 U/L (ref 15–41)
Albumin: 3.9 g/dL (ref 3.5–5.0)
Alkaline Phosphatase: 57 U/L (ref 38–126)
Anion gap: 6 (ref 5–15)
BUN: 13 mg/dL (ref 8–23)
CO2: 29 mmol/L (ref 22–32)
Calcium: 9.7 mg/dL (ref 8.9–10.3)
Chloride: 100 mmol/L (ref 98–111)
Creatinine, Ser: 1.58 mg/dL — ABNORMAL HIGH (ref 0.61–1.24)
GFR, Estimated: 46 mL/min — ABNORMAL LOW (ref >60)
Glucose, Bld: 139 mg/dL — ABNORMAL HIGH (ref 70–99)
Potassium: 4.5 mmol/L (ref 3.5–5.1)
Sodium: 135 mmol/L (ref 135–145)
Total Bilirubin: 0.4 mg/dL (ref 0.3–1.2)
Total Protein: 7.2 g/dL (ref 6.5–8.1)

## 2020-12-29 LAB — CBC WITH DIFFERENTIAL/PLATELET
Abs Immature Granulocytes: 0.03 10*3/uL (ref 0.00–0.07)
Basophils Absolute: 0 10*3/uL (ref 0.0–0.1)
Basophils Relative: 1 %
Eosinophils Absolute: 0.3 10*3/uL (ref 0.0–0.5)
Eosinophils Relative: 5 %
HCT: 42.4 % (ref 39.0–52.0)
Hemoglobin: 13.2 g/dL (ref 13.0–17.0)
Immature Granulocytes: 1 %
Lymphocytes Relative: 30 %
Lymphs Abs: 1.8 10*3/uL (ref 0.7–4.0)
MCH: 27.5 pg (ref 26.0–34.0)
MCHC: 31.1 g/dL (ref 30.0–36.0)
MCV: 88.3 fL (ref 80.0–100.0)
Monocytes Absolute: 0.5 10*3/uL (ref 0.1–1.0)
Monocytes Relative: 9 %
Neutro Abs: 3.2 10*3/uL (ref 1.7–7.7)
Neutrophils Relative %: 54 %
Platelets: 122 10*3/uL — ABNORMAL LOW (ref 150–400)
RBC: 4.8 MIL/uL (ref 4.22–5.81)
RDW: 13.9 % (ref 11.5–15.5)
WBC: 5.9 10*3/uL (ref 4.0–10.5)
nRBC: 0 % (ref 0.0–0.2)

## 2020-12-29 LAB — FERRITIN: Ferritin: 12 ng/mL — ABNORMAL LOW (ref 24–336)

## 2021-01-03 NOTE — Progress Notes (Signed)
Keith Keith, [place] [address]   CLINIC:  Medical Oncology/Hematology  PCP:  Keith Keith, [place] [address] [phone]   REASON FOR VISIT:  Follow-up for thrombocytopenia  PRIOR THERAPY: None  CURRENT THERAPY: Observation  INTERVAL HISTORY:  Keith Keith 75 y.o. male returns for routine follow-up of his thrombocytopenia.  He was last seen by Dr. Delton Keith on 12/17/2019.  At today's visit, he reports feeling well.  No recent hospitalizations, surgeries, or changes in baseline health status.  Regarding his thrombocytopenia, he denies any bleeding, bruising, or petechial rash.  He remains on both Brilinta and aspirin 81 mg daily for his coronary artery disease.  He has not noted any B symptoms such as fever, chills, night sweats, unintentional weight loss.  Regarding iron deficiency noted on most recent labs, he reports that he does not take any iron supplement at home.  He denies any source of blood loss such as hematemesis, hematochezia, melena, or epistaxis.  He denies any fatigue, pica, restless legs, dyspnea on exertion, or lightheadedness.  He has 100% energy and 75% appetite. He endorses that he is maintaining a stable weight.    REVIEW OF SYSTEMS:  Review of Systems  Constitutional:  Negative for appetite change, chills, diaphoresis, fatigue, fever and unexpected weight change.  HENT:   Negative for lump/mass and nosebleeds.   Eyes:  Negative for eye problems.  Respiratory:  Negative for cough, hemoptysis and shortness of breath.   Cardiovascular:  Negative for chest pain, leg swelling and palpitations.  Gastrointestinal:  Negative for abdominal pain, blood in stool, constipation, diarrhea, nausea and vomiting.  Genitourinary:  Negative for hematuria.   Skin: Negative.   Neurological:  Negative for dizziness, headaches and light-headedness.  Hematological:  Does not bruise/bleed easily.     PAST MEDICAL/SURGICAL  HISTORY:  Past Medical History:  Diagnosis Date   Arthritis    CAD (coronary artery disease)    a. 05/19/13: DES x 2 to RCA, staged DES to OM 05/22/13, residual LAD/diag disease for Rx mgmt  b. 05/20/14 [place] s/p DES to LAD   CKD (chronic kidney disease), stage III (HCC)    Essential hypertension    GERD (gastroesophageal reflux disease)    Thrombocytopenia (HCC)    Past Surgical History:  Procedure Laterality Date   Bilateral hip replacements     CARDIAC CATHETERIZATION     COLONOSCOPY W/ POLYPECTOMY  05/2016   COLON POLYPS REMOVED(>3) [facility]   CORONARY ANGIOPLASTY WITH STENT PLACEMENT  05/20/2014   mid lad    CORONARY STENT PLACEMENT  05/19/13   FRACTIONAL FLOW RESERVE WIRE Right 05/22/2013   Procedure: FRACTIONAL FLOW RESERVE WIRE;  Surgeon: Burnell Blanks, MD;  Location[facility] C[facility] LAB;  Service: Cardiovascular;  Laterality: Right;   LEFT HEART CATHETERIZATION WITH CORONARY ANGIOGRAM N/A 05/19/2013   Procedure: LEFT HEART CATHETERIZATION WITH CORONARY ANGIOGRAM;  Surgeon: Troy Sine, MD;  Location: [facility] CATH LAB;  Service: Cardiovascular;  Laterality: N/A;   LEFT HEART CATHETERIZATION WITH CORONARY ANGIOGRAM N/A 05/20/2014   Procedure: LEFT HEART CATHETERIZATION WITH CORONARY ANGIOGRAM;  Surgeon: Burnell Blanks, MD;  Location: [facility] CATH LAB;  Service: Cardiovascular;  Laterality: N/A;   PERCUTANEOUS CORONARY STENT INTERVENTION (PCI-S) N/A 05/22/2013   Procedure: PERCUTANEOUS CORONARY STENT INTERVENTION (PCI-S);  Surgeon: Burnell Blanks, MD;  Location: [facility] CATH LAB;  Service: Cardiovascular;  Laterality: N/A;     SOCIAL HISTORY:  Social History   Socioeconomic History  Marital status: Married    Spouse name: Not on file   Number of children: Not on file   Years of education: Not on file   Highest education level: Not on file  Occupational History   Not on file  Tobacco Use   Smoking status: Former    Packs/day: 1.00    Years: 38.00    Pack years: 38.00     Types: Cigarettes    Start date: 10/16/1965    Quit date: 02/21/2002    Years since quitting: 18.8   Smokeless tobacco: Never  Vaping Use   Vaping Use: Never used  Substance and Sexual Activity   Alcohol use: Yes    Comment: occ beer   Drug use: No   Sexual activity: Not on file  Other Topics Concern   Not on file  Social History Narrative   VERY ACTIVE, AVID GOL[employer]. [place] [employer]   Social Determinants of Health   Financial Resource Strain: Not on file  Food Insecurity: Not on file  Transportation Needs: Not on file  Physical Activity: Not on file  Stress: Not on file  Social Connections: Not on file  Intimate Partner Violence: Not on file    FAMILY HISTORY:  Family History  Problem Relation Age of Onset   Aneurysm Mother    Heart disease Sister    Diabetes Sister    Diabetes Sister    Colon polyps Sister    Colon cancer Neg Hx     CURRENT MEDICATIONS:  Outpatient Encounter Medications as of 01/05/2021  Medication Sig   aspirin EC 81 MG tablet Take 81 mg by mouth daily.   atorvastatin (LIPITOR) 40 MG tablet Take 1 tablet (40 mg total) by mouth every evening.   BRILINTA 90 MG TABS tablet TAKE 1 TABLET TWICE A DAY   Cholecalciferol (VITAMIN D3) 25 MCG (1000 UT) CAPS Take 1 capsule by mouth daily.   lisinopril-hydrochlorothiazide (PRINZIDE,ZESTORETIC) 20-12.5 MG tablet TAKE 1 TABLET DAILY   metoprolol tartrate (LOPRESSOR) 25 MG tablet Take 25 mg by mouth 2 (two) times daily.   nitroGLYCERIN (NITROSTAT) 0.4 MG SL tablet Place 1 tablet (0.4 mg total) under the tongue every 5 (five) minutes x 3 doses as needed for chest pain (if no relief after 2nd dose, proceed to the ED for an evaluation or call 911).   omeprazole (PRILOSEC) 40 MG capsule Take 1 capsule (40 mg total) by mouth at bedtime.   vitamin B-12 (CYANOCOBALAMIN) 1000 MCG tablet Take 1,000 mcg by mouth daily.   No facility-administered encounter medications on file as of 01/05/2021.    ALLERGIES:  No Known  Allergies   PHYSICAL EXAM:  ECOG PERFORMANCE STATUS: 0 - Asymptomatic  There were no vitals filed for this visit. There were no vitals filed for this visit. Physical Exam Constitutional:      Appearance: Normal appearance.  HENT:     Head: Normocephalic and atraumatic.     Mouth/Throat:     Mouth: Mucous membranes are moist.  Eyes:     Extraocular Movements: Extraocular movements intact.     Pupils: Pupils are equal, round, and reactive to light.  Cardiovascular:     Rate and Rhythm: Normal rate and regular rhythm.     Pulses: Normal pulses.     Heart sounds: Normal heart sounds.  Pulmonary:     Effort: Pulmonary effort is normal.     Breath sounds: Normal breath sounds.  Abdominal:     General: Bowel sounds are normal.  Palpations: Abdomen is soft.     Tenderness: There is no abdominal tenderness.  Musculoskeletal:        General: No swelling.     Right lower leg: No edema.     Left lower leg: No edema.  Lymphadenopathy:     Cervical: No cervical adenopathy.  Skin:    General: Skin is warm and dry.  Neurological:     General: No focal deficit present.     Mental Status: He is alert and oriented to person, place, and time.  Psychiatric:        Mood and Affect: Mood normal.        Behavior: Behavior normal.     LABORATORY DATA:  I have reviewed the labs as listed.  CBC    Component Value Date/Time   WBC 5.9 12/29/2020 0853   RBC 4.80 12/29/2020 0853   HGB 13.2 12/29/2020 0853   HCT 42.4 12/29/2020 0853   PLT 122 (L) 12/29/2020 0853   MCV 88.3 12/29/2020 0853   MCH 27.5 12/29/2020 0853   MCHC 31.1 12/29/2020 0853   RDW 13.9 12/29/2020 0853   LYMPHSABS 1.8 12/29/2020 0853   MONOABS 0.5 12/29/2020 0853   EOSABS 0.3 12/29/2020 0853   BASOSABS 0.0 12/29/2020 0853   CMP Latest Ref Rng & Units 12/29/2020 12/05/2018 05/22/2018  Glucose 70 - 99 mg/dL 139(H) 125(H) 108(H)  BUN 8 - 23 mg/dL '13 16 11  ' Creatinine 0.61 - 1.24 mg/dL 1.58(H) 1.79(H) 1.56(H)   Sodium 135 - 145 mmol/L 135 136 140  Potassium 3.5 - 5.1 mmol/L 4.5 4.1 4.4  Chloride 98 - 111 mmol/L 100 101 104  CO2 22 - 32 mmol/L '29 26 27  ' Calcium 8.9 - 10.3 mg/dL 9.7 9.5 9.4  Total Protein 6.5 - 8.1 g/dL 7.2 7.3 7.0  Total Bilirubin 0.3 - 1.2 mg/dL 0.4 0.6 0.7  Alkaline Phos 38 - 126 U/L 57 55 66  AST 15 - 41 U/L '20 27 24  ' ALT 0 - 44 U/L '16 21 23    ' DIAGNOSTIC IMAGING:  I have independently reviewed the relevant imaging and discussed with the patient.   ASSESSMENT & PLAN: 1.  Mild to moderate thrombocytopenia: - He has history of thrombocytopenia since March 2015. - Ultrasound of the abdomen from 02/02/2017 shows normal-sized spleen with increased hepatic echotexture compatible with fatty infiltrative change. -Nutritional deficiency and infectious causes were ruled out. -Etiology includes immune thrombocytopenia versus MDS versus thrombocytopenia associated with fatty liver disease. -Denies any major bleeding issues.  Denies any B symptoms.  No infections or hospitalizations.   - He is on Brilinta for CAD for last several years.  Denies any bleeding complications.   - Most recent labs (12/29/2020): Platelets 122, stable at baseline.  No other derangements noted on CBC. - PLAN: No treatment indicated at this time.  Would consider treatment if platelets less than 30.  We will consider additional work-up with bone marrow biopsy if patient had significant decrease in platelets or other derangements of CBC. - Repeat labs and RTC in 1 year  2.  Iron deficiency state without anemia - Most recent labs (12/29/2020): Ferritin 12, iron saturation 10%, Hgb 13.2 with MCV 88.3 - Denies any signs of bleeding such as hematemesis, hematochezia, or melena. - Denies any symptoms of fatigue, pica, restless legs, etc. - Previously seen by [facility] - PLAN: Recommended daily iron supplementation with over-the-counter ferrous sulfate 3 and 25 mg. - Check iron panel at  next annual  visit.  3.  CKD stage III - His creatinine ranges between 1.5-1.7. - Most recent CMP (12/29/2020) shows stable renal function with creatinine 1.58/GFR 46    PLAN SUMMARY & DISPOSITION: Labs and RTC in 1 year  All questions were answered. The patient knows to call the clinic with any problems, questions or concerns.  Medical decision making: Low  Time spent on visit: I spent 20 minutes counseling the patient face to face. The total time spent in the appointment was 30 minutes and more than 50% was on counseling.   Harriett Rush, PA-C  01/05/2021 1:54 PM

## 2021-01-05 ENCOUNTER — Encounter (HOSPITAL_COMMUNITY): Payer: Self-pay | Admitting: Physician Assistant

## 2021-01-05 ENCOUNTER — Other Ambulatory Visit: Payer: Self-pay

## 2021-01-05 ENCOUNTER — Inpatient Hospital Stay (HOSPITAL_BASED_OUTPATIENT_CLINIC_OR_DEPARTMENT_OTHER): Payer: Medicare Other | Admitting: Physician Assistant

## 2021-01-05 VITALS — BP 146/78 | HR 62 | Temp 97.5°F | Resp 18 | Wt 153.2 lb

## 2021-01-05 DIAGNOSIS — D696 Thrombocytopenia, unspecified: Secondary | ICD-10-CM | POA: Diagnosis not present

## 2021-01-05 DIAGNOSIS — I2510 Atherosclerotic heart disease of native coronary artery without angina pectoris: Secondary | ICD-10-CM | POA: Diagnosis not present

## 2021-01-05 DIAGNOSIS — N183 Chronic kidney disease, stage 3 unspecified: Secondary | ICD-10-CM | POA: Diagnosis not present

## 2021-01-05 DIAGNOSIS — Z87891 Personal history of nicotine dependence: Secondary | ICD-10-CM | POA: Diagnosis not present

## 2021-01-05 DIAGNOSIS — I129 Hypertensive chronic kidney disease with stage 1 through stage 4 chronic kidney disease, or unspecified chronic kidney disease: Secondary | ICD-10-CM | POA: Diagnosis not present

## 2021-01-05 DIAGNOSIS — E611 Iron deficiency: Secondary | ICD-10-CM

## 2021-01-05 NOTE — Patient Instructions (Addendum)
[facility] at [facility] Discharge Instructions  You were seen today by Rojelio Brenner PA-C for your low platelets.  As we discussed, this is most likely caused by your fatty liver disease.  There is nothing to be concerned about at this time, but we will continue to check your platelets once a year to make sure that they are not continuing to decrease or showing signs of any other concerning diseases.  Please let us know if you experience any unusual bruising or bleeding, as this could be a sign of critically low platelets.  The only other abnormality on your labs was some mild iron deficiency.  You can start taking oral iron tablet (available over-the-counter) such as ferrous sulfate 325 mg daily.  I recommend that you take this with a glass of orange juice to help with absorb better.  It may cause some constipation, and if that is the case, you can take an over-the-counter stool softener such as Colace.  LABS: Return in 1 year for repeat labs  OTHER TESTS: No other tests at this time  MEDICATIONS: Start taking iron supplement, as above  FOLLOW-UP APPOINTMENT: Office visit in 1 year, after labs   Thank you for choosing [facility] at [facility] to provide your oncology and hematology care.  To afford each patient quality time with our provider, please arrive at least 15 minutes before your scheduled appointment time.   If you have a lab appointment with the Cancer Center please come in thru the Main Entrance and check in at the main information desk.  You need to re-schedule your appointment should you arrive 10 or more minutes late.  We strive to give you quality time with our providers, and arriving late affects you and other patients whose appointments are after yours.  Also, if you no show three or more times for appointments you may be dismissed from the clinic at the providers discretion.     Again, thank you for choosing [facility].  Our hope is that these requests will decrease the amount of time that you wait before being seen by our physicians.       _____________________________________________________________  Should you have questions after your visit to [facility], please contact our office at [phone] and follow the prompts.  Our office hours are 8:00 a.m. and 4:30 p.m. Monday - Friday.  Please note that voicemails left after 4:00 p.m. may not be returned until the following business day.  We are closed weekends and major holidays.  You do have access to a nurse 24-7, just call the main number to the clinic [phone] and do not press any options, hold on the line and a nurse will answer the phone.    For prescription refill requests, have your pharmacy contact our office and allow 72 hours.    Due to Covid, you will need to wear a mask upon entering the hospital. If you do not have a mask, a mask will be given to you at the Main Entrance upon arrival. For doctor visits, patients may have 1 support person age 72 or older with them. For treatment visits, patients can not have anyone with them due to social distancing guidelines and our immunocompromised population.

## 2021-05-07 ENCOUNTER — Ambulatory Visit (INDEPENDENT_AMBULATORY_CARE_PROVIDER_SITE_OTHER): Payer: Medicare Other | Admitting: Cardiology

## 2021-05-07 ENCOUNTER — Encounter: Payer: Self-pay | Admitting: Cardiology

## 2021-05-07 VITALS — BP 114/62 | HR 55 | Ht 65.5 in | Wt 153.2 lb

## 2021-05-07 DIAGNOSIS — E782 Mixed hyperlipidemia: Secondary | ICD-10-CM

## 2021-05-07 DIAGNOSIS — I10 Essential (primary) hypertension: Secondary | ICD-10-CM

## 2021-05-07 DIAGNOSIS — I25119 Atherosclerotic heart disease of native coronary artery with unspecified angina pectoris: Secondary | ICD-10-CM

## 2021-05-07 MED ORDER — TICAGRELOR 60 MG PO TABS: 60.0000 mg | ORAL_TABLET | Freq: Two times a day (BID) | ORAL | 6 refills | Status: DC

## 2021-05-07 NOTE — Patient Instructions (Addendum)
Medication Instructions:  Your physician has recommended you make the following change in your medication:  Decrease brilinta to 60 mg twice daily Continue other medications the same  Labwork: none  Testing/Procedures: none  Follow-Up: Your physician recommends that you schedule a follow-up appointment in: 6 months  Any Other Special Instructions Will Be Listed Below (If Applicable).  If you need a refill on your cardiac medications before your next appointment, please call your pharmacy. 

## 2021-05-07 NOTE — Progress Notes (Signed)
? ? ?Cardiology Office Note ? ?Date: 05/07/2021  ? ?ID: Keith Dancer., DOB February 14, 1946, MRN [id number] ? ?PCP:  [name], MD  ?Cardiologist:  Rozann Lesches, MD ?Electrophysiologist:  None  ? ?Chief Complaint  ?Patient presents with  ? Cardiac follow-up  ? ? ?History of Present Illness: ?Keith Zerkel. is a 76 y.o. male last seen in September 2022.  He is here for a follow-up visit.  Reports brief, intermittent episodes of mild chest discomfort that do not require nitroglycerin.  This has not affected his stamina.  He reports compliance with all of his medications as listed below.  Still playing golf on a regular basis. ? ?Follow-up Lexiscan Myoview in September 2022 was low risk as noted below.  I personally reviewed his ECG today which shows sinus bradycardia.  No significant changes.  Last LDL was 50. ? ?Past Medical History:  ?Diagnosis Date  ? Arthritis   ? CAD (coronary artery disease)   ? a. 05/19/13: DES x 2 to RCA, staged DES to OM 05/22/13, residual LAD/diag disease for Rx mgmt  b. 05/20/14 [place] s/p DES to LAD  ? CKD (chronic kidney disease), stage III ([place])   ? Essential hypertension   ? GERD (gastroesophageal reflux disease)   ? Thrombocytopenia ([place])   ? ? ?Past Surgical History:  ?Procedure Laterality Date  ? Bilateral hip replacements    ? CARDIAC CATHETERIZATION    ? COLONOSCOPY W/ POLYPECTOMY  05/2016  ? COL[facility] POL[facility]PS REMOVED(>3) [facility]  ? CORONARY ANGIOPLASTY WITH STENT PLACEMENT  05/20/2014  ? mid lad   ? CORONARY STENT PLACEMENT  05/19/13  ? FRACTIONAL FLOW RESERVE WIRE Right 05/22/2013  ? Procedure: FRACTIONAL FLOW RESERVE WIRE;  Surgeon: Burnell Blanks, MD;  Location: [facility] CATH LAB;  Service: Cardiovascular;  Laterality: Right;  ? LEFT HEART CATHETERIZATION WITH CORONARY ANGIOGRAM N/A 05/19/2013  ? Procedure: LEFT HEART CATHETERIZATION WITH CORONARY ANGIOGRAM;  Surgeon: Troy Sine, MD;  Location: [facility] CATH LAB;  Service: Cardiovascular;  Laterality: N/A;  ? LEFT HEART  CATHETERIZATION WITH CORONARY ANGIOGRAM N/A 05/20/2014  ? Procedure: LEFT HEART CATHETERIZATION WITH CORONARY ANGIOGRAM;  Surgeon: Burnell Blanks, MD;  Location: [facility] CATH LAB;  Service: Cardiovascular;  Laterality: N/A;  ? PERCUTANEOUS CORONARY STENT INTERVENTION (PCI-S) N/A 05/22/2013  ? Procedure: PERCUTANEOUS CORONARY STENT INTERVENTION (PCI-S);  Surgeon: Burnell Blanks, MD;  Location: [facility] CATH LAB;  Service: Cardiovascular;  Laterality: N/A;  ? ? ?Current Outpatient Medications  ?Medication Sig Dispense Refill  ? aspirin EC 81 MG tablet Take 81 mg by mouth daily.    ? atorvastatin (LIPITOR) 40 MG tablet Take 1 tablet (40 mg total) by mouth every evening. 30 tablet 6  ? Cholecalciferol (VITAMIN D3) 25 MCG (1000 UT) CAPS Take 1 capsule by mouth daily.    ? lisinopril-hydrochlorothiazide (PRINZIDE,ZESTORETIC) 20-12.5 MG tablet TAKE 1 TABLET DAILY 90 tablet 1  ? metoprolol tartrate (LOPRESSOR) 25 MG tablet Take 25 mg by mouth 2 (two) times daily.    ? nitroGLYCERIN (NITROSTAT) 0.4 MG SL tablet Place 1 tablet (0.4 mg total) under the tongue every 5 (five) minutes x 3 doses as needed for chest pain (if no relief after 2nd dose, proceed to the ED for an evaluation or call 911). 25 tablet 3  ? omeprazole (PRILOSEC) 40 MG capsule Take 1 capsule (40 mg total) by mouth at bedtime. 30 capsule 6  ? ticagrelor (BRILINTA) 60 MG TABS tablet Take 1 tablet (60 mg total) by mouth  2 (two) times daily. 180 each 6  ? vitamin B-12 (CYANOCOBALAMIN) 1000 MCG tablet Take 1,000 mcg by mouth daily.    ? ?No current facility-administered medications for this visit.  ? ?Allergies:  Patient has no known allergies.  ? ?ROS: Palpitations or syncope. ? ?Physical Exam: ?VS:  BP 114/62   Pulse (!) 55   Ht 5' 5.5" (1.664 m)   Wt 153 lb 3.2 oz (69.5 kg)   SpO2 100%   BMI 25.11 kg/m? , BMI Body mass index is 25.11 kg/m?. ? ?Wt Readings from Last 3 Encounters:  ?05/07/21 153 lb 3.2 oz (69.5 kg)  ?01/05/21 153 lb 3.5 oz (69.5 kg)   ?10/27/20 153 lb (69.4 kg)  ?  ?General: Patient appears comfortable at rest. ?HEENT: Conjunctiva and lids normal, wearing a mask. ?Neck: Supple, no elevated JVP or carotid bruits, no thyromegaly. ?Lungs: Clear to auscultation, nonlabored breathing at rest. ?Cardiac: Regular rate and rhythm, no S3 or significant systolic murmur. ?Extremities: No pitting edema. ? ?ECG:  An ECG dated 04/23/2020 was personally reviewed today and demonstrated:  Sinus bradycardia. ? ?Recent Labwork: ?October 2022: BUN 15, creatinine 1.89, potassium 4.2, AST 23, ALT 17, cholesterol 99, triglycerides 85, HDL 32, LDL 50, TSH 0.70 ?12/29/2020: ALT 16; AST 20; BUN 13; Creatinine, Ser 1.58; Hemoglobin 13.2; Platelets 122; Potassium 4.5; Sodium 135  ? ?Other Studies Reviewed Today: ? ?Lexiscan Myoview 11/05/2020: ?  The study is low risk. ?  No ST deviation was noted. The ECG was negative for ischemia. ?  LV perfusion is abnormal. Defect 1: There is a small defect with mild reduction in uptake present in the apical inferior location(s) that is fixed. There is normal wall motion in the defect area. Consistent with artifact caused by diaphragmatic attenuation. Defect 2: There is a small defect with mild reduction in uptake present in the basal inferoseptal location(s) that is fixed. There is normal wall motion in the defect area. Consistent with artifact caused by diaphragmatic attenuation. ?  Left ventricular function is normal. Nuclear stress EF: 66 %. ?  Prior study available for comparison from 02/26/2018. Previous [employer] was consistent with inferior/inferoseptal/apical scar with no active ischemia.  The present study is more suggestive of diaphragmatic attenuation affecting the inferior wall, again no significant ischemia. ?  ?Evidence of diaphragmatic attenuation affecting inferior wall perfusion imaging, most prominent at rest and not suggestive of scar given normal wall motion in this distribution. There are no ischemic territories.   LVEF normal at 66%.  Low risk. ? ?Assessment and Plan: ? ?1.  Symptomatically stable CAD status post DES x2 to the RCA and staged DES to the OM in 2015 followed by DES to the LAD in 2016.  Follow-up Lexiscan Myoview in September 2022 was low risk.  ECG reviewed today without significant change.  Continue observation on medical therapy.  Reduce Brilinta to 60 mg twice daily and otherwise continue aspirin, Lopressor, Prinzide, Lipitor, and as needed nitroglycerin. ? ?2.  Mixed hyperlipidemia, doing well on Lipitor with last LDL 50. ? ?3.  Essential hypertension, blood pressure is well controlled today. ? ?Medication Adjustments/Labs and Tests Ordered: ?Current medicines are reviewed at length with the patient today.  Concerns regarding medicines are outlined above.  ? ?Tests Ordered: ?Orders Placed This Encounter  ?Procedures  ? EKG 12-Lead  ? ? ?Medication Changes: ?Meds ordered this encounter  ?Medications  ? ticagrelor (BRILINTA) 60 MG TABS tablet  ?  Sig: Take 1 tablet (60 mg total) by mouth  2 (two) times daily.  ?  Dispense:  180 each  ?  Refill:  6  ?  05/07/2021 dose decrease  ? ? ?Disposition:  Follow up  6 months. ? ?Signed, ?Satira Sark, MD, [facility] ?05/07/2021 1:17 PM    ?[facility] ?[address], [place], [place] [address] ?Phone: [phone]; Fax: [phone]  ?

## 2021-05-20 DIAGNOSIS — F0390 Unspecified dementia without behavioral disturbance: Secondary | ICD-10-CM | POA: Diagnosis not present

## 2021-05-20 DIAGNOSIS — I10 Essential (primary) hypertension: Secondary | ICD-10-CM | POA: Diagnosis not present

## 2021-05-20 DIAGNOSIS — E1165 Type 2 diabetes mellitus with hyperglycemia: Secondary | ICD-10-CM | POA: Diagnosis not present

## 2021-05-20 DIAGNOSIS — E785 Hyperlipidemia, unspecified: Secondary | ICD-10-CM | POA: Diagnosis not present

## 2021-05-25 DIAGNOSIS — Z87891 Personal history of nicotine dependence: Secondary | ICD-10-CM | POA: Diagnosis not present

## 2021-05-25 DIAGNOSIS — I25119 Atherosclerotic heart disease of native coronary artery with unspecified angina pectoris: Secondary | ICD-10-CM | POA: Diagnosis not present

## 2021-05-25 DIAGNOSIS — Z299 Encounter for prophylactic measures, unspecified: Secondary | ICD-10-CM | POA: Diagnosis not present

## 2021-05-25 DIAGNOSIS — I10 Essential (primary) hypertension: Secondary | ICD-10-CM | POA: Diagnosis not present

## 2021-05-25 DIAGNOSIS — Z6826 Body mass index (BMI) 26.0-26.9, adult: Secondary | ICD-10-CM | POA: Diagnosis not present

## 2021-10-07 ENCOUNTER — Telehealth: Payer: Self-pay | Admitting: *Deleted

## 2021-10-07 ENCOUNTER — Telehealth: Payer: Self-pay

## 2021-10-07 NOTE — Telephone Encounter (Signed)
   Name: Keith Keith.  DOB: 1945-05-21  MRN: [id number]  Primary Cardiologist: Nona Dell, MD   Preoperative team, please contact this patient and set up a phone call appointment for further preoperative risk assessment. Please obtain consent and complete medication review. Thank you for your help.  I confirm that guidance regarding antiplatelet and oral anticoagulation therapy has been completed and, if necessary, noted below.  Per office protocol, if patient is without any new symptoms or concerns at the time of his virtual visit, he may hold Brilinta for 5 days prior to procedure. Please resume Brilinta as soon as possible postprocedure, at the discretion of the surgeon.    Joylene Grapes, NP 10/07/2021, 8:37 AM [facility]

## 2021-10-07 NOTE — Telephone Encounter (Signed)
Pt is scheduled for a tele visit, 10/08/21 3:20.

## 2021-10-07 NOTE — Telephone Encounter (Signed)
   Pre-operative Risk Assessment    Patient Name: Keith Keith.  DOB: 03-05-1945 MRN: [id number]   Request for Surgical Clearance    Procedure:   Colonoscopy  Date of Surgery:  Clearance 10/14/21                                 Surgeon:  Dr. Angelyn Punt Surgeon's Group or Practice Name:  [facility] Phone number:  [phone] Fax number:  [phone]   Type of Clearance Requested:   - Medical    Type of Anesthesia:  Not Indicated   Additional requests/questions:   As per protocol regarding anticoagulant therapy, we would like to have your patient hold Brilinta for 7 days prior to the procedure.  Signed, Delsa Sale   10/07/2021, 8:10 AM

## 2021-10-07 NOTE — Telephone Encounter (Signed)
Pt has been scheduled for a tele visit, 10/08/21 3:20.  Consent on file / medications reconciled.    Patient Consent for Virtual Visit        Keith Keith. has provided verbal consent on 10/07/2021 for a virtual visit (video or telephone).   CONSENT FOR VIRTUAL VISIT FOR:  Keith Keith.  By participating in this virtual visit I agree to the following:  I hereby voluntarily request, consent and authorize [facility] and its employed or contracted physicians, physician assistants, nurse practitioners or other licensed health care professionals (the Practitioner), to provide me with telemedicine health care services (the "Services") as deemed necessary by the treating Practitioner. I acknowledge and consent to receive the Services by the Practitioner via telemedicine. I understand that the telemedicine visit will involve communicating with the Practitioner through live audiovisual communication technology and the disclosure of certain medical information by electronic transmission. I acknowledge that I have been given the opportunity to request an in-person assessment or other available alternative prior to the telemedicine visit and am voluntarily participating in the telemedicine visit.  I understand that I have the right to withhold or withdraw my consent to the use of telemedicine in the course of my care at any time, without affecting my right to future care or treatment, and that the Practitioner or I may terminate the telemedicine visit at any time. I understand that I have the right to inspect all information obtained and/or recorded in the course of the telemedicine visit and may receive copies of available information for a reasonable fee.  I understand that some of the potential risks of receiving the Services via telemedicine include:  Delay or interruption in medical evaluation due to technological equipment failure or disruption; Information transmitted may not be sufficient  (e.g. poor resolution of images) to allow for appropriate medical decision making by the Practitioner; and/or  In rare instances, security protocols could fail, causing a breach of personal health information.  Furthermore, I acknowledge that it is my responsibility to provide information about my medical history, conditions and care that is complete and accurate to the best of my ability. I acknowledge that Practitioner's advice, recommendations, and/or decision may be based on factors not within their control, such as incomplete or inaccurate data provided by me or distortions of diagnostic images or specimens that may result from electronic transmissions. I understand that the practice of medicine is not an exact science and that Practitioner makes no warranties or guarantees regarding treatment outcomes. I acknowledge that a copy of this consent can be made available to me via my patient portal [facility] MyChart), or I can request a printed copy by calling the office of [facility].    I understand that my insurance will be billed for this visit.   I have read or had this consent read to me. I understand the contents of this consent, which adequately explains the benefits and risks of the Services being provided via telemedicine.  I have been provided ample opportunity to ask questions regarding this consent and the Services and have had my questions answered to my satisfaction. I give my informed consent for the services to be provided through the use of telemedicine in my medical care

## 2021-10-08 ENCOUNTER — Ambulatory Visit (INDEPENDENT_AMBULATORY_CARE_PROVIDER_SITE_OTHER): Payer: Medicare Other | Admitting: Nurse Practitioner

## 2021-10-08 DIAGNOSIS — Z01810 Encounter for preprocedural cardiovascular examination: Secondary | ICD-10-CM | POA: Diagnosis not present

## 2021-10-08 NOTE — Progress Notes (Signed)
Virtual Visit via Telephone Note   Because of Keith SEELEY Jr.'s co-morbid illnesses, he is at least at moderate risk for complications without adequate follow up.  This format is felt to be most appropriate for this patient at this time.  The patient did not have access to video technology/had technical difficulties with video requiring transitioning to audio format only (telephone).  All issues noted in this document were discussed and addressed.  No physical exam could be performed with this format.  Please refer to the patient's chart for his consent to telehealth for [facility].  Evaluation Performed:  Preoperative cardiovascular risk assessment _____________   Date:  10/08/2021   Patient ID:  Keith Journey., DOB 08/09/1945, MRN [id number] Patient Location:  Home Provider location:   Office  Primary Care Provider:  Kirstie Peri, MD Primary Cardiologist:  Nona Dell, MD  Chief Complaint / Patient Profile   76 y.o. y/o male with a h/o CAD s/p DES-RCAx2, DES-OM, DES-LAD, hypertension, and hyperlipidemia who is pending colonoscopy on 10/14/2021 with Dr. Angelyn Punt of [facility] and presents today for telephonic preoperative cardiovascular risk assessment.  Past Medical History    Past Medical History:  Diagnosis Date   Arthritis    CAD (coronary artery disease)    a. 05/19/13: DES x 2 to RCA, staged DES to OM 05/22/13, residual LAD/diag disease for Rx mgmt  b. 05/20/14 [place] s/p DES to LAD   CKD (chronic kidney disease), stage III (HCC)    Essential hypertension    GERD (gastroesophageal reflux disease)    Thrombocytopenia (HCC)    Past Surgical History:  Procedure Laterality Date   Bilateral hip replacements     CARDIAC CATHETERIZATION     COLONOSCOPY W/ POLYPECTOMY  05/2016   COLON POLYPS REMOVED(>3) [facility]   CORONARY ANGIOPLASTY WITH STENT PLACEMENT  05/20/2014   mid lad    CORONARY STENT PLACEMENT  05/19/13   FRACTIONAL FLOW RESERVE WIRE Right  05/22/2013   Procedure: FRACTIONAL FLOW RESERVE WIRE;  Surgeon: Kathleene Hazel, MD;  Location: [facility] CATH LAB;  Service: Cardiovascular;  Laterality: Right;   LEFT HEART CATHETERIZATION WITH CORONARY ANGIOGRAM N/A 05/19/2013   Procedure: LEFT HEART CATHETERIZATION WITH CORONARY ANGIOGRAM;  Surgeon: Lennette Bihari, MD;  Location: [facility] CATH LAB;  Service: Cardiovascular;  Laterality: N/A;   LEFT HEART CATHETERIZATION WITH CORONARY ANGIOGRAM N/A 05/20/2014   Procedure: LEFT HEART CATHETERIZATION WITH CORONARY ANGIOGRAM;  Surgeon: Kathleene Hazel, MD;  Location: [facility] CATH LAB;  Service: Cardiovascular;  Laterality: N/A;   PERCUTANEOUS CORONARY STENT INTERVENTION (PCI-S) N/A 05/22/2013   Procedure: PERCUTANEOUS CORONARY STENT INTERVENTION (PCI-S);  Surgeon: Kathleene Hazel, MD;  Location: [facility] CATH LAB;  Service: Cardiovascular;  Laterality: N/A;    Allergies  No Known Allergies  History of Present Illness    Keith Beirne. is a 76 y.o. male who presents via audio/video conferencing for a telehealth visit today.  Pt was last seen in cardiology clinic on 05/07/2021 by Dr. Diona Browner.  At that time Keith Mackie. was doing well.  Did note occasional symptoms possibly concerning for angina.  Of note, stress test in 10/2020 was negative. EKG has been stable. The patient is now pending procedure as outlined above. Since his last visit, he stable from a cardiac standpoint.  He does note occasional chest discomfort, however, he denies any exertional symptoms.  He is active and plays golf without difficulty. He denies palpitations, dyspnea, pnd, orthopnea, n,  v, dizziness, syncope, edema, weight gain, or early satiety. All other systems reviewed and are otherwise negative except as noted above.   Home Medications    Prior to Admission medications   Medication Sig Start Date End Date Taking? Authorizing Provider  aspirin EC 81 MG tablet Take 81 mg by mouth daily.    [provider]   atorvastatin (LIPITOR) 40 MG tablet Take 1 tablet (40 mg total) by mouth every evening. 05/23/13   Dunn, Tacey Ruiz, PA-C  Cholecalciferol (VITAMIN D3) 25 MCG (1000 UT) CAPS Take 1 capsule by mouth daily.    [provider]  lisinopril-hydrochlorothiazide (PRINZIDE,ZESTORETIC) 20-12.5 MG tablet TAKE 1 TABLET DAILY 05/29/15   Antoine Poche, MD  metoprolol tartrate (LOPRESSOR) 25 MG tablet Take 25 mg by mouth 2 (two) times daily.    [provider]  nitroGLYCERIN (NITROSTAT) 0.4 MG SL tablet Place 1 tablet (0.4 mg total) under the tongue every 5 (five) minutes x 3 doses as needed for chest pain (if no relief after 2nd dose, proceed to the ED for an evaluation or call 911). 10/27/20   Jonelle Sidle, MD  omeprazole (PRILOSEC) 40 MG capsule Take 1 capsule (40 mg total) by mouth at bedtime. 11/07/14   Elliot Cousin, MD  ticagrelor (BRILINTA) 60 MG TABS tablet Take 1 tablet (60 mg total) by mouth 2 (two) times daily. 05/07/21   Jonelle Sidle, MD  vitamin B-12 (CYANOCOBALAMIN) 1000 MCG tablet Take 1,000 mcg by mouth daily.    [provider]    Physical Exam    Vital Signs:  Keith Favata. does not have vital signs available for review today.  Given telephonic nature of communication, physical exam is limited. AAOx3. NAD. Normal affect.  Speech and respirations are unlabored.  Accessory Clinical Findings    None  Assessment & Plan    1.  Preoperative Cardiovascular Risk Assessment:  According to the Revised Cardiac Risk Index (RCRI), his Perioperative Risk of Major Cardiac Event is (%): 0.9. His Functional Capacity in METs is: 6.45 according to the Duke Activity Status Index (DASI). Therefore, based on ACC/AHA guidelines, patient would be at acceptable risk for the planned procedure without further cardiovascular testing.  Per office protocol, he may hold Brilinta for 5 days prior to procedure. Please resume Brilinta as soon as possible postprocedure, at the  discretion of the surgeon.   A copy of this note will be routed to requesting surgeon.  Time:   Today, I have spent 7 minutes with the patient with telehealth technology discussing medical history, symptoms, and management plan.     Joylene Grapes, NP  10/08/2021, 3:32 PM

## 2021-11-10 DIAGNOSIS — Z23 Encounter for immunization: Secondary | ICD-10-CM | POA: Diagnosis not present

## 2021-11-11 ENCOUNTER — Other Ambulatory Visit: Payer: Self-pay | Admitting: Cardiology

## 2021-11-11 ENCOUNTER — Ambulatory Visit: Payer: Medicare Other | Attending: Cardiology | Admitting: Cardiology

## 2021-11-11 ENCOUNTER — Encounter: Payer: Self-pay | Admitting: Cardiology

## 2021-11-11 ENCOUNTER — Telehealth: Payer: Self-pay | Admitting: Cardiology

## 2021-11-11 VITALS — BP 126/68 | HR 72 | Ht 65.0 in | Wt 152.8 lb

## 2021-11-11 DIAGNOSIS — E782 Mixed hyperlipidemia: Secondary | ICD-10-CM | POA: Diagnosis not present

## 2021-11-11 DIAGNOSIS — I200 Unstable angina: Secondary | ICD-10-CM

## 2021-11-11 DIAGNOSIS — I10 Essential (primary) hypertension: Secondary | ICD-10-CM | POA: Diagnosis not present

## 2021-11-11 DIAGNOSIS — N1832 Chronic kidney disease, stage 3b: Secondary | ICD-10-CM | POA: Insufficient documentation

## 2021-11-11 DIAGNOSIS — Z01810 Encounter for preprocedural cardiovascular examination: Secondary | ICD-10-CM | POA: Diagnosis not present

## 2021-11-11 DIAGNOSIS — I25119 Atherosclerotic heart disease of native coronary artery with unspecified angina pectoris: Secondary | ICD-10-CM | POA: Insufficient documentation

## 2021-11-11 MED ORDER — SODIUM CHLORIDE 0.9% FLUSH: 3.0000 mL | Freq: Two times a day (BID) | INTRAVENOUS | Status: AC

## 2021-11-11 NOTE — H&P (View-Only) (Signed)
Cardiology Office Note  Date: 11/11/2021   ID: Keith Keith., DOB 03/29/1945, MRN [id number]  PCP:  [name], MD  Cardiologist:  Rozann Lesches, MD Electrophysiologist:  None   Chief Complaint  Patient presents with   Cardiac follow-up    History of Present Illness: Keith Keith. is a 76 y.o. male assessed recently by Ms. Monge NP in August, I reviewed the note.  He is here today with his wife to discuss worsening symptoms.  He tells me that over the last several months he has been experiencing intermittent episodes of left-sided chest aching, generally brief and not necessarily with any certain level of activity.  He also has episodes where he experiences sudden nausea and emesis of liquid, again this is not exertional, sometimes with meals but not always.  Although fairly atypical overall in terms of cardiac symptomatology, he states that this does remind him of symptoms he had with prior coronary events.  He has not used any nitroglycerin and otherwise reports compliance with his medications.  We did have him follow-up with a Myoview last year which was overall low risk as discussed below.  I went over his medications.  He continues to follow with the [facility] system, does have CKD stage IIIb, creatinine 1.6-1.7 range.  Today we discussed possibility of a diagnostic cardiac catheterization (last procedure was in 2016) including risks and benefits, in particular contrast nephropathy.  Last procedure was via right radial approach per Dr. Angelena Form.  He is in agreement to proceed.  I personally reviewed his ECG today which shows normal sinus rhythm.  Past Medical History:  Diagnosis Date   Arthritis    CAD (coronary artery disease)    a. 05/19/13: DES x 2 to RCA, staged DES to OM 05/22/13, residual LAD/diag disease for Rx mgmt  b. 05/20/14 [place] s/p DES to LAD   CKD (chronic kidney disease), stage III (HCC)    Essential hypertension    GERD (gastroesophageal reflux  disease)    Thrombocytopenia (HCC)     Past Surgical History:  Procedure Laterality Date   Bilateral hip replacements     CARDIAC CATHETERIZATION     COLONOSCOPY W/ POLYPECTOMY  05/2016   COLON POLYPS REMOVED(>3) [facility]   CORONARY ANGIOPLASTY WITH STENT PLACEMENT  05/20/2014   mid lad    CORONARY STENT PLACEMENT  05/19/13   FRACTIONAL FLOW RESERVE WIRE Right 05/22/2013   Procedure: FRACTIONAL FLOW RESERVE WIRE;  Surgeon: Burnell Blanks, MD;  Location: [facility] CATH LAB;  Service: Cardiovascular;  Laterality: Right;   LEFT HEART CATHETERIZATION WITH CORONARY ANGIOGRAM N/A 05/19/2013   Procedure: LEFT HEART CATHETERIZATION WITH CORONARY ANGIOGRAM;  Surgeon: Troy Sine, MD;  Location: [facility] CATH LAB;  Service: Cardiovascular;  Laterality: N/A;   LEFT HEART CATHETERIZATION WITH CORONARY ANGIOGRAM N/A 05/20/2014   Procedure: LEFT HEART CATHETERIZATION WITH CORONARY ANGIOGRAM;  Surgeon: Burnell Blanks, MD;  Location: [facility] CATH LAB;  Service: Cardiovascular;  Laterality: N/A;   PERCUTANEOUS CORONARY STENT INTERVENTION (PCI-S) N/A 05/22/2013   Procedure: PERCUTANEOUS CORONARY STENT INTERVENTION (PCI-S);  Surgeon: Burnell Blanks, MD;  Location: [facility] CATH LAB;  Service: Cardiovascular;  Laterality: N/A;    Current Outpatient Medications  Medication Sig Dispense Refill   aspirin EC 81 MG tablet Take 81 mg by mouth daily.     atorvastatin (LIPITOR) 40 MG tablet Take 1 tablet (40 mg total) by mouth every evening. 30 tablet 6   Cholecalciferol (VITAMIN D3) 25 MCG (  1000 UT) CAPS Take 1 capsule by mouth daily.     lisinopril-hydrochlorothiazide (PRINZIDE,ZESTORETIC) 20-12.5 MG tablet TAKE 1 TABLET DAILY 90 tablet 1   metoprolol tartrate (LOPRESSOR) 25 MG tablet Take 25 mg by mouth 2 (two) times daily.     nitroGLYCERIN (NITROSTAT) 0.4 MG SL tablet Place 1 tablet (0.4 mg total) under the tongue every 5 (five) minutes x 3 doses as needed for chest pain (if no relief after 2nd dose, proceed to  the ED for an evaluation or call 911). 25 tablet 3   omeprazole (PRILOSEC) 40 MG capsule Take 1 capsule (40 mg total) by mouth at bedtime. 30 capsule 6   ticagrelor (BRILINTA) 60 MG TABS tablet Take 1 tablet (60 mg total) by mouth 2 (two) times daily. 180 each 6   vitamin B-12 (CYANOCOBALAMIN) 1000 MCG tablet Take 1,000 mcg by mouth daily.     No current facility-administered medications for this visit.   Allergies:  Patient has no known allergies.   Social History: The patient  reports that he quit smoking about 19 years ago. His smoking use included cigarettes. He started smoking about 56 years ago. He has a 38.00 pack-year smoking history. He has never been exposed to tobacco smoke. He has never used smokeless tobacco. He reports current alcohol use. He reports that he does not use drugs.   Family History: The patient's family history includes Aneurysm in his mother; Colon polyps in his sister; Diabetes in his sister and sister; Heart disease in his sister.   ROS: No palpitations or syncope.  Physical Exam: VS:  BP 126/68 (BP Location: Left Arm, Patient Position: Sitting, Cuff Size: Normal)   Pulse 72   Ht 5\' 5"  (1.651 m)   Wt 152 lb 12.8 oz (69.3 kg)   SpO2 100%   BMI 25.43 kg/m , BMI Body mass index is 25.43 kg/m.  Wt Readings from Last 3 Encounters:  11/11/21 152 lb 12.8 oz (69.3 kg)  05/07/21 153 lb 3.2 oz (69.5 kg)  01/05/21 153 lb 3.5 oz (69.5 kg)    General: Patient appears comfortable at rest. HEENT: Conjunctiva and lids normal. Neck: Supple, no elevated JVP or carotid bruits, no thyromegaly. Lungs: Clear to auscultation, nonlabored breathing at rest. Cardiac: Regular rate and rhythm, no S3 or significant systolic murmur, no pericardial rub. Abdomen: Soft, nontender, no hepatomegaly, bowel sounds present, no guarding or rebound. Extremities: No pitting edema, distal pulses 2+. Skin: Warm and dry. Musculoskeletal: No kyphosis. Neuropsychiatric: Alert and oriented x3,  affect grossly appropriate.  ECG:  An ECG dated 05/07/2021 was personally reviewed today and demonstrated:  Sinus bradycardia.  Recent Labwork: October 2022: BUN 15, creatinine 1.89, potassium 4.2, AST 23, ALT 17, cholesterol 99, triglycerides 85, HDL 32, LDL 50, TSH 0.70 12/29/2020: ALT 16; AST 20; BUN 13; Creatinine, Ser 1.58; Hemoglobin 13.2; Platelets 122; Potassium 4.5; Sodium 135   Other Studies Reviewed Today:  Lexiscan Myoview 11/05/2020:   The study is low risk.   No ST deviation was noted. The ECG was negative for ischemia.   LV perfusion is abnormal. Defect 1: There is a small defect with mild reduction in uptake present in the apical inferior location(s) that is fixed. There is normal wall motion in the defect area. Consistent with artifact caused by diaphragmatic attenuation. Defect 2: There is a small defect with mild reduction in uptake present in the basal inferoseptal location(s) that is fixed. There is normal wall motion in the defect area. Consistent with artifact caused  by diaphragmatic attenuation.   Left ventricular function is normal. Nuclear stress EF: 66 %.   Prior study available for comparison from 02/26/2018. Previous [employer] was consistent with inferior/inferoseptal/apical scar with no active ischemia.  The present study is more suggestive of diaphragmatic attenuation affecting the inferior wall, again no significant ischemia.   Evidence of diaphragmatic attenuation affecting inferior wall perfusion imaging, most prominent at rest and not suggestive of scar given normal wall motion in this distribution. There are no ischemic territories.  LVEF normal at 66%.  Low risk.  Assessment and Plan:  1.  CAD status post DES x2 to the RCA and staged DES to the OM in 2015 with subsequent DES to the LAD in 2016.  Lexiscan Myoview from September of last year was low risk.  He reports progressive symptoms since that time including fairly atypical chest pain and also  intermittent episodes of nausea and emesis.  He does indicate that this is similar to prior coronary symptoms, has not used nitroglycerin.  I do wonder if some of this could be GI in etiology.  Given worsening symptoms we discussed proceeding to diagnostic cardiac catheterization.  He does have risk for contrast nephropathy as discussed above with CKD stage IIIb at baseline.  Would need to come in for hydration prior to the procedure and also temporarily interrupt Prinzide.  We will obtain follow-up lab work and get this set up with Dr. Angelena Form.  2.  Essential hypertension.  Blood pressure is well controlled today on current regimen including Prinzide and Lopressor.  3.  Mixed hyperlipidemia, tolerating Lipitor with good LDL control over time, most recently 50.  4.  CKD stage IIIb, creatinine 1.6-1.7 range.  Medication Adjustments/Labs and Tests Ordered: Current medicines are reviewed at length with the patient today.  Concerns regarding medicines are outlined above.   Tests Ordered: Orders Placed This Encounter  Procedures   CBC   Basic metabolic panel   EKG [id number]    Medication Changes: No orders of the defined types were placed in this encounter.   Disposition:  Follow up  after procedure.  Signed, Satira Sark, MD, [facility] 11/11/2021 1:56 PM    [place] [facility] [place], [place], [place] [address] Phone: [phone]; Fax: [phone]

## 2021-11-11 NOTE — Telephone Encounter (Signed)
Left heart cath dx: accelerating angina Dr. Angelena Form 12:00 noon  Checking percert

## 2021-11-11 NOTE — Progress Notes (Signed)
Cardiology Office Note  Date: 11/11/2021   ID: Keith Perkin., DOB 03/29/1945, MRN [id number]  PCP:  [name], MD  Cardiologist:  Rozann Lesches, MD Electrophysiologist:  None   Chief Complaint  Patient presents with   Cardiac follow-up    History of Present Illness: Keith Badie. is a 76 y.o. male assessed recently by Ms. Monge NP in August, I reviewed the note.  He is here today with his wife to discuss worsening symptoms.  He tells me that over the last several months he has been experiencing intermittent episodes of left-sided chest aching, generally brief and not necessarily with any certain level of activity.  He also has episodes where he experiences sudden nausea and emesis of liquid, again this is not exertional, sometimes with meals but not always.  Although fairly atypical overall in terms of cardiac symptomatology, he states that this does remind him of symptoms he had with prior coronary events.  He has not used any nitroglycerin and otherwise reports compliance with his medications.  We did have him follow-up with a Myoview last year which was overall low risk as discussed below.  I went over his medications.  He continues to follow with the [facility] system, does have CKD stage IIIb, creatinine 1.6-1.7 range.  Today we discussed possibility of a diagnostic cardiac catheterization (last procedure was in 2016) including risks and benefits, in particular contrast nephropathy.  Last procedure was via right radial approach per Dr. Angelena Form.  He is in agreement to proceed.  I personally reviewed his ECG today which shows normal sinus rhythm.  Past Medical History:  Diagnosis Date   Arthritis    CAD (coronary artery disease)    a. 05/19/13: DES x 2 to RCA, staged DES to OM 05/22/13, residual LAD/diag disease for Rx mgmt  b. 05/20/14 [place] s/p DES to LAD   CKD (chronic kidney disease), stage III (HCC)    Essential hypertension    GERD (gastroesophageal reflux  disease)    Thrombocytopenia (HCC)     Past Surgical History:  Procedure Laterality Date   Bilateral hip replacements     CARDIAC CATHETERIZATION     COLONOSCOPY W/ POLYPECTOMY  05/2016   COLON POLYPS REMOVED(>3) [facility]   CORONARY ANGIOPLASTY WITH STENT PLACEMENT  05/20/2014   mid lad    CORONARY STENT PLACEMENT  05/19/13   FRACTIONAL FLOW RESERVE WIRE Right 05/22/2013   Procedure: FRACTIONAL FLOW RESERVE WIRE;  Surgeon: Burnell Blanks, MD;  Location: [facility] CATH LAB;  Service: Cardiovascular;  Laterality: Right;   LEFT HEART CATHETERIZATION WITH CORONARY ANGIOGRAM N/A 05/19/2013   Procedure: LEFT HEART CATHETERIZATION WITH CORONARY ANGIOGRAM;  Surgeon: Troy Sine, MD;  Location: [facility] CATH LAB;  Service: Cardiovascular;  Laterality: N/A;   LEFT HEART CATHETERIZATION WITH CORONARY ANGIOGRAM N/A 05/20/2014   Procedure: LEFT HEART CATHETERIZATION WITH CORONARY ANGIOGRAM;  Surgeon: Burnell Blanks, MD;  Location: [facility] CATH LAB;  Service: Cardiovascular;  Laterality: N/A;   PERCUTANEOUS CORONARY STENT INTERVENTION (PCI-S) N/A 05/22/2013   Procedure: PERCUTANEOUS CORONARY STENT INTERVENTION (PCI-S);  Surgeon: Burnell Blanks, MD;  Location: [facility] CATH LAB;  Service: Cardiovascular;  Laterality: N/A;    Current Outpatient Medications  Medication Sig Dispense Refill   aspirin EC 81 MG tablet Take 81 mg by mouth daily.     atorvastatin (LIPITOR) 40 MG tablet Take 1 tablet (40 mg total) by mouth every evening. 30 tablet 6   Cholecalciferol (VITAMIN D3) 25 MCG (  1000 UT) CAPS Take 1 capsule by mouth daily.     lisinopril-hydrochlorothiazide (PRINZIDE,ZESTORETIC) 20-12.5 MG tablet TAKE 1 TABLET DAILY 90 tablet 1   metoprolol tartrate (LOPRESSOR) 25 MG tablet Take 25 mg by mouth 2 (two) times daily.     nitroGLYCERIN (NITROSTAT) 0.4 MG SL tablet Place 1 tablet (0.4 mg total) under the tongue every 5 (five) minutes x 3 doses as needed for chest pain (if no relief after 2nd dose, proceed to  the ED for an evaluation or call 911). 25 tablet 3   omeprazole (PRILOSEC) 40 MG capsule Take 1 capsule (40 mg total) by mouth at bedtime. 30 capsule 6   ticagrelor (BRILINTA) 60 MG TABS tablet Take 1 tablet (60 mg total) by mouth 2 (two) times daily. 180 each 6   vitamin B-12 (CYANOCOBALAMIN) 1000 MCG tablet Take 1,000 mcg by mouth daily.     No current facility-administered medications for this visit.   Allergies:  Patient has no known allergies.   Social History: The patient  reports that he quit smoking about 19 years ago. His smoking use included cigarettes. He started smoking about 56 years ago. He has a 38.00 pack-year smoking history. He has never been exposed to tobacco smoke. He has never used smokeless tobacco. He reports current alcohol use. He reports that he does not use drugs.   Family History: The patient's family history includes Aneurysm in his mother; Colon polyps in his sister; Diabetes in his sister and sister; Heart disease in his sister.   ROS: No palpitations or syncope.  Physical Exam: VS:  BP 126/68 (BP Location: Left Arm, Patient Position: Sitting, Cuff Size: Normal)   Pulse 72   Ht 5\' 5"  (1.651 m)   Wt 152 lb 12.8 oz (69.3 kg)   SpO2 100%   BMI 25.43 kg/m , BMI Body mass index is 25.43 kg/m.  Wt Readings from Last 3 Encounters:  11/11/21 152 lb 12.8 oz (69.3 kg)  05/07/21 153 lb 3.2 oz (69.5 kg)  01/05/21 153 lb 3.5 oz (69.5 kg)    General: Patient appears comfortable at rest. HEENT: Conjunctiva and lids normal. Neck: Supple, no elevated JVP or carotid bruits, no thyromegaly. Lungs: Clear to auscultation, nonlabored breathing at rest. Cardiac: Regular rate and rhythm, no S3 or significant systolic murmur, no pericardial rub. Abdomen: Soft, nontender, no hepatomegaly, bowel sounds present, no guarding or rebound. Extremities: No pitting edema, distal pulses 2+. Skin: Warm and dry. Musculoskeletal: No kyphosis. Neuropsychiatric: Alert and oriented x3,  affect grossly appropriate.  ECG:  An ECG dated 05/07/2021 was personally reviewed today and demonstrated:  Sinus bradycardia.  Recent Labwork: October 2022: BUN 15, creatinine 1.89, potassium 4.2, AST 23, ALT 17, cholesterol 99, triglycerides 85, HDL 32, LDL 50, TSH 0.70 12/29/2020: ALT 16; AST 20; BUN 13; Creatinine, Ser 1.58; Hemoglobin 13.2; Platelets 122; Potassium 4.5; Sodium 135   Other Studies Reviewed Today:  Lexiscan Myoview 11/05/2020:   The study is low risk.   No ST deviation was noted. The ECG was negative for ischemia.   LV perfusion is abnormal. Defect 1: There is a small defect with mild reduction in uptake present in the apical inferior location(s) that is fixed. There is normal wall motion in the defect area. Consistent with artifact caused by diaphragmatic attenuation. Defect 2: There is a small defect with mild reduction in uptake present in the basal inferoseptal location(s) that is fixed. There is normal wall motion in the defect area. Consistent with artifact caused  by diaphragmatic attenuation.   Left ventricular function is normal. Nuclear stress EF: 66 %.   Prior study available for comparison from 02/26/2018. Previous [employer] was consistent with inferior/inferoseptal/apical scar with no active ischemia.  The present study is more suggestive of diaphragmatic attenuation affecting the inferior wall, again no significant ischemia.   Evidence of diaphragmatic attenuation affecting inferior wall perfusion imaging, most prominent at rest and not suggestive of scar given normal wall motion in this distribution. There are no ischemic territories.  LVEF normal at 66%.  Low risk.  Assessment and Plan:  1.  CAD status post DES x2 to the RCA and staged DES to the OM in 2015 with subsequent DES to the LAD in 2016.  Lexiscan Myoview from September of last year was low risk.  He reports progressive symptoms since that time including fairly atypical chest pain and also  intermittent episodes of nausea and emesis.  He does indicate that this is similar to prior coronary symptoms, has not used nitroglycerin.  I do wonder if some of this could be GI in etiology.  Given worsening symptoms we discussed proceeding to diagnostic cardiac catheterization.  He does have risk for contrast nephropathy as discussed above with CKD stage IIIb at baseline.  Would need to come in for hydration prior to the procedure and also temporarily interrupt Prinzide.  We will obtain follow-up lab work and get this set up with Dr. Angelena Form.  2.  Essential hypertension.  Blood pressure is well controlled today on current regimen including Prinzide and Lopressor.  3.  Mixed hyperlipidemia, tolerating Lipitor with good LDL control over time, most recently 50.  4.  CKD stage IIIb, creatinine 1.6-1.7 range.  Medication Adjustments/Labs and Tests Ordered: Current medicines are reviewed at length with the patient today.  Concerns regarding medicines are outlined above.   Tests Ordered: Orders Placed This Encounter  Procedures   CBC   Basic metabolic panel   EKG [id number]    Medication Changes: No orders of the defined types were placed in this encounter.   Disposition:  Follow up  after procedure.  Signed, Satira Sark, MD, [facility] 11/11/2021 1:56 PM    [place] at [place], [place], [place] [address] Phone: [phone]; Fax: [phone]

## 2021-11-11 NOTE — Patient Instructions (Signed)
Medication Instructions:  Your physician recommends that you continue on your current medications as directed. Please refer to the Current Medication list given to you today.  Labwork: BMET & CBC 11/22/2021 Non-[facility]ing [facility] ([place] 8 am   Testing/Procedures: Your physician has requested that you have a cardiac catheterization. Cardiac catheterization is used to diagnose and/or treat various heart conditions. Doctors may recommend this procedure for a number of different reasons. The most common reason is to evaluate chest pain. Chest pain can be a symptom of coronary artery disease (CAD), and cardiac catheterization can show whether plaque is narrowing or blocking your heart's arteries. This procedure is also used to evaluate the valves, as well as measure the blood flow and oxygen levels in different parts of your heart. For further information please visit [email or web address]. Please follow instruction sheet, as given.  Follow-Up: Your physician recommends that you schedule a follow-up appointment in: 4-6 weeks  Any Other Special Instructions Will Be Listed Below (If Applicable).  If you need a refill on your cardiac medications before your next appointment, please call your pharmacy.   [place] A DEPT OF [place] A DEPT OF [place] [id number] [place] [address] Dept: [phone] Loc: [phone]  Keith Keith.  11/11/2021  You are scheduled for a Cardiac Catheterization on Thursday, October 5 with Dr. Lauree Chandler.  1. Please arrive at the [facility] (Main Entrance A) at [facility]: [address] [place], [place] [address] at 7:00 AM (This time is two hours before your procedure to ensure your preparation). Free valet parking service is available.   Special note: Every effort is made to have your procedure done on  time. Please understand that emergencies sometimes delay scheduled procedures.  2. Diet: Do not eat solid foods after midnight.  The patient may have clear liquids until 5am upon the day of the procedure.  3. Labs: You will need to have blood drawn on Monday, October 2 at [facility] ([address] [place], [place]). You do not need to be fasting.  4. Medication instructions in preparation for your procedure: Hold your lisinopril/hctz the day before and the day of your cath.    Contrast Allergy: No  Stop taking, Lisinopril (Zestril or Prinivil) Wednesday, October 4,  On the morning of your procedure, take your Aspirin 81 mg and any morning medicines NOT listed above.  You may use sips of water.  5. Plan for one night stay--bring personal belongings. 6. Bring a current list of your medications and current insurance cards. 7. You MUST have a responsible person to drive you home. 8. Someone MUST be with you the first 24 hours after you arrive home or your discharge will be delayed. 9. Please wear clothes that are easy to get on and off and wear slip-on shoes.  Thank you for allowing [place] to care for you!   -- [facility]

## 2021-11-22 ENCOUNTER — Other Ambulatory Visit: Payer: Self-pay | Admitting: Cardiology

## 2021-11-22 DIAGNOSIS — E782 Mixed hyperlipidemia: Secondary | ICD-10-CM | POA: Diagnosis not present

## 2021-11-22 DIAGNOSIS — Z01810 Encounter for preprocedural cardiovascular examination: Secondary | ICD-10-CM | POA: Diagnosis not present

## 2021-11-22 DIAGNOSIS — I25119 Atherosclerotic heart disease of native coronary artery with unspecified angina pectoris: Secondary | ICD-10-CM | POA: Diagnosis not present

## 2021-11-22 DIAGNOSIS — I200 Unstable angina: Secondary | ICD-10-CM | POA: Diagnosis not present

## 2021-11-22 DIAGNOSIS — N1832 Chronic kidney disease, stage 3b: Secondary | ICD-10-CM | POA: Diagnosis not present

## 2021-11-22 DIAGNOSIS — I10 Essential (primary) hypertension: Secondary | ICD-10-CM | POA: Diagnosis not present

## 2021-11-23 LAB — BASIC METABOLIC PANEL WITH GFR
BUN/Creatinine Ratio: 9 (calc) (ref 6–22)
BUN: 15 mg/dL (ref 7–25)
CO2: 28 mmol/L (ref 20–32)
Calcium: 10 mg/dL (ref 8.6–10.3)
Chloride: 101 mmol/L (ref 98–110)
Creat: 1.66 mg/dL — ABNORMAL HIGH (ref 0.70–1.28)
Glucose, Bld: 96 mg/dL (ref 65–99)
Potassium: 4 mmol/L (ref 3.5–5.3)
Sodium: 139 mmol/L (ref 135–146)
eGFR: 43 mL/min/{1.73_m2} — ABNORMAL LOW (ref >=60)

## 2021-11-23 LAB — CBC WITH DIFFERENTIAL/PLATELET
Absolute Monocytes: 484 cells/uL (ref 200–950)
Basophils Absolute: 42 cells/uL (ref 0–200)
Basophils Relative: 0.8 %
Eosinophils Absolute: 224 cells/uL (ref 15–500)
Eosinophils Relative: 4.3 %
HCT: 40.5 % (ref 38.5–50.0)
Hemoglobin: 13.5 g/dL (ref 13.2–17.1)
Lymphs Abs: 1992 cells/uL (ref 850–3900)
MCH: 28.8 pg (ref 27.0–33.0)
MCHC: 33.3 g/dL (ref 32.0–36.0)
MCV: 86.5 fL (ref 80.0–100.0)
MPV: 11.7 fL (ref 7.5–12.5)
Monocytes Relative: 9.3 %
Neutro Abs: 2460 cells/uL (ref 1500–7800)
Neutrophils Relative %: 47.3 %
Platelets: 133 10*3/uL — ABNORMAL LOW (ref 140–400)
RBC: 4.68 10*6/uL (ref 4.20–5.80)
RDW: 13.9 % (ref 11.0–15.0)
Total Lymphocyte: 38.3 %
WBC: 5.2 10*3/uL (ref 3.8–10.8)

## 2021-11-24 ENCOUNTER — Telehealth: Payer: Self-pay | Admitting: *Deleted

## 2021-11-24 NOTE — Telephone Encounter (Signed)
Cardiac Catheterization scheduled at [facility] for: Thursday November 25, 2021 12 noon Arrival time and place: [place] Entrance A at: 7 AM-pre-procedure hydration  Nothing to eat after midnight prior to procedure, clear liquids until 5 AM day of procedure.  Medication instructions: -Hold:  Lisinopril/HCT-day before and day of procedure -Except hold medications usual morning medications can be taken with sips of water including aspirin 81 mg and Brilinta 60 mg  Confirmed patient has responsible adult to drive home post procedure and be with patient first 24 hours after arriving home.  Patient reports no new symptoms concerning for COVID-19 in the past 10 days.   Reviewed procedure instructions, discussed pre-procedure hydration with patient.

## 2021-11-25 ENCOUNTER — Ambulatory Visit (HOSPITAL_COMMUNITY)
Admission: RE | Admit: 2021-11-25 | Discharge: 2021-11-25 | Disposition: A | Discharge status: Home / Self Care | Payer: Medicare Other | Attending: Cardiovascular Disease | Admitting: Cardiovascular Disease

## 2021-11-25 ENCOUNTER — Other Ambulatory Visit: Payer: Self-pay

## 2021-11-25 ENCOUNTER — Ambulatory Visit (HOSPITAL_COMMUNITY)
Admission: RE | Disposition: A | Discharge status: Home / Self Care | Payer: Self-pay | Source: Home / Self Care | Attending: Cardiovascular Disease

## 2021-11-25 DIAGNOSIS — I200 Unstable angina: Secondary | ICD-10-CM

## 2021-11-25 DIAGNOSIS — Z87891 Personal history of nicotine dependence: Secondary | ICD-10-CM | POA: Diagnosis not present

## 2021-11-25 DIAGNOSIS — N1832 Chronic kidney disease, stage 3b: Secondary | ICD-10-CM | POA: Diagnosis not present

## 2021-11-25 DIAGNOSIS — E782 Mixed hyperlipidemia: Secondary | ICD-10-CM | POA: Diagnosis not present

## 2021-11-25 DIAGNOSIS — I129 Hypertensive chronic kidney disease with stage 1 through stage 4 chronic kidney disease, or unspecified chronic kidney disease: Secondary | ICD-10-CM | POA: Insufficient documentation

## 2021-11-25 DIAGNOSIS — R079 Chest pain, unspecified: Secondary | ICD-10-CM

## 2021-11-25 DIAGNOSIS — I2510 Atherosclerotic heart disease of native coronary artery without angina pectoris: Secondary | ICD-10-CM | POA: Diagnosis not present

## 2021-11-25 DIAGNOSIS — Z79899 Other long term (current) drug therapy: Secondary | ICD-10-CM | POA: Insufficient documentation

## 2021-11-25 DIAGNOSIS — Z955 Presence of coronary angioplasty implant and graft: Secondary | ICD-10-CM | POA: Insufficient documentation

## 2021-11-25 DIAGNOSIS — I25118 Atherosclerotic heart disease of native coronary artery with other forms of angina pectoris: Secondary | ICD-10-CM | POA: Insufficient documentation

## 2021-11-25 HISTORY — PX: LEFT HEART CATH AND CORONARY ANGIOGRAPHY: CATH118249

## 2021-11-25 SURGERY — LEFT HEART CATH AND CORONARY ANGIOGRAPHY
Anesthesia: LOCAL

## 2021-11-25 MED ORDER — HEPARIN (PORCINE) IN NACL 1000-0.9 UT/500ML-% IV SOLN
INTRAVENOUS | Status: AC
  Filled 2021-11-25: qty 1000

## 2021-11-25 MED ORDER — VERAPAMIL HCL 2.5 MG/ML IV SOLN
INTRAVENOUS | Status: DC | PRN
  Administered 2021-11-25: 10 mL via INTRA_ARTERIAL

## 2021-11-25 MED ORDER — HYDRALAZINE HCL 20 MG/ML IJ SOLN: 10.0000 mg | INTRAMUSCULAR | Status: DC | PRN

## 2021-11-25 MED ORDER — SODIUM CHLORIDE 0.9% FLUSH: 3.0000 mL | INTRAVENOUS | Status: DC | PRN

## 2021-11-25 MED ORDER — SODIUM CHLORIDE 0.9 % IV SOLN: INTRAVENOUS | Status: AC

## 2021-11-25 MED ORDER — MIDAZOLAM HCL 2 MG/2ML IJ SOLN
INTRAMUSCULAR | Status: AC
  Filled 2021-11-25: qty 2

## 2021-11-25 MED ORDER — SODIUM CHLORIDE 0.9 % IV SOLN: 250.0000 mL | INTRAVENOUS | Status: DC | PRN

## 2021-11-25 MED ORDER — LABETALOL HCL 5 MG/ML IV SOLN: 10.0000 mg | INTRAVENOUS | Status: DC | PRN

## 2021-11-25 MED ORDER — HEPARIN SODIUM (PORCINE) 1000 UNIT/ML IJ SOLN
INTRAMUSCULAR | Status: AC
  Filled 2021-11-25: qty 10

## 2021-11-25 MED ORDER — ONDANSETRON HCL 4 MG/2ML IJ SOLN: 4.0000 mg | Freq: Four times a day (QID) | INTRAMUSCULAR | Status: DC | PRN

## 2021-11-25 MED ORDER — ASPIRIN 81 MG PO CHEW: 81.0000 mg | CHEWABLE_TABLET | ORAL | Status: DC

## 2021-11-25 MED ORDER — LIDOCAINE HCL (PF) 1 % IJ SOLN
INTRAMUSCULAR | Status: AC
  Filled 2021-11-25: qty 30

## 2021-11-25 MED ORDER — FENTANYL CITRATE (PF) 100 MCG/2ML IJ SOLN
INTRAMUSCULAR | Status: DC | PRN
  Administered 2021-11-25: 50 ug via INTRAVENOUS

## 2021-11-25 MED ORDER — MIDAZOLAM HCL 2 MG/2ML IJ SOLN
INTRAMUSCULAR | Status: DC | PRN
  Administered 2021-11-25: 2 mg via INTRAVENOUS

## 2021-11-25 MED ORDER — SODIUM CHLORIDE 0.9% FLUSH: 3.0000 mL | Freq: Two times a day (BID) | INTRAVENOUS | Status: DC

## 2021-11-25 MED ORDER — FENTANYL CITRATE (PF) 100 MCG/2ML IJ SOLN
INTRAMUSCULAR | Status: AC
  Filled 2021-11-25: qty 2

## 2021-11-25 MED ORDER — HEPARIN (PORCINE) IN NACL 1000-0.9 UT/500ML-% IV SOLN
INTRAVENOUS | Status: DC | PRN
  Administered 2021-11-25 (×2): 500 mL

## 2021-11-25 MED ORDER — LIDOCAINE HCL (PF) 1 % IJ SOLN
INTRAMUSCULAR | Status: DC | PRN
  Administered 2021-11-25: 2 mL via INTRADERMAL

## 2021-11-25 MED ORDER — VERAPAMIL HCL 2.5 MG/ML IV SOLN
INTRAVENOUS | Status: AC
  Filled 2021-11-25: qty 2

## 2021-11-25 MED ORDER — ACETAMINOPHEN 325 MG PO TABS: 650.0000 mg | ORAL_TABLET | ORAL | Status: DC | PRN

## 2021-11-25 MED ORDER — IOHEXOL 350 MG/ML SOLN
INTRAVENOUS | Status: DC | PRN
  Administered 2021-11-25: 30 mL via INTRA_ARTERIAL

## 2021-11-25 MED ORDER — SODIUM CHLORIDE 0.9 % IV SOLN: INTRAVENOUS | Status: DC

## 2021-11-25 SURGICAL SUPPLY — 10 items
BAND ZEPHYR COMPRESS 30 LONG (HEMOSTASIS) IMPLANT
CATH 5FR JL3.5 JR4 ANG PIG MP (CATHETERS) IMPLANT
GLIDESHEATH SLEND SS 6F .021 (SHEATH) IMPLANT
GUIDEWIRE INQWIRE 1.5J.035X260 (WIRE) IMPLANT
INQWIRE 1.5J .035X260CM (WIRE) ×1
KIT HEART LEFT (KITS) ×1 IMPLANT
PACK CARDIAC CATHETERIZATION (CUSTOM PROCEDURE TRAY) ×1 IMPLANT
SYR MEDRAD MARK 7 150ML (SYRINGE) ×1 IMPLANT
TRANSDUCER W/STOPCOCK (MISCELLANEOUS) ×1 IMPLANT
TUBING CIL FLEX 10 FLL-RA (TUBING) ×1 IMPLANT

## 2021-11-25 NOTE — Progress Notes (Signed)
Client called nurse and bleeding noted right radial site and 3cc air instilled in radial band and no further bleeding noted

## 2021-11-25 NOTE — Interval H&P Note (Signed)
History and Physical Interval Note:  11/25/2021 9:42 AM  Keith Keith.  has presented today for surgery, with the diagnosis of accelerating angina.  The various methods of treatment have been discussed with the patient and family. After consideration of risks, benefits and other options for treatment, the patient has consented to  Procedure(s): LEFT HEART CATH AND CORONARY ANGIOGRAPHY (N/A) as a surgical intervention.  The patient's history has been reviewed, patient examined, no change in status, stable for surgery.  I have reviewed the patient's chart and labs.  Questions were answered to the patient's satisfaction.    Cath Lab Visit (complete for each Cath Lab visit)  Clinical Evaluation Leading to the Procedure:   ACS: No.  Non-ACS:    Anginal Classification: CCS III  Anti-ischemic medical therapy: Minimal Therapy (1 class of medications)  Non-Invasive Test Results: No non-invasive testing performed  Prior CABG: No previous CABG        Lauree Chandler

## 2021-11-26 ENCOUNTER — Encounter (HOSPITAL_COMMUNITY): Payer: Self-pay | Admitting: Cardiovascular Disease

## 2021-11-26 MED FILL — Heparin Sodium (Porcine) Inj 1000 Unit/ML: INTRAMUSCULAR | Qty: 10 | Status: AC

## 2021-11-30 ENCOUNTER — Telehealth: Payer: Self-pay | Admitting: Cardiology

## 2021-11-30 NOTE — Telephone Encounter (Signed)
Patient informed and verbalized understanding of plan. 

## 2021-11-30 NOTE — Telephone Encounter (Signed)
Pt had a cath last Thursday, would like to know when he can go back to playing golf.   Please call [phone]

## 2021-12-17 ENCOUNTER — Ambulatory Visit: Payer: Medicare Other | Attending: Cardiology | Admitting: Cardiology

## 2021-12-17 ENCOUNTER — Encounter: Payer: Self-pay | Admitting: Cardiology

## 2021-12-17 VITALS — BP 114/68 | HR 55 | Ht 65.0 in | Wt 155.2 lb

## 2021-12-17 DIAGNOSIS — I25119 Atherosclerotic heart disease of native coronary artery with unspecified angina pectoris: Secondary | ICD-10-CM

## 2021-12-17 DIAGNOSIS — I10 Essential (primary) hypertension: Secondary | ICD-10-CM | POA: Diagnosis not present

## 2021-12-17 DIAGNOSIS — I200 Unstable angina: Secondary | ICD-10-CM | POA: Diagnosis not present

## 2021-12-17 DIAGNOSIS — E782 Mixed hyperlipidemia: Secondary | ICD-10-CM | POA: Diagnosis not present

## 2021-12-17 NOTE — Patient Instructions (Signed)

## 2021-12-17 NOTE — Progress Notes (Signed)
Cardiology Office Note  Date: 12/17/2021   ID: Keith Brustad., DOB 1945/05/11, MRN [id number]  PCP:  Kirstie Peri, MD  Cardiologist:  Nona Dell, MD Electrophysiologist:  None   Chief Complaint  Patient presents with   Cardiac follow-up    History of Present Illness: Keith Keith. is a 76 y.o. male last seen in September and referred for a follow-up diagnostic cardiac catheterization for reevaluation of coronary anatomy given concerns about progressive angina on medical therapy.  Fortunately, procedure revealed patent stent sites within the LAD, small caliber jailed diagonal at LAD stent site with good flow, patent stent site within the OM system, and patent stent sites within the RCA.  No substantial in-stent restenosis was described and medical therapy was recommended.  He is here today for follow-up, reassured by findings.  Has returned to playing golf regularly.  We went over his medications which are stable and outlined below.  No changes were made today.  Past Medical History:  Diagnosis Date   Arthritis    CAD (coronary artery disease)    a. 05/19/13: DES x 2 to RCA, staged DES to OM 05/22/13, residual LAD/diag disease for Rx mgmt  b. 05/20/14 [place] s/p DES to LAD   CKD (chronic kidney disease), stage III (HCC)    Essential hypertension    GERD (gastroesophageal reflux disease)    Thrombocytopenia (HCC)     Past Surgical History:  Procedure Laterality Date   Bilateral hip replacements     CARDIAC CATHETERIZATION     COLONOSCOPY W/ POLYPECTOMY  05/2016   COLON POLYPS REMOVED(>3) [facility]   CORONARY ANGIOPLASTY WITH STENT PLACEMENT  05/20/2014   mid lad    CORONARY STENT PLACEMENT  05/19/13   FRACTIONAL FLOW RESERVE WIRE Right 05/22/2013   Procedure: FRACTIONAL FLOW RESERVE WIRE;  Surgeon: Kathleene Hazel, MD;  Location: [facility] CATH LAB;  Service: Cardiovascular;  Laterality: Right;   LEFT HEART CATH AND CORONARY ANGIOGRAPHY N/A 11/25/2021   Procedure:  LEFT HEART CATH AND CORONARY ANGIOGRAPHY;  Surgeon: Kathleene Hazel, MD;  Location: [facility] INVASIVE CV LAB;  Service: Cardiovascular;  Laterality: N/A;   LEFT HEART CATHETERIZATION WITH CORONARY ANGIOGRAM N/A 05/19/2013   Procedure: LEFT HEART CATHETERIZATION WITH CORONARY ANGIOGRAM;  Surgeon: Lennette Bihari, MD;  Location: [facility] CATH LAB;  Service: Cardiovascular;  Laterality: N/A;   LEFT HEART CATHETERIZATION WITH CORONARY ANGIOGRAM N/A 05/20/2014   Procedure: LEFT HEART CATHETERIZATION WITH CORONARY ANGIOGRAM;  Surgeon: Kathleene Hazel, MD;  Location: [facility] CATH LAB;  Service: Cardiovascular;  Laterality: N/A;   PERCUTANEOUS CORONARY STENT INTERVENTION (PCI-S) N/A 05/22/2013   Procedure: PERCUTANEOUS CORONARY STENT INTERVENTION (PCI-S);  Surgeon: Kathleene Hazel, MD;  Location: [facility] CATH LAB;  Service: Cardiovascular;  Laterality: N/A;    Current Outpatient Medications  Medication Sig Dispense Refill   aspirin EC 81 MG tablet Take 81 mg by mouth daily.     atorvastatin (LIPITOR) 40 MG tablet Take 1 tablet (40 mg total) by mouth every evening. 30 tablet 6   Cholecalciferol (VITAMIN D3) 25 MCG (1000 UT) CAPS Take 1,000 Units by mouth daily.     lidocaine (LIDODERM) 5 % Place 1 patch onto the skin daily as needed (shoulder pain). Remove & Discard patch within 12 hours or as directed by MD     lisinopril-hydrochlorothiazide (PRINZIDE,ZESTORETIC) 20-12.5 MG tablet TAKE 1 TABLET DAILY 90 tablet 1   metoprolol tartrate (LOPRESSOR) 25 MG tablet Take 25 mg by mouth 2 (two)  times daily.     nitroGLYCERIN (NITROSTAT) 0.4 MG SL tablet Place 1 tablet (0.4 mg total) under the tongue every 5 (five) minutes x 3 doses as needed for chest pain (if no relief after 2nd dose, proceed to the ED for an evaluation or call 911). 25 tablet 3   omeprazole (PRILOSEC) 40 MG capsule Take 1 capsule (40 mg total) by mouth at bedtime. 30 capsule 6   ticagrelor (BRILINTA) 60 MG TABS tablet Take 1 tablet (60 mg total) by  mouth 2 (two) times daily. 180 each 6   vitamin B-12 (CYANOCOBALAMIN) 1000 MCG tablet Take 1,000 mcg by mouth daily.     Current Facility-Administered Medications  Medication Dose Route Frequency Provider Last Rate Last Admin   sodium chloride flush (NS) 0.9 % injection 3 mL  3 mL Intravenous Q12H Satira Sark, MD       Allergies:  Patient has no known allergies.   ROS: No palpitations or syncope.  Physical Exam: VS:  BP 114/68   Pulse (!) 55   Ht 5\' 5"  (1.651 m)   Wt 155 lb 3.2 oz (70.4 kg)   SpO2 99%   BMI 25.83 kg/m , BMI Body mass index is 25.83 kg/m.  Wt Readings from Last 3 Encounters:  12/17/21 155 lb 3.2 oz (70.4 kg)  11/25/21 141 lb (64 kg)  11/11/21 152 lb 12.8 oz (69.3 kg)    General: Patient appears comfortable at rest. HEENT: Conjunctiva and lids normal. Lungs: Clear to auscultation, nonlabored breathing at rest. Cardiac: Regular rate and rhythm, no S3 or significant systolic murmur. Extremities: No pitting edema, well-healed right radial arteriotomy site.  ECG:  An ECG dated 11/11/2021 was personally reviewed today and demonstrated:  Sinus rhythm.  Recent Labwork: October 2022: Cholesterol 99, triglycerides 85, HDL 32, LDL 50 12/29/2020: ALT 16; AST 20 11/22/2021: BUN 15; Creat 1.66; Hemoglobin 13.5; Platelets 133; Potassium 4.0; Sodium 139    Other Studies Reviewed Today:  Cardiac catheterization 11/25/2021:   Prox RCA lesion is 50% stenosed.   Mid RCA to Dist RCA lesion is 20% stenosed.   2nd Mrg-1 lesion is 20% stenosed.   2nd Mrg-2 lesion is 50% stenosed.   1st Diag lesion is 99% stenosed.   Dist LAD lesion is 40% stenosed.   Previously placed Prox LAD to Mid LAD stent of unknown type is  widely patent.   The LAD is a large caliber vessel that courses to the apex. The proximal to mid LAD stented segment is patent with no restenosis. Small caliber diagonal jailed by the LAD stent with good flow.  The Circumflex has a patent stent in the obtuse  marginal branch with minimal restenosis.  The RCA is a dominant vessel with patent mid to distal stented segment with minimal restenosis.  LVEDP 6 mmHg   Recommendations: Continue medical management of CAD  Assessment and Plan:  1.  CAD status post DES x2 to the RCA and staged DES to the OM in 2015 with subsequent DES to the LAD in 2016.  Recent follow-up cardiac catheterization shows patent stent sites and plan for medical therapy.  Continue aspirin, Brilinta, Lopressor, Prinzide, Lipitor, and as needed nitroglycerin.  2.  Essential hypertension, blood pressure is well controlled today.  No changes made to current regimen.  3.  CKD stage IIIb, creatinine 1.66.  Medication Adjustments/Labs and Tests Ordered: Current medicines are reviewed at length with the patient today.  Concerns regarding medicines are outlined above.   Tests Ordered:  No orders of the defined types were placed in this encounter.   Medication Changes: No orders of the defined types were placed in this encounter.   Disposition:  Follow up  6 months.  Signed, Satira Sark, MD, [facility] 12/17/2021 11:11 AM    [place] at [place], [place], [place] [address] Phone: [phone]; Fax: [phone]

## 2021-12-28 DIAGNOSIS — Z7189 Other specified counseling: Secondary | ICD-10-CM | POA: Diagnosis not present

## 2021-12-28 DIAGNOSIS — Z6825 Body mass index (BMI) 25.0-25.9, adult: Secondary | ICD-10-CM | POA: Diagnosis not present

## 2021-12-28 DIAGNOSIS — Z1331 Encounter for screening for depression: Secondary | ICD-10-CM | POA: Diagnosis not present

## 2021-12-28 DIAGNOSIS — R5383 Other fatigue: Secondary | ICD-10-CM | POA: Diagnosis not present

## 2021-12-28 DIAGNOSIS — E7800 Pure hypercholesterolemia, unspecified: Secondary | ICD-10-CM | POA: Diagnosis not present

## 2021-12-28 DIAGNOSIS — Z299 Encounter for prophylactic measures, unspecified: Secondary | ICD-10-CM | POA: Diagnosis not present

## 2021-12-28 DIAGNOSIS — Z1339 Encounter for screening examination for other mental health and behavioral disorders: Secondary | ICD-10-CM | POA: Diagnosis not present

## 2021-12-28 DIAGNOSIS — I10 Essential (primary) hypertension: Secondary | ICD-10-CM | POA: Diagnosis not present

## 2021-12-28 DIAGNOSIS — Z0000 Encounter for general adult medical examination without abnormal findings: Secondary | ICD-10-CM | POA: Diagnosis not present

## 2021-12-28 DIAGNOSIS — J069 Acute upper respiratory infection, unspecified: Secondary | ICD-10-CM | POA: Diagnosis not present

## 2022-01-01 DIAGNOSIS — Z20822 Contact with and (suspected) exposure to covid-19: Secondary | ICD-10-CM | POA: Diagnosis not present

## 2022-01-04 ENCOUNTER — Inpatient Hospital Stay: Payer: Medicare Other

## 2022-01-04 DIAGNOSIS — R5383 Other fatigue: Secondary | ICD-10-CM | POA: Diagnosis not present

## 2022-01-04 DIAGNOSIS — E7800 Pure hypercholesterolemia, unspecified: Secondary | ICD-10-CM | POA: Diagnosis not present

## 2022-01-04 DIAGNOSIS — Z125 Encounter for screening for malignant neoplasm of prostate: Secondary | ICD-10-CM | POA: Diagnosis not present

## 2022-01-04 DIAGNOSIS — Z79899 Other long term (current) drug therapy: Secondary | ICD-10-CM | POA: Diagnosis not present

## 2022-01-11 ENCOUNTER — Ambulatory Visit: Payer: Medicare Other | Admitting: Physician Assistant

## 2022-05-20 ENCOUNTER — Other Ambulatory Visit: Payer: Self-pay | Admitting: Cardiology

## 2022-07-06 DIAGNOSIS — R059 Cough, unspecified: Secondary | ICD-10-CM | POA: Diagnosis not present

## 2022-07-06 DIAGNOSIS — Z299 Encounter for prophylactic measures, unspecified: Secondary | ICD-10-CM | POA: Diagnosis not present

## 2022-07-06 DIAGNOSIS — J302 Other seasonal allergic rhinitis: Secondary | ICD-10-CM | POA: Diagnosis not present

## 2022-07-06 NOTE — Progress Notes (Unsigned)
    Cardiology Office Note  Date: 07/07/2022   ID: Thurmon Greeney., DOB Jan 09, 1946, MRN [id number]  History of Present Illness: Keith Keith. is a 77 y.o. male last seen in October 2023.  He is here for a routine visit.  Reports no angina or nitroglycerin use in the interim.  Still playing golf regularly.  Recent sinus congestion and follow-up with PCP.  He also goes to the [facility], had recent lab work which he will bring by for review later.  I reviewed his medications, no changes from a cardiac perspective.  No spontaneous bleeding problems on aspirin and Brilinta.  Physical Exam: VS:  BP 128/70   Pulse 71   Ht 5' 5.5" (1.664 m)   Wt 148 lb 12.8 oz (67.5 kg)   SpO2 94%   BMI 24.39 kg/m , BMI Body mass index is 24.39 kg/m.  Wt Readings from Last 3 Encounters:  07/07/22 148 lb 12.8 oz (67.5 kg)  12/17/21 155 lb 3.2 oz (70.4 kg)  11/25/21 141 lb (64 kg)    General: Patient appears comfortable at rest. HEENT: Conjunctiva and lids normal. Neck: Supple, no elevated JVP or carotid bruits. Lungs: Clear to auscultation, nonlabored breathing at rest. Cardiac: Regular rate and rhythm, no S3 or significant systolic murmur. Extremities: No pitting edema.  ECG:  An ECG dated 11/11/2021 was personally reviewed today and demonstrated:  Sinus rhythm.  Labwork: 11/22/2021: BUN 15; Creat 1.66; Hemoglobin 13.5; Platelets 133; Potassium 4.0; Sodium 139  November 2023: Cholesterol 105, triglycerides 61, HDL 36, LDL 55, TSH 0.906, hemoglobin 13.5, platelets 180  Other Studies Reviewed Today:  No interval cardiac testing for review today.  Assessment and Plan:  1.  Multivessel CAD status post DES x 2 to the RCA with staged DES to the OM in 2015, subsequently DES to the LAD in 2016.  Cardiac catheterization in October 2023 revealed patent stent sites with small caliber diagonal jailed by the LAD stent but with good flow.  No targets for revascularization at that time and medical  therapy has been continued.  LVEF normal by assessment over time.  He is doing well without active angina at this time.  Continue current regimen including aspirin, Brilinta, Lopressor, Prinzide, Lipitor, and as needed nitroglycerin.  2.  Essential hypertension.  Blood pressure is adequately controlled today.  No changes were made.  3.  Mixed hyperlipidemia.  Last LDL 55 on Lipitor 40 mg daily.  4.  CKD stage IIIb, creatinine 1.66 in October 2023.  Disposition:  Follow up  6 months.  Signed, Jonelle Sidle, M.D., F.A.C.C. [facility] at [facility]

## 2022-07-07 ENCOUNTER — Encounter: Payer: Self-pay | Admitting: Cardiology

## 2022-07-07 ENCOUNTER — Ambulatory Visit: Payer: Medicare Other | Attending: Cardiology | Admitting: Cardiology

## 2022-07-07 VITALS — BP 128/70 | HR 71 | Ht 65.5 in | Wt 148.8 lb

## 2022-07-07 DIAGNOSIS — N1832 Chronic kidney disease, stage 3b: Secondary | ICD-10-CM | POA: Diagnosis not present

## 2022-07-07 DIAGNOSIS — I10 Essential (primary) hypertension: Secondary | ICD-10-CM | POA: Diagnosis not present

## 2022-07-07 DIAGNOSIS — E782 Mixed hyperlipidemia: Secondary | ICD-10-CM | POA: Diagnosis not present

## 2022-07-07 DIAGNOSIS — I25119 Atherosclerotic heart disease of native coronary artery with unspecified angina pectoris: Secondary | ICD-10-CM | POA: Insufficient documentation

## 2022-07-07 NOTE — Patient Instructions (Addendum)

## 2022-07-25 DIAGNOSIS — Z7982 Long term (current) use of aspirin: Secondary | ICD-10-CM | POA: Diagnosis not present

## 2022-07-25 DIAGNOSIS — Z87891 Personal history of nicotine dependence: Secondary | ICD-10-CM | POA: Diagnosis not present

## 2022-07-25 DIAGNOSIS — R040 Epistaxis: Secondary | ICD-10-CM | POA: Diagnosis not present

## 2022-07-25 DIAGNOSIS — I10 Essential (primary) hypertension: Secondary | ICD-10-CM | POA: Diagnosis not present

## 2022-07-25 DIAGNOSIS — Z79899 Other long term (current) drug therapy: Secondary | ICD-10-CM | POA: Diagnosis not present

## 2022-08-08 ENCOUNTER — Telehealth: Payer: Self-pay | Admitting: Cardiology

## 2022-08-08 DIAGNOSIS — I10 Essential (primary) hypertension: Secondary | ICD-10-CM | POA: Diagnosis not present

## 2022-08-08 DIAGNOSIS — I25119 Atherosclerotic heart disease of native coronary artery with unspecified angina pectoris: Secondary | ICD-10-CM | POA: Diagnosis not present

## 2022-08-08 DIAGNOSIS — Z299 Encounter for prophylactic measures, unspecified: Secondary | ICD-10-CM | POA: Diagnosis not present

## 2022-08-08 DIAGNOSIS — R040 Epistaxis: Secondary | ICD-10-CM | POA: Diagnosis not present

## 2022-08-08 NOTE — Telephone Encounter (Signed)
Patient has had a nosebleed on 07-25-2022 went to the ER at [facility]. Was told to follow up with Dr. Diona Browner. Patient was concerned about coming off of his ticagrelor (BRILINTA) 60 mg Tab .

## 2022-08-08 NOTE — Telephone Encounter (Signed)
Spoke with wife Keith Keith since patient was not at home, who says patient had a nose bleed this morning that bled for 15 minutes then it stopped. Says he also went to [facility] ED recently for his nosebleed. Says his PCP advised him to contact his cardiologist about stopping brilinta. Denies active nose bleeding. Advised that brilinta wouldn't cause the nose bleeding but could make the bleeding worse. Advised he could hold brilinta for a couple of days if his nosebleed returns. Advised he needed to contact his PCP for an ENT referral. Verbalized understanding of plan.

## 2022-08-12 NOTE — Telephone Encounter (Signed)
Per DPR, can leave detailed message on phone voicemail:  Advised pt of providers recommendation to stop asa, but continue Brilinta if nosebleeds become a recurring issue. Advised pt to call office for any further questions or concerns.

## 2023-01-17 ENCOUNTER — Encounter: Payer: Self-pay | Admitting: Cardiology

## 2023-01-17 ENCOUNTER — Ambulatory Visit: Payer: Medicare Other | Attending: Cardiology | Admitting: Cardiology

## 2023-01-17 VITALS — BP 136/68 | HR 64 | Ht 66.0 in | Wt 150.4 lb

## 2023-01-17 DIAGNOSIS — I10 Essential (primary) hypertension: Secondary | ICD-10-CM | POA: Diagnosis not present

## 2023-01-17 DIAGNOSIS — I25119 Atherosclerotic heart disease of native coronary artery with unspecified angina pectoris: Secondary | ICD-10-CM | POA: Insufficient documentation

## 2023-01-17 DIAGNOSIS — E782 Mixed hyperlipidemia: Secondary | ICD-10-CM | POA: Diagnosis not present

## 2023-01-17 DIAGNOSIS — N1832 Chronic kidney disease, stage 3b: Secondary | ICD-10-CM | POA: Diagnosis not present

## 2023-01-17 MED ORDER — NITROGLYCERIN 0.4 MG SL SUBL: 0.4000 mg | SUBLINGUAL_TABLET | SUBLINGUAL | 3 refills | Status: DC | PRN

## 2023-01-17 MED ORDER — NITROGLYCERIN 0.4 MG SL SUBL: 0.4000 mg | SUBLINGUAL_TABLET | SUBLINGUAL | 3 refills | Status: AC | PRN

## 2023-01-17 NOTE — Patient Instructions (Addendum)

## 2023-01-17 NOTE — Progress Notes (Signed)
    Cardiology Office Note  Date: 01/17/2023   ID: Elmer Fichtel., DOB 09-30-1945, MRN [id number]  History of Present Illness: Keith Keith. is a 77 y.o. male last seen in May.  He is here for a follow-up visit.  He does not report any definite angina, no nitroglycerin use.  Remains functional with ADLs and continues to play golf regularly.  Reviewed his medications.  Current cardiac regimen includes Brilinta, Lopressor, Prinzide, Lipitor, and as needed nitroglycerin.  We took him off aspirin given nosebleeds, this has not been a recurring problem.  I reviewed his ECG today which shows sinus bradycardia.  Physical Exam: VS:  BP 136/68 (BP Location: Right Arm)   Pulse 64   Ht 5\' 6"  (1.676 m)   Wt 150 lb 6.4 oz (68.2 kg)   SpO2 99%   BMI 24.28 kg/m , BMI Body mass index is 24.28 kg/m.  Wt Readings from Last 3 Encounters:  01/17/23 150 lb 6.4 oz (68.2 kg)  07/07/22 148 lb 12.8 oz (67.5 kg)  12/17/21 155 lb 3.2 oz (70.4 kg)    General: Patient appears comfortable at rest. HEENT: Conjunctiva and lids normal. Neck: Supple, no elevated JVP or carotid bruits. Lungs: Clear to auscultation, nonlabored breathing at rest. Cardiac: Regular rate and rhythm, no S3 or significant systolic murmur. Extremities: No pitting edema.  ECG:  An ECG dated 11/11/2021 was personally reviewed today and demonstrated:  Sinus rhythm.  Labwork:  11/22/2021: BUN 15; Creat 1.66; Hemoglobin 13.5; Platelets 133; Potassium 4.0; Sodium 139  November 2023: Cholesterol 105, triglycerides 61, HDL 36, LDL 55, TSH 0.906, hemoglobin 13.5, platelets 180 June 2024: Hemoglobin 12.0, platelets 118  Other Studies Reviewed Today:  No interval cardiac testing for review today.  Assessment and Plan:  1.  Multivessel CAD status post DES x 2 to the RCA with staged DES to the OM in 2015, subsequently DES to the LAD in 2016.  Cardiac catheterization in October 2023 revealed patent stent sites with small caliber  diagonal jailed by the LAD stent but with good flow.  No targets for revascularization at that time and medical therapy has been continued.  LVEF normal by assessment over time.  He is symptomatically stable at this time on medical therapy.  ECG reviewed and stable.  Continue Brilinta, Lopressor, Lipitor, and as needed nitroglycerin which will be refilled.   2.  Primary hypertension.  He tracks blood pressure at home with systolics generally 130 and under.  No changes made today, continue Prinzide as well.   3.  Mixed hyperlipidemia.  Last LDL 55 on Lipitor 40 mg daily.   4.  CKD stage IIIb, creatinine 1.66 in October 2023.  Disposition:  Follow up  6 months.  Signed, Jonelle Sidle, M.D., F.A.C.C. [facility] at [facility]

## 2023-02-02 DIAGNOSIS — Z1339 Encounter for screening examination for other mental health and behavioral disorders: Secondary | ICD-10-CM | POA: Diagnosis not present

## 2023-02-02 DIAGNOSIS — Z7189 Other specified counseling: Secondary | ICD-10-CM | POA: Diagnosis not present

## 2023-02-02 DIAGNOSIS — Z79899 Other long term (current) drug therapy: Secondary | ICD-10-CM | POA: Diagnosis not present

## 2023-02-02 DIAGNOSIS — E7800 Pure hypercholesterolemia, unspecified: Secondary | ICD-10-CM | POA: Diagnosis not present

## 2023-02-02 DIAGNOSIS — Z1331 Encounter for screening for depression: Secondary | ICD-10-CM | POA: Diagnosis not present

## 2023-02-02 DIAGNOSIS — Z87891 Personal history of nicotine dependence: Secondary | ICD-10-CM | POA: Diagnosis not present

## 2023-02-02 DIAGNOSIS — I10 Essential (primary) hypertension: Secondary | ICD-10-CM | POA: Diagnosis not present

## 2023-02-02 DIAGNOSIS — Z299 Encounter for prophylactic measures, unspecified: Secondary | ICD-10-CM | POA: Diagnosis not present

## 2023-02-02 DIAGNOSIS — Z0000 Encounter for general adult medical examination without abnormal findings: Secondary | ICD-10-CM | POA: Diagnosis not present

## 2023-02-02 DIAGNOSIS — R5383 Other fatigue: Secondary | ICD-10-CM | POA: Diagnosis not present

## 2023-02-07 DIAGNOSIS — Z79899 Other long term (current) drug therapy: Secondary | ICD-10-CM | POA: Diagnosis not present

## 2023-02-07 DIAGNOSIS — Z125 Encounter for screening for malignant neoplasm of prostate: Secondary | ICD-10-CM | POA: Diagnosis not present

## 2023-02-07 DIAGNOSIS — E7800 Pure hypercholesterolemia, unspecified: Secondary | ICD-10-CM | POA: Diagnosis not present

## 2023-02-07 DIAGNOSIS — R5383 Other fatigue: Secondary | ICD-10-CM | POA: Diagnosis not present

## 2023-05-30 ENCOUNTER — Other Ambulatory Visit: Payer: Self-pay | Admitting: Cardiology

## 2023-07-04 ENCOUNTER — Encounter: Payer: Self-pay | Admitting: Cardiology

## 2023-07-04 ENCOUNTER — Ambulatory Visit: Payer: Medicare Other | Attending: Cardiology | Admitting: Cardiology

## 2023-07-04 VITALS — BP 128/72 | HR 55 | Ht 65.0 in | Wt 150.6 lb

## 2023-07-04 DIAGNOSIS — E782 Mixed hyperlipidemia: Secondary | ICD-10-CM | POA: Insufficient documentation

## 2023-07-04 DIAGNOSIS — I10 Essential (primary) hypertension: Secondary | ICD-10-CM | POA: Insufficient documentation

## 2023-07-04 DIAGNOSIS — N1832 Chronic kidney disease, stage 3b: Secondary | ICD-10-CM | POA: Diagnosis not present

## 2023-07-04 DIAGNOSIS — I25119 Atherosclerotic heart disease of native coronary artery with unspecified angina pectoris: Secondary | ICD-10-CM | POA: Diagnosis not present

## 2023-07-04 NOTE — Patient Instructions (Signed)

## 2023-07-04 NOTE — Progress Notes (Signed)
    Cardiology Office Note  Date: 07/04/2023   ID: Keith Sokoloff., DOB 1945/08/17, MRN [id number]  History of Present Illness: Keith Medd. is a 78 y.o. male last seen in November 2024.  He is here for a routine visit.  He does not report any accelerating angina or interval nitroglycerin  use.  He plays golf Mondays, Wednesdays, and Fridays.  No increasing breathlessness with activity, no palpitations or syncope.  We went over his medications.  No change in cardiac regimen.  He does not report any spontaneous bleeding problems on Brilinta .  I did review his interval lab work, LDL was 62 in December 2024.  Renal function has been relatively stable.  Physical Exam: VS:  BP 128/72 (BP Location: Left Arm)   Pulse (!) 55   Ht 5\' 5"  (1.651 m)   Wt 150 lb 9.6 oz (68.3 kg)   SpO2 98%   BMI 25.06 kg/m , BMI Body mass index is 25.06 kg/m.  Wt Readings from Last 3 Encounters:  07/04/23 150 lb 9.6 oz (68.3 kg)  01/17/23 150 lb 6.4 oz (68.2 kg)  07/07/22 148 lb 12.8 oz (67.5 kg)    General: Patient appears comfortable at rest. HEENT: Conjunctiva and lids normal. Neck: Supple, no elevated JVP or carotid bruits. Lungs: Clear to auscultation, nonlabored breathing at rest. Cardiac: Regular rate and rhythm, no S3 or significant systolic murmur. Extremities: No pitting edema.  ECG:  An ECG dated 01/17/2023 was personally reviewed today and demonstrated:  Sinus bradycardia.  Labwork:  December 2024: Hemoglobin 12, platelets 154, BUN 15, creatinine 1.7, GFR 41, potassium 4.2, AST 18, ALT 14, cholesterol 113, triglycerides 81, HDL 35, LDL 62, TSH 0.827  Other Studies Reviewed Today:  No interval cardiac testing for review today.  Assessment and Plan:  1.  Multivessel CAD status post DES x 2 to the RCA with staged DES to the OM in 2015, subsequently DES to the LAD in 2016.  Cardiac catheterization in October 2023 revealed patent stent sites with small caliber diagonal jailed by the LAD  stent but with good flow.  No targets for revascularization at that time and medical therapy has been continued.  LVEF normal by assessment over time.  He continues to do well without increasing angina or interval nitroglycerin  use.  No specific change in stamina, he continues to play golf regularly.  Continue Brilinta  60 mg twice daily, Lopressor  25 mg twice daily, Lipitor  40 mg daily, and as needed nitroglycerin .   2.  Primary hypertension.  Continue Prinzide  20/12.5 mg daily.  He continues to track blood pressure at home.   3.  Mixed hyperlipidemia.  LDL 62 in December 2024.  Continue Lipitor  40 mg daily.   4.  CKD stage IIIb, creatinine 1.7 with GFR 41 in December 2024.  Disposition:  Follow up 6 months.  Signed, Keith Keith, M.D., F.A.C.C. [facility] at [facility]

## 2023-08-03 DIAGNOSIS — I2510 Atherosclerotic heart disease of native coronary artery without angina pectoris: Secondary | ICD-10-CM | POA: Diagnosis not present

## 2023-08-03 DIAGNOSIS — R52 Pain, unspecified: Secondary | ICD-10-CM | POA: Diagnosis not present

## 2023-08-03 DIAGNOSIS — N1832 Chronic kidney disease, stage 3b: Secondary | ICD-10-CM | POA: Diagnosis not present

## 2023-08-03 DIAGNOSIS — I10 Essential (primary) hypertension: Secondary | ICD-10-CM | POA: Diagnosis not present

## 2023-08-03 DIAGNOSIS — Z299 Encounter for prophylactic measures, unspecified: Secondary | ICD-10-CM | POA: Diagnosis not present

## 2023-08-16 DIAGNOSIS — N1832 Chronic kidney disease, stage 3b: Secondary | ICD-10-CM | POA: Diagnosis not present

## 2023-08-16 DIAGNOSIS — R52 Pain, unspecified: Secondary | ICD-10-CM | POA: Diagnosis not present

## 2023-08-16 DIAGNOSIS — M25511 Pain in right shoulder: Secondary | ICD-10-CM | POA: Diagnosis not present

## 2023-08-16 DIAGNOSIS — Z299 Encounter for prophylactic measures, unspecified: Secondary | ICD-10-CM | POA: Diagnosis not present

## 2023-08-16 DIAGNOSIS — I10 Essential (primary) hypertension: Secondary | ICD-10-CM | POA: Diagnosis not present

## 2023-08-16 DIAGNOSIS — M65312 Trigger thumb, left thumb: Secondary | ICD-10-CM | POA: Diagnosis not present

## 2023-11-02 ENCOUNTER — Emergency Department (HOSPITAL_COMMUNITY)
Admission: EM | Admit: 2023-11-02 | Discharge: 2023-11-03 | Disposition: A | Discharge status: Home / Self Care | Attending: Emergency Medicine | Admitting: Emergency Medicine

## 2023-11-02 ENCOUNTER — Emergency Department (HOSPITAL_COMMUNITY)

## 2023-11-02 ENCOUNTER — Other Ambulatory Visit: Payer: Self-pay

## 2023-11-02 ENCOUNTER — Encounter (HOSPITAL_COMMUNITY): Payer: Self-pay

## 2023-11-02 DIAGNOSIS — R079 Chest pain, unspecified: Secondary | ICD-10-CM | POA: Insufficient documentation

## 2023-11-02 DIAGNOSIS — M25512 Pain in left shoulder: Secondary | ICD-10-CM | POA: Insufficient documentation

## 2023-11-02 DIAGNOSIS — R0789 Other chest pain: Secondary | ICD-10-CM | POA: Diagnosis not present

## 2023-11-02 LAB — BASIC METABOLIC PANEL WITH GFR
Anion gap: 13 (ref 5–15)
BUN: 13 mg/dL (ref 8–23)
CO2: 24 mmol/L (ref 22–32)
Calcium: 9.3 mg/dL (ref 8.9–10.3)
Chloride: 99 mmol/L (ref 98–111)
Creatinine, Ser: 1.59 mg/dL — ABNORMAL HIGH (ref 0.61–1.24)
GFR, Estimated: 44 mL/min — ABNORMAL LOW (ref >60)
Glucose, Bld: 258 mg/dL — ABNORMAL HIGH (ref 70–99)
Potassium: 3.8 mmol/L (ref 3.5–5.1)
Sodium: 136 mmol/L (ref 135–145)

## 2023-11-02 LAB — CBC
HCT: 42.6 % (ref 39.0–52.0)
Hemoglobin: 13.8 g/dL (ref 13.0–17.0)
MCH: 28.9 pg (ref 26.0–34.0)
MCHC: 32.4 g/dL (ref 30.0–36.0)
MCV: 89.3 fL (ref 80.0–100.0)
Platelets: 116 K/uL — ABNORMAL LOW (ref 150–400)
RBC: 4.77 MIL/uL (ref 4.22–5.81)
RDW: 15.1 % (ref 11.5–15.5)
WBC: 6 K/uL (ref 4.0–10.5)
nRBC: 0 % (ref 0.0–0.2)

## 2023-11-02 LAB — TROPONIN I (HIGH SENSITIVITY)
Troponin I (High Sensitivity): 5 ng/L (ref <18)
Troponin I (High Sensitivity): 6 ng/L (ref <18)

## 2023-11-02 MED ORDER — ACETAMINOPHEN 500 MG PO TABS
1000.0000 mg | ORAL_TABLET | Freq: Once | ORAL | Status: AC
  Administered 2023-11-02: 1000 mg via ORAL
  Filled 2023-11-02: qty 2

## 2023-11-02 MED ORDER — OXYCODONE HCL 5 MG PO TABS
10.0000 mg | ORAL_TABLET | Freq: Once | ORAL | Status: AC
  Administered 2023-11-02: 10 mg via ORAL
  Filled 2023-11-02: qty 2

## 2023-11-02 MED ORDER — IOHEXOL 350 MG/ML SOLN
100.0000 mL | Freq: Once | INTRAVENOUS | Status: AC | PRN
  Administered 2023-11-02: 100 mL via INTRAVENOUS

## 2023-11-02 NOTE — ED Provider Notes (Signed)
  [place] EMERGENCY DEPARTMENT AT [facility] Provider Note   CSN: [id number] Arrival date & time: 11/02/23  2044     History Chief Complaint  Patient presents with   Shoulder Pain    HPI Keith Keith. is a 78 y.o. male presenting for chief complaint of shoulder pain. States that he is an avid golfer and tonight his left shoulder started becoming severe. No medications PTA.  Patient's recorded medical, surgical, social, medication list and allergies were reviewed in the Snapshot window as part of the initial history.   Review of Systems   Review of Systems  Physical Exam Updated Vital Signs BP (!) 180/89 (BP Location: Right Arm)   Pulse (!) 57   Temp 98.2 F (36.8 C) (Oral)   Resp 20   Ht 5' 5 (1.651 m)   Wt 65.8 kg   SpO2 100%   BMI 24.13 kg/m  Physical Exam   ED Course/ Medical Decision Making/ A&P    Procedures Procedures   Medications Ordered in ED Medications - No data to display  Medical Decision Making:   Keith Keith. is a 78 y.o. male who presented to the ED today with *** detailed above.    {crccomplexity:27900} Complete initial physical exam performed, notably the patient  was ***.    Reviewed and confirmed nursing documentation for past medical history, family history, social history.    Initial Assessment:   With the patient's presentation of ***, most likely diagnosis is ***. Other diagnoses were considered including (but not limited to) ***. These are considered less likely due to history of present illness and physical exam findings.   {crccopa:27899}  Initial Plan:  ***  ***Screening labs including CBC and Metabolic panel to evaluate for infectious or metabolic etiology of disease.  ***Urinalysis with reflex culture ordered to evaluate for UTI or relevant urologic/nephrologic pathology.  ***CXR to evaluate for structural/infectious intrathoracic pathology.  {crccardiactesting:32591::EKG to evaluate for cardiac  pathology} Objective evaluation as below reviewed   Initial Study Results:   Laboratory  All laboratory results reviewed without evidence of clinically relevant pathology.   ***Exceptions include: ***   ***EKG EKG was reviewed independently. Rate, rhythm, axis, intervals all examined and without medically relevant abnormality. ST segments without concerns for elevations.    Radiology:  All images reviewed independently. ***Agree with radiology report at this time.   No results found.    Consults: Case discussed with ***.   Reassessment and Plan:   ***    ***  Clinical Impression: No diagnosis found.   Data Unavailable   Final Clinical Impression(s) / ED Diagnoses Final diagnoses:  None    Rx / DC Orders ED Discharge Orders     None

## 2023-11-02 NOTE — ED Triage Notes (Signed)
 Patient from home for bilateral shoulder pain that started 2-3 days ago. Patient also reports nausea that started today. Reports history of heart attacks in 2015 and 2016. Patient thought his pain may be related to bursitis history but states it feels different. Upon arrival to ER, patient is alert and oriented, ambu

## 2023-11-02 NOTE — ED Notes (Signed)
 Patient transported to CT

## 2023-11-03 DIAGNOSIS — I700 Atherosclerosis of aorta: Secondary | ICD-10-CM | POA: Diagnosis not present

## 2023-11-03 DIAGNOSIS — R110 Nausea: Secondary | ICD-10-CM | POA: Diagnosis not present

## 2023-11-03 DIAGNOSIS — M25511 Pain in right shoulder: Secondary | ICD-10-CM | POA: Diagnosis not present

## 2023-11-03 DIAGNOSIS — M25512 Pain in left shoulder: Secondary | ICD-10-CM | POA: Diagnosis not present
